# Patient Record
Sex: Female | Born: 1960
Health system: Southern US, Community
[De-identification: ages and names within clinical notes are randomized; demographics above are authoritative.]

## PROBLEM LIST (undated history)

## (undated) DIAGNOSIS — I10 Essential (primary) hypertension: Secondary | ICD-10-CM

## (undated) DIAGNOSIS — M1990 Unspecified osteoarthritis, unspecified site: Secondary | ICD-10-CM

## (undated) DIAGNOSIS — R609 Edema, unspecified: Secondary | ICD-10-CM

## (undated) DIAGNOSIS — Z923 Personal history of irradiation: Secondary | ICD-10-CM

## (undated) DIAGNOSIS — T7840XA Allergy, unspecified, initial encounter: Secondary | ICD-10-CM

## (undated) DIAGNOSIS — R011 Cardiac murmur, unspecified: Secondary | ICD-10-CM

## (undated) DIAGNOSIS — Z87442 Personal history of urinary calculi: Secondary | ICD-10-CM

## (undated) DIAGNOSIS — C801 Malignant (primary) neoplasm, unspecified: Secondary | ICD-10-CM

## (undated) DIAGNOSIS — F329 Major depressive disorder, single episode, unspecified: Secondary | ICD-10-CM

## (undated) DIAGNOSIS — Z8489 Family history of other specified conditions: Secondary | ICD-10-CM

## (undated) DIAGNOSIS — L982 Febrile neutrophilic dermatosis [Sweet]: Secondary | ICD-10-CM

## (undated) DIAGNOSIS — F32A Depression, unspecified: Secondary | ICD-10-CM

## (undated) HISTORY — DX: Essential (primary) hypertension: I10

## (undated) HISTORY — DX: Edema, unspecified: R60.9

## (undated) HISTORY — DX: Allergy, unspecified, initial encounter: T78.40XA

## (undated) HISTORY — DX: Depression, unspecified: F32.A

## (undated) HISTORY — DX: Unspecified osteoarthritis, unspecified site: M19.90

## (undated) HISTORY — PX: ABDOMINAL HYSTERECTOMY: SHX81

## (undated) HISTORY — DX: Malignant (primary) neoplasm, unspecified: C80.1

## (undated) HISTORY — PX: BREAST SURGERY: SHX581

## (undated) HISTORY — PX: APPENDECTOMY: SHX54

## (undated) HISTORY — DX: Major depressive disorder, single episode, unspecified: F32.9

---

## 2000-04-01 ENCOUNTER — Other Ambulatory Visit: Admission: RE | Admit: 2000-04-01 | Discharge: 2000-04-01 | Payer: Self-pay | Admitting: Obstetrics and Gynecology

## 2000-06-01 ENCOUNTER — Encounter: Payer: Self-pay | Admitting: Obstetrics and Gynecology

## 2000-06-07 ENCOUNTER — Inpatient Hospital Stay (HOSPITAL_COMMUNITY): Admission: RE | Admit: 2000-06-07 | Discharge: 2000-06-08 | Payer: Self-pay | Admitting: Obstetrics and Gynecology

## 2000-06-07 ENCOUNTER — Encounter (INDEPENDENT_AMBULATORY_CARE_PROVIDER_SITE_OTHER): Payer: Self-pay

## 2001-06-22 DIAGNOSIS — Z923 Personal history of irradiation: Secondary | ICD-10-CM

## 2001-06-22 HISTORY — PX: BREAST LUMPECTOMY: SHX2

## 2001-06-22 HISTORY — DX: Personal history of irradiation: Z92.3

## 2001-08-10 ENCOUNTER — Encounter: Admission: RE | Admit: 2001-08-10 | Discharge: 2001-08-10 | Payer: Self-pay

## 2001-08-12 ENCOUNTER — Encounter (INDEPENDENT_AMBULATORY_CARE_PROVIDER_SITE_OTHER): Payer: Self-pay | Admitting: *Deleted

## 2001-08-12 ENCOUNTER — Encounter: Admission: RE | Admit: 2001-08-12 | Discharge: 2001-08-12 | Payer: Self-pay

## 2001-08-12 ENCOUNTER — Ambulatory Visit (HOSPITAL_BASED_OUTPATIENT_CLINIC_OR_DEPARTMENT_OTHER): Admission: RE | Admit: 2001-08-12 | Discharge: 2001-08-12 | Payer: Self-pay

## 2001-08-22 ENCOUNTER — Ambulatory Visit: Admission: RE | Admit: 2001-08-22 | Discharge: 2001-11-20 | Payer: Self-pay | Admitting: *Deleted

## 2001-08-26 ENCOUNTER — Encounter (INDEPENDENT_AMBULATORY_CARE_PROVIDER_SITE_OTHER): Payer: Self-pay | Admitting: *Deleted

## 2001-08-26 ENCOUNTER — Other Ambulatory Visit: Admission: RE | Admit: 2001-08-26 | Discharge: 2001-08-26 | Payer: Self-pay | Admitting: *Deleted

## 2002-08-10 ENCOUNTER — Encounter: Payer: Self-pay | Admitting: Family Medicine

## 2002-08-10 ENCOUNTER — Encounter: Admission: RE | Admit: 2002-08-10 | Discharge: 2002-08-10 | Payer: Self-pay | Admitting: Family Medicine

## 2002-12-08 ENCOUNTER — Ambulatory Visit (HOSPITAL_BASED_OUTPATIENT_CLINIC_OR_DEPARTMENT_OTHER): Admission: RE | Admit: 2002-12-08 | Discharge: 2002-12-08 | Payer: Self-pay

## 2002-12-08 ENCOUNTER — Encounter (INDEPENDENT_AMBULATORY_CARE_PROVIDER_SITE_OTHER): Payer: Self-pay | Admitting: *Deleted

## 2003-08-31 ENCOUNTER — Other Ambulatory Visit: Admission: RE | Admit: 2003-08-31 | Discharge: 2003-08-31 | Payer: Self-pay | Admitting: Internal Medicine

## 2003-09-06 ENCOUNTER — Encounter: Admission: RE | Admit: 2003-09-06 | Discharge: 2003-09-06 | Payer: Self-pay | Admitting: Internal Medicine

## 2003-09-12 ENCOUNTER — Encounter: Admission: RE | Admit: 2003-09-12 | Discharge: 2003-09-12 | Payer: Self-pay | Admitting: Internal Medicine

## 2003-10-04 ENCOUNTER — Encounter: Admission: RE | Admit: 2003-10-04 | Discharge: 2003-10-04 | Payer: Self-pay | Admitting: Internal Medicine

## 2004-09-02 ENCOUNTER — Other Ambulatory Visit: Admission: RE | Admit: 2004-09-02 | Discharge: 2004-09-02 | Payer: Self-pay | Admitting: Internal Medicine

## 2004-10-13 ENCOUNTER — Encounter: Admission: RE | Admit: 2004-10-13 | Discharge: 2004-10-13 | Payer: Self-pay | Admitting: Internal Medicine

## 2004-10-27 ENCOUNTER — Encounter: Admission: RE | Admit: 2004-10-27 | Discharge: 2004-10-27 | Payer: Self-pay | Admitting: Internal Medicine

## 2005-10-01 ENCOUNTER — Other Ambulatory Visit: Admission: RE | Admit: 2005-10-01 | Discharge: 2005-10-01 | Payer: Self-pay | Admitting: Internal Medicine

## 2005-10-21 ENCOUNTER — Encounter: Admission: RE | Admit: 2005-10-21 | Discharge: 2005-10-21 | Payer: Self-pay | Admitting: Internal Medicine

## 2006-08-06 ENCOUNTER — Encounter: Admission: RE | Admit: 2006-08-06 | Discharge: 2006-08-06 | Payer: Self-pay | Admitting: Internal Medicine

## 2007-01-20 ENCOUNTER — Other Ambulatory Visit: Admission: RE | Admit: 2007-01-20 | Discharge: 2007-01-20 | Payer: Self-pay | Admitting: Internal Medicine

## 2007-03-15 ENCOUNTER — Encounter: Admission: RE | Admit: 2007-03-15 | Discharge: 2007-03-15 | Payer: Self-pay | Admitting: Internal Medicine

## 2007-10-25 ENCOUNTER — Encounter: Admission: RE | Admit: 2007-10-25 | Discharge: 2007-10-25 | Payer: Self-pay | Admitting: Internal Medicine

## 2008-03-15 ENCOUNTER — Encounter: Admission: RE | Admit: 2008-03-15 | Discharge: 2008-03-15 | Payer: Self-pay | Admitting: Internal Medicine

## 2008-03-19 ENCOUNTER — Other Ambulatory Visit: Admission: RE | Admit: 2008-03-19 | Discharge: 2008-03-19 | Payer: Self-pay | Admitting: Internal Medicine

## 2008-09-05 ENCOUNTER — Ambulatory Visit: Payer: Self-pay | Admitting: Internal Medicine

## 2008-10-02 ENCOUNTER — Ambulatory Visit: Payer: Self-pay | Admitting: Internal Medicine

## 2008-12-10 ENCOUNTER — Ambulatory Visit: Payer: Self-pay | Admitting: Internal Medicine

## 2009-03-18 ENCOUNTER — Encounter: Admission: RE | Admit: 2009-03-18 | Discharge: 2009-03-18 | Payer: Self-pay | Admitting: Internal Medicine

## 2009-04-11 ENCOUNTER — Ambulatory Visit: Payer: Self-pay | Admitting: Internal Medicine

## 2009-09-16 ENCOUNTER — Encounter: Admission: RE | Admit: 2009-09-16 | Discharge: 2009-09-16 | Payer: Self-pay | Admitting: Internal Medicine

## 2009-09-16 ENCOUNTER — Ambulatory Visit: Payer: Self-pay | Admitting: Internal Medicine

## 2009-09-17 ENCOUNTER — Encounter: Admission: RE | Admit: 2009-09-17 | Discharge: 2009-09-17 | Payer: Self-pay | Admitting: Internal Medicine

## 2010-01-21 ENCOUNTER — Ambulatory Visit: Payer: Self-pay | Admitting: Internal Medicine

## 2010-01-21 ENCOUNTER — Encounter: Payer: Self-pay | Admitting: Internal Medicine

## 2010-01-21 ENCOUNTER — Ambulatory Visit: Admission: RE | Admit: 2010-01-21 | Discharge: 2010-01-21 | Payer: Self-pay | Admitting: Internal Medicine

## 2010-01-21 ENCOUNTER — Ambulatory Visit: Payer: Self-pay | Admitting: Vascular Surgery

## 2010-02-20 HISTORY — PX: KNEE ARTHROSCOPY: SUR90

## 2010-06-02 ENCOUNTER — Ambulatory Visit: Payer: Self-pay | Admitting: Internal Medicine

## 2010-10-21 HISTORY — PX: LAPAROSCOPIC APPENDECTOMY: SUR753

## 2010-11-07 NOTE — Op Note (Signed)
[facility]  Patient:    Krystal Kane, Krystal Kane [facility]                        MRN: [id number] Proc. Date: 06/07/00 Adm. Date:  [id number] Attending:  Malon Kindle                           Operative Report  PREOPERATIVE DIAGNOSES:  Menorrhagia, dyspareunia, presumed adenomyosis.  POSTOPERATIVE DIAGNOSES:  Menorrhagia, dyspareunia, presumed adenomyosis.  OPERATION PERFORMED:  Vaginal hysterectomy.  SURGEON:  Dr. Ambrose Mantle.  ASSISTANT:  Dr. Jackelyn Knife.  ANESTHESIA:  General.  DESCRIPTION OF PROCEDURE:  The patient was brought to the operating room and placed under satisfactory general anesthesia and placed in lithotomy position. Exam revealed the uterus to be posterior normal size. The adnexa were free of masses. The uterus may have been upper limit of normal size. The lower abdomen, vulva, vagina, and urethra were prepped with Betadine solution. A Foley catheter was inserted to straight drain and then the vulva was draped as a sterile field. The cervix was drawn into the operative field. A dilute solution of Neo-Synephrine was injected all around the cervix at the cervicovaginal junction. An incision was made at this area. The bladder was pushed ahead and then the posterior cul-de-sac was identified and entered by sharp dissection. Both uterosacral ligaments were clamped, cut and suture ligated as were the cardinal ligaments. I advanced the bladder more, clamped, cut, and suture ligated the uterine vessels, entered the anterior peritoneal cavity, continued the bites along the broad ligaments bilaterally and then inverted the uterus through the incision in the cul-de-sac, clamped, the upper pedicles and removed the uterus. The upper pedicles were doubly suture ligated. A diligent search for hemostasis was made. Part of the bleeding was coming from the posterior cuff which I then sutured with three figure-of-eight sutures of #0 Vicryl.  There was only a small  amount of bleeding otherwise. I placed a pursestring suture through the anterior peritoneum, the upper pedicles, the uterosacral ligaments and the posterior peritoneum. Before I tied it down, I searched again for hemostasis. Both ovaries and tubes were normal. There were small areas of bleeding that I bovied I did require a couple of extra sutures along the broad ligaments. At this point, hemostasis was complete. I tied the pursestring suture down obliterating the peritoneal cavity. I then sutured the uterosacral ligaments together in the midline extraperitoneally, closed the vaginal cuff with multiple interrupted figure-of-eight sutures of #0 Vicryl in a vertical fashion. Blood loss was thought to be about 200 cc. Before the closure of the peritoneal cavity, I did inflate the bladder with 200 cc of methylene blue stone fluid, there was no leakage. The patient seemed to tolerate the procedure well and was returned to recovery in satisfactory condition. DD:  06/07/00 TD:  06/08/00 Job: [id number]

## 2010-11-07 NOTE — H&P (Signed)
[facility]  Patient:    Krystal Kane, Krystal Kane [facility]                        MRN: [id number] Adm. Date:  06/07/00 Attending:  Malachi Pro. Ambrose Mantle, M.D.                         History and Physical  HISTORY OF PRESENT ILLNESS:  This is a 50 year old white female, para 3-0-1-3, who is admitted to the hospital for hysterectomy because of menorrhagia, dyspareunia, and probable adenomyosis.  Last menstrual period was June 01, 2000.  Patients periods occur at 3-4 week intervals.  The last 3-5 days she claims they are heavier than in the past.  Changes pad, a super tampon every two hours the first day.  She states that her bleeding has been becoming heavier and she reports dyspareunia has increased over the last 6-7 months. On examination her uterus is tender to motion and somewhat enlarged.  The patient has tried birth control pills in the past, felt like they were related to her migraine headaches and yeast infections.  At the present time I have informed her that the goals of surgery are to eliminate the menorrhagia and hopefully to improve the dyspareunia.  I have spoken with the physician peer to peer for med cost and she has approved the surgery.  PAST MEDICAL HISTORY:  Reveals no known drug allergies.  OPERATIONS:  None.  ILLNESSES:  She has had a kidney stone.  She has been treated for high blood pressure.  She does not drink, does not smoke.  REVIEW OF SYSTEMS:  Noncontributory.  MEDICATIONS:  Hydrochlorothiazide.  FAMILY HISTORY:  Mother about 56 with breast cancer.  Father died at 39 from complications of a vasectomy.  Two sisters and two brothers are living and well.  PHYSICAL EXAMINATION:  GENERAL:  Reveals a well-developed slightly obese white female no distress, weight 153-1/2 pounds.  VITAL PRESSURE:  Blood pressure 120/80 and pulse 70.  HEENT:  Reveal no cranial abnormalities.  Extraocular movements are intact. Nose and pharynx clear.  NECK:   Neck supple without thyromegaly.  HEART:  Normal size and sounds, no murmurs.  LUNGS:  Clear to P&A.  BREASTS:  Soft without masses lying down or sitting up.  ABDOMEN:  Soft, nontender.  No masses are palpable.  The liver, spleen, and kidney are not felt.  PELVIC:  Reveals that on April 02, 2000, the patient had a normal Pap smear. The vulva and vagina are clean.  BUS negative, cervix clean, uterus third degree retroverted, upper limit of normal size, tender to motion.  Adnexae are clear.  ADMITTING IMPRESSION:  Menorrhagia, dyspareunia, probable adenomyosis.  The patient is admitted for hysterectomy.  We will try vaginally and if unsuccessful, go abdominally.  The patient has been informed of the risks for surgery, including, but not limited to, heart attacks, strokes, pulmonary embolus, wound disruption, hemorrhage with need for reoperation, and/or transfusion, fistula formation, nerve injury, and intestinal obstruction.  She understands and agrees to proceed. DD:  06/06/00 TD:  06/06/00 Job: [id number]

## 2010-11-07 NOTE — Op Note (Signed)
   NAME:  KEVIN, MARIO NO.:  [id number]   MEDICAL RECORD NO.:  [id number]                   PATIENT TYPE:  AMB   LOCATION:  [facility]SC                                  FACILITY:  [facility]   PHYSICIAN:  Lorre Munroe., M.D.            DATE OF BIRTH:  05-Mar-1961   DATE OF PROCEDURE:  12/08/2002  DATE OF DISCHARGE:  12/08/2002                                 OPERATIVE REPORT   PREOPERATIVE DIAGNOSIS:  Right breast mass.   POSTOPERATIVE DIAGNOSIS:  Right breast mass.   OPERATION:  Right breast biopsy.   SURGEON:  Lebron Conners, M.D.   ANESTHESIA:  Local with sedation.   DESCRIPTION OF PROCEDURE:  After the patient was    He says to discard this dictation because he looked in his record and saw  that he had dictation number [id number] and that needs to be found and placed on  the record and to let him know if it can not be located.                                               Lorre Munroe., M.D.    WB/MEDQ  D:  01/03/2003  T:  01/03/2003  Job:  [id number]

## 2010-11-07 NOTE — Discharge Summary (Signed)
[facility]  Patient:    Krystal Kane, Krystal Kane [facility]                        MRN: [id number] Adm. Date:  [id number] Disc. Date: [id number] Attending:  Malon Kindle                           Discharge Summary  REASON FOR ADMISSION:  A 50 year old white female with menorrhagia, dyspareunia, and presumed adenomyosis admitted for vaginal hysterectomy.  HOSPITAL COURSE:  The patient underwent the procedure by Dr. Ambrose Mantle with Dr. Jackelyn Knife assisting under general anesthesia on June 07, 2000.  Blood loss was about 200 cc.  One vaginal pack was left.  It was removed on the first postop day.  The patient did quite well.  She had no significant pain.  She was ambulating well without difficulty.  Her abdomen was soft and nontender, and on the first postop day she was discharged.  She did have a fever to 100.5 at 4 p.m. on the first day postop, but basically had no symptoms, and she is going to be monitored as an outpatient.  LABORATORY AND X-RAY DATA:  On admission her white count was 5500, hemoglobin 13.1, hematocrit 37.2, platelet count 274,000, 59 segs, 31, lymphs, 5 monos, 1 eosinophil.  Comprehensive metabolic profile was normal.  Urinalysis was negative.  Chest x-ray showed no active disease.  EKG was normal sinus rhythm, nonspecific ST and T wave abnormality.  Path report is not available.  FINAL DIAGNOSES: 1. Menorrhagia. 2. Dyspareunia. 3. Presumed adenomyosis.  MEDICATIONS AT DISCHARGE: 1. Tylenol No. 3 one q.3-4h. p.r.n. pain. 2. Allegra 60 mg one q.a.m.  Resume her other medications: 1. Hydrochlorothiazide 25 mg one p.o. q.a.m.  SPECIAL DISCHARGE INSTRUCTIONS:  She is to take her temperature at 8 p.m. tonight, at bedtime, and in the morning.  It should be noted that her temperature on her preop exam was 99.5 degrees.  She is encouraged to cough and deep breathe, to avoid vaginal entrance, and to call me on June 09, 2000, with a summary of  her temperatures.  No antibiotics are given at discharge. DD:  06/08/00 TD:  06/09/00 Job: [id number]

## 2010-11-07 NOTE — Op Note (Signed)
[facility]  Patient:    Krystal Kane, Krystal Kane Visit Number: [id number] MRN: [id number]          Service Type: DSU Location: [facility] Attending Physician:  Meredith Leeds Dictated by:   Zigmund Daniel, M.D. Proc. Date: 08/12/01 Admit Date:  08/12/2001                             Operative Report  PREOPERATIVE DIAGNOSIS:  Carcinoma of the right breast.  POSTOPERATIVE DIAGNOSIS:  Carcinoma of the right breast.  PROCEDURE:  Right partial mastectomy and right axillary sentinel lymph node biopsy.  SURGEON:  Zigmund Daniel, M.D.  ANESTHESIA:  General.  DESCRIPTION OF PROCEDURE:  After the patient was monitored and anesthetized with laryngeal mask airway and had routine preparation and draping of the right breast and axilla, I checked for radioactivity in the axilla.  I had injected 3 cc of blue dye beneath the nipple and areola prior to prep.  That had been massaged for five minutes.  I found a quite radioactive area in the axilla and made an incision about 2.5 cm long over it and dissected toward it and found three lymph nodes which were highly radioactive and stained blue. The remainder of the axilla then had background level of radiation.  I sent the lymph nodes for touch prep analysis and proceeded with the partial mastectomy during that period of time.  Subsequently I received the report that all of the touch preps were negative.  There were no palpably enlarged lymph nodes in the axilla.  I closed the skin with intracuticular 4-0 Vicryl and Steri-Strips.  I made a curvilinear incision at the site of wire entry and then more medial to it, since the wire was put in from medial to lateral, and dissected skin flaps over the area where I felt the tumor would be.  I then deepened the tissue all around, estimating that I would get at least a centimeter or two of normal breast margin all the way around.  I deepened the incision down to the  pectoral fascia and then began to excise the specimen. As I came across the specimen from inferior to superior, I found that the localizing wire went into the pectoral muscle.  I dissected off a little bit of the pectoral fascia and muscle with it and then removed it on out and completed the excision of the specimen.  I felt I had dissected definitely medial to the end of the localizing wire.  I then inspected the remaining cavity after getting good hemostasis and found one other somewhat suspicious area of tissue, and I excised that with a good margin of normal tissue.  I then again got hemostasis, irrigated the wound, and removed the irrigant.  I did not receive a report from the radiologist, as the machines that were faxing the radiograph to her were not working properly.  However, I inspected the radiograph myself and felt that the abnormality was excised.  The wound was closed with running intracuticular 4-0 Vicryl and Steri-Strips after getting good hemostasis.  Sponge, needle, and instrument counts were correct. Dictated by:   Zigmund Daniel, M.D. Attending Physician:  Meredith Leeds DD:  08/12/01 TD:  08/13/01 Job: [id number]

## 2010-11-07 NOTE — Op Note (Signed)
   NAME:  Krystal Kane, Krystal Kane NO.:  [id number]   MEDICAL RECORD NO.:  [id number]                   PATIENT TYPE:  AMB   LOCATION:  [facility]                                  FACILITY:  [facility]   PHYSICIAN:  Lorre Munroe., M.D.            DATE OF BIRTH:  1961-03-29   DATE OF PROCEDURE:  12/08/2002  DATE OF DISCHARGE:                                 OPERATIVE REPORT   PREOPERATIVE DIAGNOSIS:  Right breast mass and scar.   POSTOPERATIVE DIAGNOSIS:  Right breast mass and scar.   OPERATION PERFORMED:  Excision of mass and revision of scar.   SURGEON:  Lebron Conners, M.D.   ANESTHESIA:  Local with sedation.   DESCRIPTION OF PROCEDURE:  After the patient was monitored and sedated by  the anesthesia service and had routine preparation and draping of the right  breast I marked the scar and location of the mass effect deep in the scar  tissue.  I then made an elliptical excision of scar at maximum width about  0.5 cm.  I then mobilized the skin and subcutaneous tissues toward the  areola and toward the axilla going through a fair amount of scar tissue and  giving good mobility of the skin.  I then excised the deeper scar tissue  which had a mass-like effect particularly as it was present just superficial  to the pectoral muscle.  I actually took a little bit of the pectoral fascia  off along with the scar tissue.  I did not cut through or encounter any  obvious cancer.  After excising all of the mass, I mobilized the deeper  tissues slightly and then got good hemostasis.  I used one 4-0 Vicryl suture  to approximate the deeper tissues and eliminated a good deal of the dimple  type effect that had been present previous.  After assuring good hemostasis  and infiltrating further local anesthetic for good postoperative pain  control, I closed the skin with running intracuticular 4-0 Vicryl and Steri-  Strips.  The cosmetic effect was good.  After application of  bandage, the  patient went to the PACU in good condition.                                                Lorre Munroe., M.D.    WB/MEDQ  D:  12/08/2002  T:  12/11/2002  Job:  [id number]

## 2010-12-02 ENCOUNTER — Ambulatory Visit: Payer: Self-pay | Admitting: Internal Medicine

## 2010-12-11 ENCOUNTER — Other Ambulatory Visit: Payer: BC Managed Care – PPO | Admitting: Internal Medicine

## 2010-12-11 DIAGNOSIS — I10 Essential (primary) hypertension: Secondary | ICD-10-CM

## 2010-12-12 ENCOUNTER — Other Ambulatory Visit: Payer: BC Managed Care – PPO | Admitting: Internal Medicine

## 2010-12-12 LAB — BASIC METABOLIC PANEL WITH GFR
BUN: 15 mg/dL (ref 6–23)
CO2: 26 meq/L (ref 19–32)
Calcium: 9.2 mg/dL (ref 8.4–10.5)
Chloride: 105 meq/L (ref 96–112)
Creat: 0.76 mg/dL (ref 0.50–1.10)
Glucose, Bld: 92 mg/dL (ref 70–99)
Potassium: 4.2 meq/L (ref 3.5–5.3)
Sodium: 143 meq/L (ref 135–145)

## 2010-12-15 ENCOUNTER — Ambulatory Visit (INDEPENDENT_AMBULATORY_CARE_PROVIDER_SITE_OTHER): Payer: BC Managed Care – PPO | Admitting: Internal Medicine

## 2010-12-15 ENCOUNTER — Encounter: Payer: Self-pay | Admitting: Internal Medicine

## 2010-12-15 VITALS — BP 130/82 | HR 72 | Temp 97.7°F | Ht 63.25 in | Wt 205.0 lb

## 2010-12-15 DIAGNOSIS — F32A Depression, unspecified: Secondary | ICD-10-CM | POA: Insufficient documentation

## 2010-12-15 DIAGNOSIS — M778 Other enthesopathies, not elsewhere classified: Secondary | ICD-10-CM

## 2010-12-15 DIAGNOSIS — Z853 Personal history of malignant neoplasm of breast: Secondary | ICD-10-CM | POA: Insufficient documentation

## 2010-12-15 DIAGNOSIS — F3289 Other specified depressive episodes: Secondary | ICD-10-CM

## 2010-12-15 DIAGNOSIS — E669 Obesity, unspecified: Secondary | ICD-10-CM

## 2010-12-15 DIAGNOSIS — I10 Essential (primary) hypertension: Secondary | ICD-10-CM

## 2010-12-15 DIAGNOSIS — F329 Major depressive disorder, single episode, unspecified: Secondary | ICD-10-CM

## 2010-12-15 DIAGNOSIS — M65839 Other synovitis and tenosynovitis, unspecified forearm: Secondary | ICD-10-CM

## 2010-12-15 DIAGNOSIS — R635 Abnormal weight gain: Secondary | ICD-10-CM

## 2010-12-15 DIAGNOSIS — M25569 Pain in unspecified knee: Secondary | ICD-10-CM

## 2010-12-15 DIAGNOSIS — M25561 Pain in right knee: Secondary | ICD-10-CM

## 2010-12-15 NOTE — Patient Instructions (Signed)
Place ice on reassessed for 20 minutes twice daily. Wear wrist splint during the day. Please get mammogram. Continue same blood pressure medications. Return to [employer]. Try to lose 20 pounds in the next 6 months. May need to see orthopedist regarding reassessed. Recommend seeing orthopedist regarding persistent right knee pain.

## 2010-12-15 NOTE — Progress Notes (Signed)
  Subjective:    Patient ID: Krystal Kane, female    DOB: 1961/01/30, 50 y.o.   MRN: [id number]  HPI   In today for six-month recheck. She has gained 21 pounds since March 2011. Says that she had knee surgery for a right knee torn meniscus by Dr. Magnus Ivan in September 2011. Knee still hurts. Also complaining of right wrist pain after curing some heavy items to her mother's home a couple of months ago. She did not fall on her wrist just carried a lot of heavy items. Pain in medial aspect right wrist. Hurts to move thumb. Also, says blood pressure has been elevated. She had an appendectomy at [facility] location this past May. Her daughter also got married in early June. She's been under some stress with both things going on. Blood pressure today is acceptable on current regimen. The basic metabolic panel reviewed and is within normal limits. Some ENT nest syndrome going on with her. Tearful when discussing this. Middle daughter has graduated from high school and will be attending nursing school at  [employer]. Has one daughter age 52 left at home. Has not had recent mammogram as recommended. Order given today for mammogram. Advised patient to return to orthopedist regarding her knee and possibly her wrist.    Review of Systems     Objective:   Physical Exam neck supple without thyromegaly or carotid bruits; chest clear to auscultation; cardiac exam regular rate and rhythm; extremities without edema        Assessment & Plan:  Hypertension continue with Lasix and ramipril recheck in 6 months  Depression continue with Wellbutrin  Right wrist tendinitis-recommend ice wrist 20 minutes twice daily and wear right wrist splint during the day. Right knee pain status post surgery for torn meniscus  History of breast cancer-needs mammogram  Plan BMET reviewed and is within normal limits. Schedule physical examination January 2013. Patient agrees she should go back to [employer]. Agrees she should go back orthopedist regarding persistent right knee pain. May need to see orthopedist regarding right wrist if it does not respond to ice& wrist splint.

## 2010-12-19 ENCOUNTER — Other Ambulatory Visit: Payer: Self-pay | Admitting: Internal Medicine

## 2011-01-19 ENCOUNTER — Other Ambulatory Visit: Payer: Self-pay | Admitting: Internal Medicine

## 2011-01-19 DIAGNOSIS — Z1231 Encounter for screening mammogram for malignant neoplasm of breast: Secondary | ICD-10-CM

## 2011-01-28 ENCOUNTER — Ambulatory Visit: Payer: BC Managed Care – PPO

## 2011-02-04 ENCOUNTER — Ambulatory Visit
Admission: RE | Admit: 2011-02-04 | Discharge: 2011-02-04 | Disposition: A | Discharge status: Home / Self Care | Payer: BC Managed Care – PPO | Source: Ambulatory Visit | Attending: Internal Medicine | Admitting: Internal Medicine

## 2011-02-04 DIAGNOSIS — Z1231 Encounter for screening mammogram for malignant neoplasm of breast: Secondary | ICD-10-CM

## 2011-06-19 ENCOUNTER — Other Ambulatory Visit: Payer: Self-pay | Admitting: Internal Medicine

## 2011-07-06 ENCOUNTER — Other Ambulatory Visit: Payer: BC Managed Care – PPO | Admitting: Internal Medicine

## 2011-07-06 DIAGNOSIS — Z0000 Encounter for general adult medical examination without abnormal findings: Secondary | ICD-10-CM

## 2011-07-06 DIAGNOSIS — I10 Essential (primary) hypertension: Secondary | ICD-10-CM

## 2011-07-06 LAB — CBC WITH DIFFERENTIAL/PLATELET
Basophils Absolute: 0 10*3/uL (ref 0.0–0.1)
Basophils Relative: 0 % (ref 0–1)
Eosinophils Absolute: 0.1 10*3/uL (ref 0.0–0.7)
Eosinophils Relative: 1 % (ref 0–5)
HCT: 40.3 % (ref 36.0–46.0)
MCH: 31.4 pg (ref 26.0–34.0)
MCHC: 32.8 g/dL (ref 30.0–36.0)
MCV: 96 fL (ref 78.0–100.0)
Monocytes Absolute: 0.7 10*3/uL (ref 0.1–1.0)
Monocytes Relative: 7 % (ref 3–12)
Neutro Abs: 7.4 10*3/uL (ref 1.7–7.7)
RDW: 13.2 % (ref 11.5–15.5)

## 2011-07-06 LAB — LIPID PANEL
Cholesterol: 175 mg/dL (ref 0–200)
Total CHOL/HDL Ratio: 4.1 Ratio
Triglycerides: 149 mg/dL (ref <150)
VLDL: 30 mg/dL (ref 0–40)

## 2011-07-06 LAB — COMPREHENSIVE METABOLIC PANEL
AST: 20 U/L (ref 0–37)
Alkaline Phosphatase: 78 U/L (ref 39–117)
BUN: 10 mg/dL (ref 6–23)
Creat: 0.56 mg/dL (ref 0.50–1.10)
Glucose, Bld: 98 mg/dL (ref 70–99)

## 2011-07-07 ENCOUNTER — Encounter: Payer: Self-pay | Admitting: Internal Medicine

## 2011-07-07 ENCOUNTER — Ambulatory Visit (INDEPENDENT_AMBULATORY_CARE_PROVIDER_SITE_OTHER): Payer: BC Managed Care – PPO | Admitting: Internal Medicine

## 2011-07-07 ENCOUNTER — Other Ambulatory Visit (HOSPITAL_COMMUNITY)
Admission: RE | Admit: 2011-07-07 | Discharge: 2011-07-07 | Disposition: A | Discharge status: Home / Self Care | Payer: BC Managed Care – PPO | Source: Ambulatory Visit | Attending: Internal Medicine | Admitting: Internal Medicine

## 2011-07-07 VITALS — BP 146/94 | HR 80 | Temp 98.9°F | Ht 63.5 in | Wt 205.0 lb

## 2011-07-07 DIAGNOSIS — Z23 Encounter for immunization: Secondary | ICD-10-CM

## 2011-07-07 DIAGNOSIS — I10 Essential (primary) hypertension: Secondary | ICD-10-CM

## 2011-07-07 DIAGNOSIS — M1710 Unilateral primary osteoarthritis, unspecified knee: Secondary | ICD-10-CM

## 2011-07-07 DIAGNOSIS — F411 Generalized anxiety disorder: Secondary | ICD-10-CM

## 2011-07-07 DIAGNOSIS — Z01419 Encounter for gynecological examination (general) (routine) without abnormal findings: Secondary | ICD-10-CM | POA: Insufficient documentation

## 2011-07-07 DIAGNOSIS — Z0000 Encounter for general adult medical examination without abnormal findings: Secondary | ICD-10-CM

## 2011-07-07 DIAGNOSIS — F419 Anxiety disorder, unspecified: Secondary | ICD-10-CM

## 2011-07-07 DIAGNOSIS — M170 Bilateral primary osteoarthritis of knee: Secondary | ICD-10-CM

## 2011-07-07 DIAGNOSIS — Z124 Encounter for screening for malignant neoplasm of cervix: Secondary | ICD-10-CM

## 2011-07-07 DIAGNOSIS — Z87442 Personal history of urinary calculi: Secondary | ICD-10-CM

## 2011-07-07 DIAGNOSIS — Z853 Personal history of malignant neoplasm of breast: Secondary | ICD-10-CM

## 2011-07-07 DIAGNOSIS — Z8659 Personal history of other mental and behavioral disorders: Secondary | ICD-10-CM

## 2011-07-07 DIAGNOSIS — IMO0002 Reserved for concepts with insufficient information to code with codable children: Secondary | ICD-10-CM

## 2011-07-07 LAB — POCT URINALYSIS DIPSTICK
Glucose, UA: NEGATIVE
Leukocytes, UA: NEGATIVE
Protein, UA: NEGATIVE
Spec Grav, UA: <=1.005
Urobilinogen, UA: NEGATIVE

## 2011-07-07 LAB — VITAMIN D 25 HYDROXY (VIT D DEFICIENCY, FRACTURES): Vit D, 25-Hydroxy: 39 ng/mL (ref 30–89)

## 2011-07-07 MED ORDER — TETANUS-DIPHTH-ACELL PERTUSSIS 5-2.5-18.5 LF-MCG/0.5 IM SUSP: 0.5000 mL | Freq: Once | INTRAMUSCULAR | Status: DC

## 2011-07-14 ENCOUNTER — Ambulatory Visit (INDEPENDENT_AMBULATORY_CARE_PROVIDER_SITE_OTHER): Payer: BC Managed Care – PPO | Admitting: Internal Medicine

## 2011-07-14 ENCOUNTER — Encounter: Payer: Self-pay | Admitting: Internal Medicine

## 2011-07-14 VITALS — BP 144/92 | HR 84 | Temp 98.3°F | Wt 201.5 lb

## 2011-07-14 DIAGNOSIS — I10 Essential (primary) hypertension: Secondary | ICD-10-CM

## 2011-07-14 DIAGNOSIS — Z853 Personal history of malignant neoplasm of breast: Secondary | ICD-10-CM

## 2011-07-14 DIAGNOSIS — F329 Major depressive disorder, single episode, unspecified: Secondary | ICD-10-CM

## 2011-07-14 DIAGNOSIS — F419 Anxiety disorder, unspecified: Secondary | ICD-10-CM

## 2011-07-14 DIAGNOSIS — F411 Generalized anxiety disorder: Secondary | ICD-10-CM

## 2011-07-14 DIAGNOSIS — F32A Depression, unspecified: Secondary | ICD-10-CM

## 2011-07-21 ENCOUNTER — Encounter: Payer: Self-pay | Admitting: Internal Medicine

## 2011-07-21 NOTE — Patient Instructions (Signed)
Continue Ramapril 10 mg daily. Add Norvasc 5 mg daily. Return in 4 weeks.

## 2011-07-21 NOTE — Progress Notes (Signed)
  Subjective:    Patient ID: Krystal Kane, female    DOB: December 30, 1960, 51 y.o.   MRN: [id number]  HPI patient was in recently for health maintenance exam. Lately, says blood pressures been significantly elevated despite being on [place] real 10 mg daily. His been taking blood pressure readings at home. Is concerned about this. Denies excessive stress or anxiety. History of anxiety depression treated with Wellbutrin. No chest pain. History of breast cancer several years ago. Has done well since that time.    Review of Systems     Objective:   Physical Exam neck supple no thyromegaly; no JVD or bruits. Chest clear to auscultation; cardiac exam regular rate and rhythm normal S1 and S2 without murmurs. Extremities without edema        Assessment & Plan:  Hypertension-elevated blood pressure despite being on Ramapril 10 mg daily  Anxiety  History depression  History of breast cancer  Plan: Add Norvasc 5 mg daily and return in 4 weeks. Continue diet and exercise. Watch sodium.

## 2011-08-06 ENCOUNTER — Ambulatory Visit (INDEPENDENT_AMBULATORY_CARE_PROVIDER_SITE_OTHER): Payer: BC Managed Care – PPO | Admitting: Internal Medicine

## 2011-08-06 ENCOUNTER — Encounter: Payer: Self-pay | Admitting: Internal Medicine

## 2011-08-06 VITALS — BP 130/94 | HR 92 | Temp 99.4°F | Wt 201.0 lb

## 2011-08-06 DIAGNOSIS — J069 Acute upper respiratory infection, unspecified: Secondary | ICD-10-CM

## 2011-08-06 DIAGNOSIS — J329 Chronic sinusitis, unspecified: Secondary | ICD-10-CM

## 2011-08-06 DIAGNOSIS — I10 Essential (primary) hypertension: Secondary | ICD-10-CM

## 2011-09-03 ENCOUNTER — Encounter: Payer: Self-pay | Admitting: Internal Medicine

## 2011-09-03 ENCOUNTER — Ambulatory Visit (INDEPENDENT_AMBULATORY_CARE_PROVIDER_SITE_OTHER): Payer: BC Managed Care – PPO | Admitting: Internal Medicine

## 2011-09-03 VITALS — BP 120/80 | HR 80 | Temp 98.3°F | Wt 206.0 lb

## 2011-09-03 DIAGNOSIS — F341 Dysthymic disorder: Secondary | ICD-10-CM

## 2011-09-03 DIAGNOSIS — F329 Major depressive disorder, single episode, unspecified: Secondary | ICD-10-CM

## 2011-09-03 DIAGNOSIS — F32A Depression, unspecified: Secondary | ICD-10-CM

## 2011-09-03 DIAGNOSIS — I10 Essential (primary) hypertension: Secondary | ICD-10-CM

## 2011-09-03 NOTE — Patient Instructions (Addendum)
Continue same medications as prescribed including amlodipine daily. Please be compliant with medications. Return summer 2013 for followup.

## 2011-09-03 NOTE — Progress Notes (Signed)
  Subjective:    Patient ID: Krystal Kane, female    DOB: 01/27/1961, 51 y.o.   MRN: [id number]  HPI on February 15, added amlodipine to blood pressure regimen. Patient is doing much better with that. However forgot to take her medication yesterday. Feels well with amlodipine and has no side effects. Has an appointment to be seen this coming summer for followup.    Review of Systems     Objective:   Physical Exam chest clear; cardiac exam: regular rate and rhythm; extremities without edema:        Assessment & Plan:  Hypertension  History of breast cancer  Anxiety depression  Plan: Keep appointment for this summer. Return as needed

## 2011-09-19 ENCOUNTER — Encounter: Payer: Self-pay | Admitting: Internal Medicine

## 2011-09-19 DIAGNOSIS — M1711 Unilateral primary osteoarthritis, right knee: Secondary | ICD-10-CM | POA: Insufficient documentation

## 2011-09-19 DIAGNOSIS — Z87442 Personal history of urinary calculi: Secondary | ICD-10-CM | POA: Insufficient documentation

## 2011-09-19 DIAGNOSIS — F419 Anxiety disorder, unspecified: Secondary | ICD-10-CM | POA: Insufficient documentation

## 2011-09-19 NOTE — Patient Instructions (Signed)
Add Norvasc to Altace and Lasix. Return in 3-4 weeks.

## 2011-09-19 NOTE — Progress Notes (Signed)
  Subjective:    Patient ID: Krystal Kane, female    DOB: 13-Feb-1961, 51 y.o.   MRN: [id number]  HPI patient recently started on Norvasc in addition Altace and Lasix for hypertension. Says blood pressure has markedly improved although it's elevated today. The reason it seems to be elevated today as she has been taking some over-the-counter decongestants for a bout of sinusitis. She has a lot of maxillary sinus tenderness and congestion. No fever or shaking chills.    Review of Systems     Objective:   Physical Exam HEENT exam: TMs are slightly full bilaterally; pharynx clear; neck supple without significant adenopathy. Chest clear. Cardiac exam regular rate and rhythm. Extremities without edema.        Assessment & Plan:  Sinusitis  Hypertension-improved with adding Norvasc to previous regimen of Altace and Lasix. Blood pressure rechecked 120/80 in office. She is to discontinue over-the-counter decongestants. These are contraindicated with hypertension. Zithromax Z-PAK take as directed.

## 2011-09-19 NOTE — Progress Notes (Signed)
Subjective:    Patient ID: Krystal Kane, female    DOB: Apr 21, 1961, 51 y.o.   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 Tumor was a poorly differentiated tumor with an associated area of microinvasion. PR/ER negative. HER-2/neu negative. Family history of breast cancer in mother at age 60. Maternal aunt died at age 81 of metastatic breast cancer. Chemotherapy was not recommended. She had radiation treatment and has done well. However all of this is caused considerable anxiety. She's been on antihypertensive medication since 2009. History of kidney stones. Had a bout of depression in 2007. History of pneumonia 2008. No known drug allergies.  Additional history: Has had a hysterectomy. Had appendectomy in May 2012 at [facility] in [place]. Right knee surgery for torn meniscus September 2011.  She has 3 daughters 2 of whom still live at home. She is married. 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 She is a graduate [employer]. Does not smoke or consume alcohol.  Family history father died at age 50 as a result of anesthesia when patient was 39 years old. Mother living in good health. 2 brothers in good health. One sister in good health and a half sibling which is a sister in good health.    Review of Systems  Constitutional: Positive for fatigue.  HENT: Negative.   Eyes: Negative.   Respiratory: Negative.   Cardiovascular:       Blood pressure is not been under good control lately  Gastrointestinal: Negative.   Genitourinary: Negative.   Musculoskeletal: Negative.   Neurological: Negative.   Hematological: Negative.   Psychiatric/Behavioral: Negative.        Objective:   Physical Exam  Vitals reviewed. Constitutional: She is oriented to  person, place, and time. She appears well-developed and well-nourished.  HENT:  Head: Normocephalic and atraumatic.  Right Ear: External ear normal.  Left Ear: External ear normal.  Nose: Nose normal.  Mouth/Throat: Oropharynx is clear and moist. No oropharyngeal exudate.  Eyes: Conjunctivae and EOM are normal. Pupils are equal, round, and reactive to light. Right eye exhibits no discharge. Left eye exhibits no discharge. No scleral icterus.  Neck: Neck supple. No JVD present. No thyromegaly present.  Abdominal: Soft. Bowel sounds are normal. She exhibits no distension and no mass. There is no rebound and no guarding.  Genitourinary:       Had Pap 2009. This was repeated today. History of hysterectomy.  Musculoskeletal: She exhibits no edema.  Lymphadenopathy:    She has no cervical adenopathy.  Neurological: She is alert and oriented to person, place, and time. She has normal reflexes. No cranial nerve deficit. Coordination normal.  Skin: Skin is warm and dry. No rash noted. She is not diaphoretic.  Psychiatric: She has a normal mood and affect. Her behavior is normal. Judgment and thought content normal.          Assessment & Plan:  Hypertension not as well-controlled as it was previously  Hypokalemia secondary to diuretic therapy  History of right ductal carcinoma in situ 2003 status post radiation treatment without further recurrence  History of anxiety and depression  Strong family history of breast cancer in mother and maternal aunt  Osteoarthritis of the knees  History of kidney stones  Plan: We'll add Norvasc 2 current regimen of Lasix and Altace  and have patient return 3-4 weeks.

## 2011-09-19 NOTE — Patient Instructions (Signed)
Continue Norvasc, Altace, and Lasix. Continue to watch blood pressure at home. Call if blood pressure elevates on a sustained basis. Take antibiotics as directed. Return in 6 months.

## 2011-11-09 ENCOUNTER — Other Ambulatory Visit: Payer: Self-pay

## 2011-11-09 MED ORDER — FUROSEMIDE 20 MG PO TABS: 20.0000 mg | ORAL_TABLET | Freq: Every day | ORAL | Status: DC

## 2011-11-09 MED ORDER — RAMIPRIL 10 MG PO CAPS: 10.0000 mg | ORAL_CAPSULE | Freq: Every day | ORAL | Status: DC

## 2011-12-17 ENCOUNTER — Other Ambulatory Visit: Payer: Self-pay | Admitting: Internal Medicine

## 2012-01-08 ENCOUNTER — Ambulatory Visit (INDEPENDENT_AMBULATORY_CARE_PROVIDER_SITE_OTHER): Payer: BC Managed Care – PPO | Admitting: Internal Medicine

## 2012-01-08 ENCOUNTER — Encounter: Payer: Self-pay | Admitting: Internal Medicine

## 2012-01-08 VITALS — BP 116/82 | HR 84 | Temp 97.7°F | Ht 63.0 in | Wt 209.0 lb

## 2012-01-08 DIAGNOSIS — I889 Nonspecific lymphadenitis, unspecified: Secondary | ICD-10-CM

## 2012-01-08 DIAGNOSIS — R5381 Other malaise: Secondary | ICD-10-CM

## 2012-01-08 DIAGNOSIS — H6593 Unspecified nonsuppurative otitis media, bilateral: Secondary | ICD-10-CM

## 2012-01-08 DIAGNOSIS — H6590 Unspecified nonsuppurative otitis media, unspecified ear: Secondary | ICD-10-CM

## 2012-01-08 DIAGNOSIS — R5383 Other fatigue: Secondary | ICD-10-CM

## 2012-01-08 DIAGNOSIS — I10 Essential (primary) hypertension: Secondary | ICD-10-CM

## 2012-01-08 DIAGNOSIS — Z853 Personal history of malignant neoplasm of breast: Secondary | ICD-10-CM

## 2012-01-08 LAB — CBC WITH DIFFERENTIAL/PLATELET
Basophils Absolute: 0 10*3/uL (ref 0.0–0.1)
Lymphocytes Relative: 26 % (ref 12–46)
Lymphs Abs: 2.2 10*3/uL (ref 0.7–4.0)
Neutro Abs: 5.5 10*3/uL (ref 1.7–7.7)
Neutrophils Relative %: 66 % (ref 43–77)
Platelets: 375 10*3/uL (ref 150–400)
RBC: 4.29 MIL/uL (ref 3.87–5.11)
RDW: 14 % (ref 11.5–15.5)
WBC: 8.3 10*3/uL (ref 4.0–10.5)

## 2012-01-08 LAB — BASIC METABOLIC PANEL
Chloride: 105 mEq/L (ref 96–112)
Creat: 0.65 mg/dL (ref 0.50–1.10)
Potassium: 3.7 mEq/L (ref 3.5–5.3)
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
## 2012-01-08 NOTE — Addendum Note (Signed)
Addended by: Judy Pimple on: 01/08/2012 10:29 AM   Modules accepted: Orders

## 2012-01-11 LAB — EPSTEIN-BARR VIRUS VCA ANTIBODY PANEL
EBV EA IgG: 6.2 U/mL (ref <9.0)
EBV VCA IgG: 471 U/mL — ABNORMAL HIGH (ref <18.0)
EBV VCA IgM: <10 U/mL (ref <36.0)

## 2012-04-20 ENCOUNTER — Other Ambulatory Visit: Payer: Self-pay | Admitting: Internal Medicine

## 2012-04-20 DIAGNOSIS — Z1231 Encounter for screening mammogram for malignant neoplasm of breast: Secondary | ICD-10-CM

## 2012-05-26 ENCOUNTER — Ambulatory Visit
Admission: RE | Admit: 2012-05-26 | Discharge: 2012-05-26 | Disposition: A | Discharge status: Home / Self Care | Payer: BC Managed Care – PPO | Source: Ambulatory Visit | Attending: Internal Medicine | Admitting: Internal Medicine

## 2012-05-26 DIAGNOSIS — Z1231 Encounter for screening mammogram for malignant neoplasm of breast: Secondary | ICD-10-CM

## 2012-07-12 ENCOUNTER — Other Ambulatory Visit: Payer: BC Managed Care – PPO | Admitting: Internal Medicine

## 2012-07-14 ENCOUNTER — Encounter: Payer: BC Managed Care – PPO | Admitting: Internal Medicine

## 2012-08-09 ENCOUNTER — Other Ambulatory Visit: Payer: BC Managed Care – PPO | Admitting: Internal Medicine

## 2012-08-09 DIAGNOSIS — Z0000 Encounter for general adult medical examination without abnormal findings: Secondary | ICD-10-CM

## 2012-08-09 LAB — TSH: TSH: 1.367 u[IU]/mL (ref 0.350–4.500)

## 2012-08-09 LAB — COMPREHENSIVE METABOLIC PANEL
ALT: 13 U/L (ref 0–35)
AST: 21 U/L (ref 0–37)
Alkaline Phosphatase: 95 U/L (ref 39–117)
BUN: 16 mg/dL (ref 6–23)
Creat: 0.69 mg/dL (ref 0.50–1.10)
Total Bilirubin: 0.3 mg/dL (ref 0.3–1.2)

## 2012-08-09 LAB — CBC WITH DIFFERENTIAL/PLATELET
Basophils Absolute: 0 10*3/uL (ref 0.0–0.1)
Basophils Relative: 0 % (ref 0–1)
Eosinophils Absolute: 0.1 10*3/uL (ref 0.0–0.7)
Eosinophils Relative: 2 % (ref 0–5)
Lymphocytes Relative: 33 % (ref 12–46)
MCH: 31.7 pg (ref 26.0–34.0)
MCHC: 34 g/dL (ref 30.0–36.0)
MCV: 93.2 fL (ref 78.0–100.0)
Platelets: 343 10*3/uL (ref 150–400)
RDW: 13.7 % (ref 11.5–15.5)
WBC: 6.4 10*3/uL (ref 4.0–10.5)

## 2012-08-09 LAB — LIPID PANEL
HDL: 53 mg/dL (ref >39)
Total CHOL/HDL Ratio: 4.4 Ratio
VLDL: 32 mg/dL (ref 0–40)

## 2012-08-10 LAB — VITAMIN D 25 HYDROXY (VIT D DEFICIENCY, FRACTURES): Vit D, 25-Hydroxy: 37 ng/mL (ref 30–89)

## 2012-08-11 ENCOUNTER — Encounter: Payer: Self-pay | Admitting: Internal Medicine

## 2012-08-11 ENCOUNTER — Ambulatory Visit (INDEPENDENT_AMBULATORY_CARE_PROVIDER_SITE_OTHER): Payer: BC Managed Care – PPO | Admitting: Internal Medicine

## 2012-08-11 VITALS — BP 140/96 | HR 80 | Temp 98.9°F | Ht 63.75 in | Wt 206.0 lb

## 2012-08-11 DIAGNOSIS — IMO0002 Reserved for concepts with insufficient information to code with codable children: Secondary | ICD-10-CM

## 2012-08-11 DIAGNOSIS — E785 Hyperlipidemia, unspecified: Secondary | ICD-10-CM

## 2012-08-11 DIAGNOSIS — Z87442 Personal history of urinary calculi: Secondary | ICD-10-CM

## 2012-08-11 DIAGNOSIS — I10 Essential (primary) hypertension: Secondary | ICD-10-CM

## 2012-08-11 DIAGNOSIS — M170 Bilateral primary osteoarthritis of knee: Secondary | ICD-10-CM

## 2012-08-11 DIAGNOSIS — E876 Hypokalemia: Secondary | ICD-10-CM

## 2012-08-11 DIAGNOSIS — M1710 Unilateral primary osteoarthritis, unspecified knee: Secondary | ICD-10-CM

## 2012-08-11 DIAGNOSIS — F411 Generalized anxiety disorder: Secondary | ICD-10-CM

## 2012-08-11 DIAGNOSIS — Z853 Personal history of malignant neoplasm of breast: Secondary | ICD-10-CM

## 2012-08-11 LAB — POCT URINALYSIS DIPSTICK
Bilirubin, UA: NEGATIVE
Blood, UA: NEGATIVE
Glucose, UA: NEGATIVE
Ketones, UA: NEGATIVE
Leukocytes, UA: NEGATIVE
Nitrite, UA: NEGATIVE
Protein, UA: NEGATIVE
Spec Grav, UA: 1.01
Urobilinogen, UA: NEGATIVE
pH, UA: 7.5

## 2012-09-12 ENCOUNTER — Other Ambulatory Visit: Payer: BC Managed Care – PPO | Admitting: Internal Medicine

## 2012-09-13 ENCOUNTER — Ambulatory Visit (INDEPENDENT_AMBULATORY_CARE_PROVIDER_SITE_OTHER): Payer: BC Managed Care – PPO | Admitting: Internal Medicine

## 2012-09-13 ENCOUNTER — Encounter: Payer: Self-pay | Admitting: Internal Medicine

## 2012-09-13 VITALS — BP 128/84 | HR 80 | Temp 98.6°F | Wt 211.0 lb

## 2012-09-13 DIAGNOSIS — E876 Hypokalemia: Secondary | ICD-10-CM

## 2012-09-13 NOTE — Patient Instructions (Addendum)
Continue Lasix in addition to other antihypertensive medication. Return in 6 months. Serum potassium checked today.

## 2012-09-13 NOTE — Progress Notes (Signed)
  Subjective:    Patient ID: Krystal Kane, female    DOB: [birth year], 52 y.o.   MRN: [id number]  HPI At last visit added Lasix 20 mg daily for additional blood pressure control. Blood pressure very acceptable today. Serum potassium checked. No complaints or problems. She's pleased with response with her blood pressure. Tolerating Lasix without issue.   Review of Systems     Objective:   Physical Exam Chest clear to auscultation. Cardiac exam regular rate and rhythm normal S1 and S2. Extremities without edema       Assessment & Plan:  Hypertension on 3 drug regimen  Return for six-month recheck October 2014. Will need b- met at that time.

## 2012-09-15 ENCOUNTER — Telehealth: Payer: Self-pay | Admitting: Internal Medicine

## 2012-09-15 NOTE — Telephone Encounter (Signed)
Noted  

## 2012-09-19 NOTE — Progress Notes (Signed)
Patient informed. Is already taking Potassium 20 meq daily. Recheck a bmet 10/18/2012.

## 2012-09-19 NOTE — Progress Notes (Signed)
Left message for patient to call.

## 2012-10-18 ENCOUNTER — Other Ambulatory Visit: Payer: BC Managed Care – PPO | Admitting: Internal Medicine

## 2012-10-18 NOTE — Progress Notes (Signed)
Patient came in for labwork today but admits she has not been compliant with her Potasssium supplements. Mom is terminal with breast cancer. Blood not drawn today. Advised to try to take medication every day and call us back in 2 weeks to schedule labwork then.

## 2012-10-27 ENCOUNTER — Other Ambulatory Visit: Payer: Self-pay | Admitting: Internal Medicine

## 2012-12-06 ENCOUNTER — Telehealth: Payer: Self-pay | Admitting: Internal Medicine

## 2012-12-06 NOTE — Telephone Encounter (Signed)
Pt is on Lasix 20 mg daily and does not need HCTZ. Does she not remember this from last visit?

## 2012-12-06 NOTE — Telephone Encounter (Signed)
Spoke with patient, who is somewhat confused about her medications. Will call her pharmacy to see when Amlodipine was last filled, as she is not presently taking it.

## 2013-01-01 NOTE — Patient Instructions (Addendum)
Continue same medications and return in 6 months 

## 2013-01-01 NOTE — Progress Notes (Signed)
Subjective:    Patient ID: Krystal Kane, female    DOB: Nov 02, 1960, 52 y.o.   MRN: [id number]  HPI 52 year old white female with history of hypertension, anxiety depression, history of breast cancer, history of kidney stones in today for health maintenance and evaluation of medical problems.  Has been on antihypertensive medication since 2009. History of pneumonia 2008. History of hysterectomy. Had appendectomy may 2012 at the [facility] in [place]. Surgery for right knee torn meniscus September 2011.  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 Tumor was a poorly differentiated tumor with an associated area of microinvasion. Tumor was PR/ER negative, HER-2/neu negative. Chemotherapy was not recommended. She had radiation treatment and has done well. However having breast cancer has caused considerable anxiety.  Social history: She has 3 daughters. She is married. 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 She is a graduate [employer]. Does not smoke or consume alcohol.  Family history: Father died at age 80 as a result of anesthesia when patient was 54 years old. Mother living with history of breast cancer at age 59. Maternal aunt died at age 76 of metastatic breast cancer. 2 brothers in good health. One sister in good health. Half sister in good health.    Review of Systems  Constitutional: Positive for fatigue.  HENT: Negative.   Eyes: Negative.   Respiratory: Negative.   Cardiovascular: Negative.   Gastrointestinal: Negative.   Endocrine: Negative.   Genitourinary:       History of kidney stone and  Musculoskeletal:       Osteoarthritis of the  Allergic/Immunologic: Negative.   Neurological: Negative.   Hematological: Negative.   Psychiatric/Behavioral:       History of anxiety       Objective:   Physical Exam  Vitals reviewed. Constitutional: She is oriented to  person, place, and time. She appears well-developed and well-nourished. No distress.  HENT:  Head: Normocephalic and atraumatic.  Left Ear: External ear normal.  Mouth/Throat: Oropharynx is clear and moist. No oropharyngeal exudate.  Eyes: Conjunctivae and EOM are normal. Pupils are equal, round, and reactive to light. Right eye exhibits no discharge. Left eye exhibits no discharge. No scleral icterus.  Neck: Neck supple. No JVD present. No thyromegaly present.  Cardiovascular: Normal rate, regular rhythm, normal heart sounds and intact distal pulses.   No murmur heard. Pulmonary/Chest: Effort normal and breath sounds normal. No respiratory distress. She has no wheezes. She has no rales. She exhibits no tenderness.  Breast without masses  Abdominal: Soft. Bowel sounds are normal. She exhibits no distension and no mass. There is no tenderness. There is no rebound and no guarding.  Genitourinary:  Pap done 2013. Bimanual normal. History of hysterectomy  Musculoskeletal: She exhibits no edema.  No effusion of the knees  Lymphadenopathy:    She has no cervical adenopathy.  Neurological: She is alert and oriented to person, place, and time. No cranial nerve deficit. Coordination normal.  Skin: Skin is warm and dry. No rash noted. She is not diaphoretic.  Psychiatric: She has a normal mood and affect. Her behavior is normal. Judgment and thought content normal.          Assessment & Plan:  Hypertension-stable  History of hypokalemia secondary to diuretic therapy  History of osteoarthritis of knees  History of kidney stones  History of breast cancer  History of anxiety  Plan: Return in 6  months. No change in medications. Needs screening colonoscopy.

## 2013-01-22 ENCOUNTER — Other Ambulatory Visit: Payer: Self-pay | Admitting: Internal Medicine

## 2013-02-05 ENCOUNTER — Other Ambulatory Visit: Payer: Self-pay | Admitting: Internal Medicine

## 2013-03-14 ENCOUNTER — Ambulatory Visit
Admission: RE | Admit: 2013-03-14 | Discharge: 2013-03-14 | Disposition: A | Discharge status: Home / Self Care | Payer: BC Managed Care – PPO | Source: Ambulatory Visit | Attending: Internal Medicine | Admitting: Internal Medicine

## 2013-03-14 ENCOUNTER — Other Ambulatory Visit (INDEPENDENT_AMBULATORY_CARE_PROVIDER_SITE_OTHER): Payer: BC Managed Care – PPO | Admitting: Internal Medicine

## 2013-03-14 DIAGNOSIS — R21 Rash and other nonspecific skin eruption: Secondary | ICD-10-CM

## 2013-03-14 LAB — CBC WITH DIFFERENTIAL/PLATELET
Basophils Absolute: 0 10*3/uL (ref 0.0–0.1)
Eosinophils Relative: 1 % (ref 0–5)
HCT: 39.3 % (ref 36.0–46.0)
Lymphocytes Relative: 25 % (ref 12–46)
Lymphs Abs: 2 10*3/uL (ref 0.7–4.0)
MCV: 90.3 fL (ref 78.0–100.0)
Neutro Abs: 5.4 10*3/uL (ref 1.7–7.7)
Platelets: 340 10*3/uL (ref 150–400)
RBC: 4.35 MIL/uL (ref 3.87–5.11)
RDW: 13.6 % (ref 11.5–15.5)
WBC: 8.2 10*3/uL (ref 4.0–10.5)

## 2013-03-14 LAB — COMPREHENSIVE METABOLIC PANEL
ALT: 12 U/L (ref 0–35)
CO2: 30 mEq/L (ref 19–32)
Calcium: 9.5 mg/dL (ref 8.4–10.5)
Chloride: 104 mEq/L (ref 96–112)
Creat: 0.71 mg/dL (ref 0.50–1.10)
Glucose, Bld: 99 mg/dL (ref 70–99)
Total Bilirubin: 0.5 mg/dL (ref 0.3–1.2)
Total Protein: 6.7 g/dL (ref 6.0–8.3)

## 2013-03-14 LAB — HEMOGLOBIN A1C: Hgb A1c MFr Bld: 5.7 % — ABNORMAL HIGH (ref <5.7)

## 2013-03-14 LAB — TSH: TSH: 2.166 u[IU]/mL (ref 0.350–4.500)

## 2013-03-14 NOTE — Patient Instructions (Addendum)
Lab work has been ordered. Please don't have chest x-ray today.

## 2013-03-14 NOTE — Progress Notes (Signed)
  Subjective:    Patient ID: Krystal Kane, female    DOB: January 16, 1961, 52 y.o.   MRN: [id number]  HPI Patient has large erythematous circular papules with red raised borders she says started after a number of mosquito bites. She's been going to dermatologist in [place] is been diagnosed with granuloma annulare. A biopsy was taken recently and results are pending. She's been placed on steroid creams and doxycycline. Dermatologist wanted her to have some additional lab work including ANA and comprehensive metabolic panel. She is due to come in early October to see me anyway. She wanted me to look at these lesions today and they do resemble granuloma annulare. In the have her obtain a chest x-ray. She has a history of breast cancer. I've also ordered a hemoglobin A1c and TSH.    Review of Systems     Objective:   Physical Exam multiple large lesions consistent with granuloma annulare on arms. Bruising noted left lower extremity.        Assessment & Plan:  Probable granuloma annulare  Plan: Await result of skin biopsy. See her  for regular appointment in early October. Chest x-ray ordered today and proved to be negative. Check for diabetes and lupus as well as hypothyroidism.

## 2013-03-14 NOTE — Progress Notes (Signed)
Patient advised.

## 2013-03-15 LAB — HIV ANTIBODY (ROUTINE TESTING W REFLEX): HIV: NONREACTIVE

## 2013-03-15 LAB — VITAMIN D 25 HYDROXY (VIT D DEFICIENCY, FRACTURES): Vit D, 25-Hydroxy: 51 ng/mL (ref 30–89)

## 2013-03-15 LAB — HIV 1/2 CONFIRMATION

## 2013-03-16 NOTE — Progress Notes (Signed)
Patient informed. 

## 2013-03-20 ENCOUNTER — Ambulatory Visit: Payer: BC Managed Care – PPO | Admitting: Internal Medicine

## 2013-03-23 ENCOUNTER — Ambulatory Visit: Payer: BC Managed Care – PPO | Admitting: Internal Medicine

## 2013-04-21 ENCOUNTER — Ambulatory Visit (INDEPENDENT_AMBULATORY_CARE_PROVIDER_SITE_OTHER): Payer: BC Managed Care – PPO | Admitting: Internal Medicine

## 2013-04-21 ENCOUNTER — Encounter: Payer: Self-pay | Admitting: Internal Medicine

## 2013-04-21 VITALS — BP 152/94 | HR 80 | Temp 97.0°F | Ht 62.0 in | Wt 193.0 lb

## 2013-04-21 DIAGNOSIS — E876 Hypokalemia: Secondary | ICD-10-CM

## 2013-04-21 DIAGNOSIS — F32A Depression, unspecified: Secondary | ICD-10-CM

## 2013-04-21 DIAGNOSIS — Z853 Personal history of malignant neoplasm of breast: Secondary | ICD-10-CM

## 2013-04-21 DIAGNOSIS — I10 Essential (primary) hypertension: Secondary | ICD-10-CM

## 2013-04-21 DIAGNOSIS — F329 Major depressive disorder, single episode, unspecified: Secondary | ICD-10-CM

## 2013-04-21 DIAGNOSIS — F341 Dysthymic disorder: Secondary | ICD-10-CM

## 2013-04-21 NOTE — Patient Instructions (Signed)
Please take 2 potassium supplements daily. Your lab results have been sent once again to [facility]. Return in 6 months for physical exam. Please take antihypertensive medication before coming to office at next visit.

## 2013-04-21 NOTE — Progress Notes (Signed)
  Subjective:    Patient ID: Krystal Kane, female    DOB: 1960-08-17, 52 y.o.   MRN: [id number]  HPI Patient in today for followup on hypertension. She has not had antihypertensive medication this morning. Recheck potassium in late September when she presented with a severe rash on her left arm. Potassium was low at 3.4. She was supposed to be taking 2 potassium tablets daily and is only taking 1 potassium tablet. A rash on her left arm has improved. Lab work requested by dermatologist was sent to [facility]. Patient has been under a lot of stress. Thinks her daughter is abusing prescription drugs. Thyroid checked recently and was normal.    Review of Systems     Objective:   Physical Exam spent 20 minutes speaking with patient about her family issues. Reminded her she needs to take antihypertensive medication before coming to the office. Blood pressure is elevated at 152/94. She is refusing to take flu vaccine.        Assessment & Plan:  Hypertension  Anxiety  History of depression  History of breast cancer  Hypokalemia secondary to diuretic therapy-noncompliant with potassium supplement  Plan: Patient is to return in 6 months. Was advised to take 2 potassium tablets daily and to take antihypertensive medication before coming to the office in 6 months. She is to keep an eye on her blood pressure at home.

## 2013-09-07 ENCOUNTER — Ambulatory Visit (INDEPENDENT_AMBULATORY_CARE_PROVIDER_SITE_OTHER): Payer: BC Managed Care – PPO | Admitting: Internal Medicine

## 2013-09-07 ENCOUNTER — Encounter: Payer: Self-pay | Admitting: Internal Medicine

## 2013-09-07 VITALS — BP 148/108 | HR 80 | Temp 99.1°F | Wt 188.0 lb

## 2013-09-07 DIAGNOSIS — R300 Dysuria: Secondary | ICD-10-CM

## 2013-09-07 DIAGNOSIS — J209 Acute bronchitis, unspecified: Secondary | ICD-10-CM

## 2013-09-07 DIAGNOSIS — N390 Urinary tract infection, site not specified: Secondary | ICD-10-CM

## 2013-09-07 DIAGNOSIS — I10 Essential (primary) hypertension: Secondary | ICD-10-CM

## 2013-09-07 LAB — POCT URINALYSIS DIPSTICK
Bilirubin, UA: NEGATIVE
Glucose, UA: NEGATIVE
Ketones, UA: NEGATIVE
Nitrite, UA: NEGATIVE
Spec Grav, UA: 1.01
Urobilinogen, UA: NEGATIVE
pH, UA: 7.5

## 2013-09-07 LAB — CBC WITH DIFFERENTIAL/PLATELET
Basophils Absolute: 0 10*3/uL (ref 0.0–0.1)
Basophils Relative: 0 % (ref 0–1)
Eosinophils Absolute: 0 10*3/uL (ref 0.0–0.7)
Eosinophils Relative: 0 % (ref 0–5)
HCT: 40.8 % (ref 36.0–46.0)
Hemoglobin: 13.8 g/dL (ref 12.0–15.0)
LYMPHS ABS: 2.7 10*3/uL (ref 0.7–4.0)
LYMPHS PCT: 24 % (ref 12–46)
MCH: 31.9 pg (ref 26.0–34.0)
MCHC: 33.8 g/dL (ref 30.0–36.0)
MCV: 94.2 fL (ref 78.0–100.0)
Monocytes Absolute: 1.2 10*3/uL — ABNORMAL HIGH (ref 0.1–1.0)
Monocytes Relative: 11 % (ref 3–12)
NEUTROS PCT: 65 % (ref 43–77)
Neutro Abs: 7.2 10*3/uL (ref 1.7–7.7)
Platelets: 265 10*3/uL (ref 150–400)
RBC: 4.33 MIL/uL (ref 3.87–5.11)
RDW: 13.3 % (ref 11.5–15.5)
WBC: 11.1 10*3/uL — AB (ref 4.0–10.5)

## 2013-09-07 MED ORDER — FLUTICASONE-SALMETEROL 250-50 MCG/DOSE IN AEPB: 1.0000 | INHALATION_SPRAY | Freq: Two times a day (BID) | RESPIRATORY_TRACT | Status: DC

## 2013-09-07 MED ORDER — ALBUTEROL SULFATE HFA 108 (90 BASE) MCG/ACT IN AERS: 2.0000 | INHALATION_SPRAY | Freq: Four times a day (QID) | RESPIRATORY_TRACT | Status: DC | PRN

## 2013-09-07 MED ORDER — LEVOFLOXACIN 500 MG PO TABS: 500.0000 mg | ORAL_TABLET | Freq: Every day | ORAL | Status: DC

## 2013-09-07 MED ORDER — CEFTRIAXONE SODIUM 1 G IJ SOLR
1.0000 g | Freq: Once | INTRAMUSCULAR | Status: AC
  Administered 2013-09-07: 1 g via INTRAMUSCULAR

## 2013-09-07 NOTE — Patient Instructions (Addendum)
Use inhalers as prescribed. Rocephin one gram IM given. Take Levaquin daily for 10 days. Stop sudafed

## 2013-09-07 NOTE — Progress Notes (Signed)
   Subjective:    Patient ID: Krystal Kane, female    DOB: 30-Aug-1960, 53 y.o.   MRN: [id number]  HPI  Here today with 2 issues. Has had UTI symptoms nearly one week. No fever or shaking chills. Mainly has back pain and dysuria. Back pain is on the left. Also has developed acute respiratory infection as of March 16. Has had some shortness of breath and wheezing. Pulse oximetry is 97% on room air. Has cough and congestion. Sounds hoarse when she speaks. Complains of wheezing. Blood pressure elevated today but she's been taking Sudafed for congestion.    Review of Systems     Objective:   Physical Exam Urinalysis today is abnormal. Culture taken. Left CVA tenderness. Pharynx slightly injected. TMs are clear. Neck is supple. Chest without rales. Scattered wheezing on chest exam.       Assessment & Plan:  Bronchitis  Reactive airways  Urinary tract infection-culture pending. CBC with differential drawn  Hypertension-elevated today. Stop Sudafed .  Plan: Albuterol inhaler 2 sprays by mouth 4 times a day for 10 days. Advair 250/50 inhaler one spray by mouth every 12 hours for 10 days. Rocephin 1 g IM given today. Levaquin 500 milligrams daily for 10 days. Repeat urinalysis in 2 weeks. Call if symptoms worsen.

## 2013-09-09 LAB — URINE CULTURE

## 2013-09-21 ENCOUNTER — Ambulatory Visit: Payer: BC Managed Care – PPO | Admitting: Internal Medicine

## 2013-10-24 ENCOUNTER — Other Ambulatory Visit: Payer: BC Managed Care – PPO | Admitting: Internal Medicine

## 2013-10-24 DIAGNOSIS — Z13220 Encounter for screening for lipoid disorders: Secondary | ICD-10-CM

## 2013-10-24 DIAGNOSIS — I10 Essential (primary) hypertension: Secondary | ICD-10-CM

## 2013-10-24 DIAGNOSIS — Z130 Encounter for screening for diseases of the blood and blood-forming organs and certain disorders involving the immune mechanism: Secondary | ICD-10-CM

## 2013-10-24 DIAGNOSIS — Z13228 Encounter for screening for other metabolic disorders: Secondary | ICD-10-CM

## 2013-10-24 DIAGNOSIS — Z1329 Encounter for screening for other suspected endocrine disorder: Secondary | ICD-10-CM

## 2013-10-24 LAB — CBC WITH DIFFERENTIAL/PLATELET
Basophils Absolute: 0 10*3/uL (ref 0.0–0.1)
Basophils Relative: 0 % (ref 0–1)
Eosinophils Absolute: 0.1 10*3/uL (ref 0.0–0.7)
Eosinophils Relative: 1 % (ref 0–5)
HEMATOCRIT: 39 % (ref 36.0–46.0)
HEMOGLOBIN: 13.2 g/dL (ref 12.0–15.0)
LYMPHS PCT: 23 % (ref 12–46)
Lymphs Abs: 1.9 10*3/uL (ref 0.7–4.0)
MCH: 31.9 pg (ref 26.0–34.0)
MCHC: 33.8 g/dL (ref 30.0–36.0)
MCV: 94.2 fL (ref 78.0–100.0)
MONOS PCT: 6 % (ref 3–12)
Monocytes Absolute: 0.5 10*3/uL (ref 0.1–1.0)
NEUTROS ABS: 5.7 10*3/uL (ref 1.7–7.7)
Neutrophils Relative %: 70 % (ref 43–77)
Platelets: 292 10*3/uL (ref 150–400)
RBC: 4.14 MIL/uL (ref 3.87–5.11)
RDW: 13.3 % (ref 11.5–15.5)
WBC: 8.1 10*3/uL (ref 4.0–10.5)

## 2013-10-24 LAB — COMPREHENSIVE METABOLIC PANEL
ALT: 9 U/L (ref 0–35)
AST: 18 U/L (ref 0–37)
Albumin: 4 g/dL (ref 3.5–5.2)
Alkaline Phosphatase: 76 U/L (ref 39–117)
BUN: 13 mg/dL (ref 6–23)
CALCIUM: 9 mg/dL (ref 8.4–10.5)
CHLORIDE: 105 meq/L (ref 96–112)
CO2: 30 mEq/L (ref 19–32)
Creat: 0.64 mg/dL (ref 0.50–1.10)
GLUCOSE: 89 mg/dL (ref 70–99)
POTASSIUM: 3.6 meq/L (ref 3.5–5.3)
Sodium: 141 mEq/L (ref 135–145)
Total Bilirubin: 0.4 mg/dL (ref 0.2–1.2)
Total Protein: 6.1 g/dL (ref 6.0–8.3)

## 2013-10-24 LAB — TSH: TSH: 1.503 u[IU]/mL (ref 0.350–4.500)

## 2013-10-24 LAB — LIPID PANEL
Cholesterol: 209 mg/dL — ABNORMAL HIGH (ref 0–200)
HDL: 58 mg/dL (ref >39)
LDL Cholesterol: 136 mg/dL — ABNORMAL HIGH (ref 0–99)
Total CHOL/HDL Ratio: 3.6 Ratio
Triglycerides: 75 mg/dL (ref <150)
VLDL: 15 mg/dL (ref 0–40)

## 2013-10-25 LAB — VITAMIN D 25 HYDROXY (VIT D DEFICIENCY, FRACTURES): Vit D, 25-Hydroxy: 47 ng/mL (ref 30–89)

## 2013-10-26 ENCOUNTER — Ambulatory Visit (INDEPENDENT_AMBULATORY_CARE_PROVIDER_SITE_OTHER): Payer: BC Managed Care – PPO | Admitting: Internal Medicine

## 2013-10-26 ENCOUNTER — Encounter: Payer: Self-pay | Admitting: Internal Medicine

## 2013-10-26 VITALS — BP 136/88 | HR 88 | Ht 63.0 in | Wt 189.0 lb

## 2013-10-26 DIAGNOSIS — Z853 Personal history of malignant neoplasm of breast: Secondary | ICD-10-CM

## 2013-10-26 DIAGNOSIS — Z0000 Encounter for general adult medical examination without abnormal findings: Secondary | ICD-10-CM

## 2013-10-26 DIAGNOSIS — Z87442 Personal history of urinary calculi: Secondary | ICD-10-CM

## 2013-10-26 DIAGNOSIS — F411 Generalized anxiety disorder: Secondary | ICD-10-CM

## 2013-10-26 DIAGNOSIS — I10 Essential (primary) hypertension: Secondary | ICD-10-CM

## 2013-10-26 DIAGNOSIS — E876 Hypokalemia: Secondary | ICD-10-CM

## 2013-10-26 DIAGNOSIS — E785 Hyperlipidemia, unspecified: Secondary | ICD-10-CM

## 2013-10-26 LAB — POCT URINALYSIS DIPSTICK
BILIRUBIN UA: NEGATIVE
Blood, UA: NEGATIVE
GLUCOSE UA: NEGATIVE
Ketones, UA: NEGATIVE
Leukocytes, UA: NEGATIVE
NITRITE UA: NEGATIVE
Protein, UA: NEGATIVE
Urobilinogen, UA: NEGATIVE
pH, UA: 7.5

## 2013-10-26 NOTE — Progress Notes (Signed)
Subjective:    Patient ID: Krystal Kane, female    DOB: 12-06-1960, 53 y.o.   MRN: [id number]  HPI 53 year old white female in today for health maintenance and evaluation of medical issues. History of hypertension, obesity, anxiety, history of breast cancer. Has been on antihypertensive medication since 2009. History of kidney stones. Hypokalemia on diuretic therapy.  Past medical history: Pneumonia 2008. History of hysterectomy without oophorectomy. Had appendectomy in May 2012 at [facility]. Surgery for right knee torn meniscus 03/26/2010.  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 Tumor was a poorly differentiated tumor with associated area of microinvasion. Tumor was PR/ER negative. HER-2/neu negative. Chemotherapy was not recommended. She had radiation and has done well. However having breast cancer has caused considerable anxiety.  Social history: She has 3 daughters. She is married. 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 Does not smoke or consume alcohol.   Family history: Mother died in 03-26-2013 with history of breast cancer. Father died at age 5 as a result of anesthesia when patient was 53 years old. Maternal aunt died at age 64 of metastatic breast cancer. One sister in good health. Half sister in good health.     Review of Systems  Constitutional: Positive for fatigue.  Eyes: Negative.   Respiratory: Negative.   Cardiovascular: Negative.   Gastrointestinal: Negative.   Genitourinary: Negative.   Skin:       Has a recurrence of a unusual rash left arm. Dermatologist is treating  Psychiatric/Behavioral:       History of anxiety       Objective:   Physical Exam  Constitutional: She is oriented to person, place, and time. She appears well-nourished. No distress.  HENT:  Head: Normocephalic and atraumatic.    Right Ear: External ear normal.  Left Ear: External ear normal.  Mouth/Throat: No oropharyngeal exudate.  Eyes: Conjunctivae and EOM are normal. Right eye exhibits no discharge. Left eye exhibits no discharge. No scleral icterus.  Neck: Neck supple. No JVD present. No thyromegaly present.  Cardiovascular: Normal rate, regular rhythm and normal heart sounds.   No murmur heard. Pulmonary/Chest: Effort normal and breath sounds normal. No respiratory distress. She has no wheezes. She has no rales. She exhibits no tenderness.  Breasts normal female without masses  Abdominal: Soft. Bowel sounds are normal. She exhibits no distension and no mass. There is no tenderness. There is no rebound.  Genitourinary:  Pap deferred last done 2013. Bimanual normal  Musculoskeletal: Normal range of motion. She exhibits no edema.  Lymphadenopathy:    She has no cervical adenopathy.  Neurological: She is alert and oriented to person, place, and time. She has normal reflexes. She displays normal reflexes. No cranial nerve deficit. Coordination normal.  Skin: Skin is warm and dry.  Erythematous macular papular rash left upper arm  Psychiatric: She has a normal mood and affect. Her behavior is normal. Judgment and thought content normal.          Assessment & Plan:  Hypertension-stable  Hyperlipidemia-last year total cholesterol was 235 and is now 209. LDL cholesterol was 150 and is now 136. Triglycerides were 162 and are now 75. Recommend diet exercise and recheck in 6 months. TSH is normal. Remainder of lab work is normal.  History of breast cancer-patient has not had recent mammogram. Order placed for mammogram at the breast  Center.  Hypokalemia on diuretic  History of anxiety  Health maintenance-due for colonoscopy. She will consider it  Plan: Return in 6 months for office visit basic metabolic panel, fasting lipid panel and blood pressure check. Consider colonoscopy. Have mammogram.

## 2013-10-28 ENCOUNTER — Encounter: Payer: Self-pay | Admitting: Internal Medicine

## 2013-10-28 NOTE — Patient Instructions (Signed)
Watch diet and exercise. Recheck lipid panel in 6 months along with blood pressure and basic metabolic panel. Have mammogram and consider colonoscopy.

## 2013-11-01 ENCOUNTER — Other Ambulatory Visit: Payer: Self-pay | Admitting: Internal Medicine

## 2013-11-01 DIAGNOSIS — Z1231 Encounter for screening mammogram for malignant neoplasm of breast: Secondary | ICD-10-CM

## 2013-11-06 ENCOUNTER — Other Ambulatory Visit: Payer: Self-pay

## 2013-11-06 ENCOUNTER — Telehealth: Payer: Self-pay | Admitting: Internal Medicine

## 2013-11-06 MED ORDER — AMLODIPINE BESYLATE 5 MG PO TABS: 5.0000 mg | ORAL_TABLET | Freq: Every day | ORAL | Status: DC

## 2013-11-06 NOTE — Telephone Encounter (Signed)
In reviewing patient medications, she states she is not taking Amlodipine, only Ramipril 10mg  and Lasix 20mg . Advised she needs to restart this. Sent to [facility] in [place]. Will check BP at home and call with some readings.

## 2013-11-06 NOTE — Telephone Encounter (Signed)
Nothing different in meds. Stress level same. Says her BP cuff gets good readings on other people.

## 2013-11-06 NOTE — Telephone Encounter (Signed)
BP on May 7 th was 136/88. Please inquire what is different now? Stress? Has she stopped some of her meds?

## 2013-11-09 ENCOUNTER — Other Ambulatory Visit: Payer: Self-pay

## 2013-11-09 MED ORDER — POTASSIUM CHLORIDE CRYS ER 20 MEQ PO TBCR: 20.0000 meq | EXTENDED_RELEASE_TABLET | Freq: Every day | ORAL | Status: DC

## 2013-11-21 ENCOUNTER — Encounter (INDEPENDENT_AMBULATORY_CARE_PROVIDER_SITE_OTHER): Payer: Self-pay

## 2013-11-21 ENCOUNTER — Ambulatory Visit
Admission: RE | Admit: 2013-11-21 | Discharge: 2013-11-21 | Disposition: A | Discharge status: Home / Self Care | Payer: BC Managed Care – PPO | Source: Ambulatory Visit | Attending: Internal Medicine | Admitting: Internal Medicine

## 2013-11-21 DIAGNOSIS — Z1231 Encounter for screening mammogram for malignant neoplasm of breast: Secondary | ICD-10-CM

## 2014-01-25 ENCOUNTER — Other Ambulatory Visit: Payer: Self-pay | Admitting: Internal Medicine

## 2014-02-02 ENCOUNTER — Other Ambulatory Visit: Payer: Self-pay

## 2014-02-02 ENCOUNTER — Other Ambulatory Visit: Payer: Self-pay | Admitting: Internal Medicine

## 2014-02-02 MED ORDER — AMLODIPINE BESYLATE 5 MG PO TABS: 5.0000 mg | ORAL_TABLET | Freq: Every day | ORAL | Status: DC

## 2014-04-10 ENCOUNTER — Other Ambulatory Visit: Payer: Self-pay | Admitting: Internal Medicine

## 2014-04-23 ENCOUNTER — Other Ambulatory Visit (INDEPENDENT_AMBULATORY_CARE_PROVIDER_SITE_OTHER): Payer: BC Managed Care – PPO | Admitting: Internal Medicine

## 2014-04-23 ENCOUNTER — Encounter: Payer: Self-pay | Admitting: Internal Medicine

## 2014-04-23 ENCOUNTER — Ambulatory Visit (INDEPENDENT_AMBULATORY_CARE_PROVIDER_SITE_OTHER): Payer: BC Managed Care – PPO | Admitting: Internal Medicine

## 2014-04-23 VITALS — BP 140/88 | HR 92 | Temp 98.5°F | Ht 63.0 in | Wt 189.0 lb

## 2014-04-23 DIAGNOSIS — I10 Essential (primary) hypertension: Secondary | ICD-10-CM

## 2014-04-23 DIAGNOSIS — R319 Hematuria, unspecified: Secondary | ICD-10-CM

## 2014-04-23 DIAGNOSIS — N390 Urinary tract infection, site not specified: Secondary | ICD-10-CM

## 2014-04-23 LAB — BASIC METABOLIC PANEL
BUN: 7 mg/dL (ref 6–23)
CHLORIDE: 107 meq/L (ref 96–112)
CO2: 27 mEq/L (ref 19–32)
Calcium: 8.9 mg/dL (ref 8.4–10.5)
Creat: 0.64 mg/dL (ref 0.50–1.10)
GLUCOSE: 91 mg/dL (ref 70–99)
POTASSIUM: 3.8 meq/L (ref 3.5–5.3)
Sodium: 140 mEq/L (ref 135–145)

## 2014-04-23 LAB — POCT URINALYSIS DIPSTICK
Bilirubin, UA: NEGATIVE
Glucose, UA: NEGATIVE
KETONES UA: NEGATIVE
Nitrite, UA: NEGATIVE
PH UA: 8
Protein, UA: POSITIVE
SPEC GRAV UA: 1.01
Urobilinogen, UA: NEGATIVE

## 2014-04-23 LAB — LIPID PANEL
CHOLESTEROL: 208 mg/dL — AB (ref 0–200)
HDL: 46 mg/dL (ref >39)
LDL Cholesterol: 133 mg/dL — ABNORMAL HIGH (ref 0–99)
Total CHOL/HDL Ratio: 4.5 Ratio
Triglycerides: 143 mg/dL (ref <150)
VLDL: 29 mg/dL (ref 0–40)

## 2014-04-23 MED ORDER — CIPROFLOXACIN HCL 500 MG PO TABS: 500.0000 mg | ORAL_TABLET | Freq: Two times a day (BID) | ORAL | Status: DC

## 2014-04-23 NOTE — Progress Notes (Signed)
   Subjective:    Patient ID: Krystal Kane, female    DOB: 13-Apr-1961, 53 y.o.   MRN: [id number]  HPI  In today for acute visit. Has not BP meds this am. Has UTI symptoms  Started last week. Took Azo-Standard but stopped it yesterday. Urinalysis is abnormal. Culture taken. Has had back pain and some slight chills but no documented fever. Feels all full. Is tearful in the office today. Blood pressure is elevated because she's not had her blood pressure medication. Fasting lipid panel drawn for six-month follow-up with history of hyperlipidemia.    Review of Systems     Objective:   Physical Exam  No CVA tenderness. Chest clear. Cardiac exam 2/6 systolic ejection murmur. Skin is warm and dry. Extremity is without edema      Assessment & Plan:  Acute UTI-abnormal urine dipstick. Culture pending. Follow-up urinalysis in 2 weeks.  Hypertension-elevated today because she has not had blood pressure medication. She is to check her blood pressure at home and call me with results  Hyperlipidemia-elevated LDL cholesterol. Recommend diet and exercise  Anxiety  History of breast cancer  Plan: Cipro 500 mg twice daily for 10 days. Recheck urine in 2 weeks. Physical exam due in 6 months. Continue to monitor blood pressure at home when she's had her medications and call if elevated.  25 minutes spent with patient

## 2014-04-24 ENCOUNTER — Other Ambulatory Visit: Payer: BC Managed Care – PPO | Admitting: Internal Medicine

## 2014-04-24 LAB — URINALYSIS, MICROSCOPIC ONLY
CASTS: NONE SEEN
Crystals: NONE SEEN
RBC / HPF: >50 RBC/hpf — AB (ref <3)
SQUAMOUS EPITHELIAL / LPF: NONE SEEN

## 2014-04-24 LAB — URINE CULTURE

## 2014-04-26 ENCOUNTER — Ambulatory Visit: Payer: BC Managed Care – PPO | Admitting: Internal Medicine

## 2014-05-10 ENCOUNTER — Other Ambulatory Visit: Payer: BC Managed Care – PPO | Admitting: Internal Medicine

## 2014-05-11 ENCOUNTER — Other Ambulatory Visit (INDEPENDENT_AMBULATORY_CARE_PROVIDER_SITE_OTHER): Payer: BC Managed Care – PPO | Admitting: Internal Medicine

## 2014-05-11 DIAGNOSIS — Z1212 Encounter for screening for malignant neoplasm of rectum: Secondary | ICD-10-CM

## 2014-05-11 DIAGNOSIS — R319 Hematuria, unspecified: Secondary | ICD-10-CM

## 2014-05-11 DIAGNOSIS — N390 Urinary tract infection, site not specified: Secondary | ICD-10-CM

## 2014-05-11 LAB — POCT URINALYSIS DIPSTICK
BILIRUBIN UA: NEGATIVE
Blood, UA: NEGATIVE
Glucose, UA: NEGATIVE
KETONES UA: NEGATIVE
Leukocytes, UA: NEGATIVE
Nitrite, UA: NEGATIVE
PROTEIN UA: NEGATIVE
Urobilinogen, UA: NEGATIVE
pH, UA: 7

## 2014-05-15 ENCOUNTER — Encounter: Payer: Self-pay | Admitting: Gastroenterology

## 2014-06-11 ENCOUNTER — Ambulatory Visit (INDEPENDENT_AMBULATORY_CARE_PROVIDER_SITE_OTHER): Payer: BC Managed Care – PPO | Admitting: Internal Medicine

## 2014-06-11 ENCOUNTER — Encounter: Payer: Self-pay | Admitting: Internal Medicine

## 2014-06-11 VITALS — BP 134/86 | HR 90 | Temp 98.2°F | Wt 191.0 lb

## 2014-06-11 DIAGNOSIS — M545 Low back pain: Secondary | ICD-10-CM

## 2014-06-11 DIAGNOSIS — I10 Essential (primary) hypertension: Secondary | ICD-10-CM

## 2014-06-11 DIAGNOSIS — L982 Febrile neutrophilic dermatosis [Sweet]: Secondary | ICD-10-CM | POA: Diagnosis not present

## 2014-06-11 MED ORDER — HYDROCODONE-ACETAMINOPHEN 5-325 MG PO TABS: 1.0000 | ORAL_TABLET | Freq: Four times a day (QID) | ORAL | Status: DC | PRN

## 2014-06-11 NOTE — Patient Instructions (Signed)
Take Norco 5/325 as needed for back pain. Have colonoscopy in February.

## 2014-07-28 NOTE — Patient Instructions (Signed)
Take Cipro as directed. Return in 2 weeks for follow-up on abnormal urine dipstick. Take blood pressure at home and call me if consistently elevated. Watch diet and exercise. Physical exam due in 6 months.

## 2014-07-30 ENCOUNTER — Ambulatory Visit (AMBULATORY_SURGERY_CENTER): Payer: Self-pay | Admitting: *Deleted

## 2014-07-30 VITALS — Ht 62.5 in | Wt 192.8 lb

## 2014-07-30 DIAGNOSIS — Z1211 Encounter for screening for malignant neoplasm of colon: Secondary | ICD-10-CM

## 2014-07-30 MED ORDER — MOVIPREP 100 G PO SOLR: 1.0000 | Freq: Once | ORAL | Status: DC

## 2014-07-30 NOTE — Progress Notes (Signed)
No egg or soy allergy No diet pills No home 02 use No issues with past sedation Pt declined emmi video

## 2014-08-13 ENCOUNTER — Encounter: Payer: BC Managed Care – PPO | Admitting: Gastroenterology

## 2014-08-13 ENCOUNTER — Encounter: Payer: Self-pay | Admitting: Gastroenterology

## 2014-08-24 ENCOUNTER — Ambulatory Visit (AMBULATORY_SURGERY_CENTER): Payer: BLUE CROSS/BLUE SHIELD | Admitting: Gastroenterology

## 2014-08-24 ENCOUNTER — Encounter: Payer: Self-pay | Admitting: Gastroenterology

## 2014-08-24 VITALS — BP 153/87 | HR 62 | Temp 97.4°F | Resp 27 | Ht 62.5 in | Wt 192.0 lb

## 2014-08-24 DIAGNOSIS — Z1211 Encounter for screening for malignant neoplasm of colon: Secondary | ICD-10-CM

## 2014-08-24 MED ORDER — SODIUM CHLORIDE 0.9 % IV SOLN: 500.0000 mL | INTRAVENOUS | Status: DC

## 2014-08-24 NOTE — Op Note (Signed)
[facility]  [address] [place], [id number]   COLONOSCOPY PROCEDURE REPORT  PATIENT: Krystal Kane, Krystal Kane  MR#: [id number] BIRTHDATE: 07/18/1960 , 53  yrs. old GENDER: female ENDOSCOPIST: Milus Banister, MD REFERRED EN:MM[facility] Parke Simmers, M.D. PROCEDURE DATE:  08/24/2014 PROCEDURE:   Colonoscopy, diagnostic First Screening Colonoscopy - Avg.  risk and is 50 yrs.  old or older Yes.  Prior Negative Screening - Now for repeat screening. N/A  History of Adenoma - Now for follow-up colonoscopy & has been > or = to 3 yrs.  N/A  Polyps Removed Today? No.  Recommend repeat exam, <10 yrs? No. ASA CLASS:   Class II INDICATIONS:average risk patient for colon cancer. MEDICATIONS: Monitored anesthesia care, Propofol 250 mg IV, and lidocaine 40mg  IV  DESCRIPTION OF PROCEDURE:   After the risks benefits and alternatives of the procedure were thoroughly explained, informed consent was obtained.  The digital rectal exam revealed no abnormalities of the rectum.   The LB [id number]  endoscope was introduced through the anus and advanced to the cecum, which was identified by both the appendix and ileocecal valve. No adverse events experienced.   The quality of the prep was excellent.  The instrument was then slowly withdrawn as the colon was fully examined.   COLON FINDINGS: A normal appearing cecum, ileocecal valve, and appendiceal orifice were identified.  The ascending, transverse, descending, sigmoid colon, and rectum appeared unremarkable. Retroflexed views revealed no abnormalities. The time to cecum=3 minutes 05 seconds.  Withdrawal time=6 minutes 54 seconds.  The scope was withdrawn and the procedure completed. COMPLICATIONS: There were no immediate complications.  ENDOSCOPIC IMPRESSION: Normal colonoscopy No polyps or cancers  RECOMMENDATIONS: You should continue to follow colorectal cancer screening guidelines for "routine risk" patients with a repeat colonoscopy  in 10 years.  eSigned:  Milus Banister, MD 08/24/2014 11:11 AM

## 2014-08-24 NOTE — Patient Instructions (Signed)
YOU HAD AN ENDOSCOPIC PROCEDURE TODAY AT THE [facility]:   Refer to the procedure report that was given to you for any specific questions about what was found during the examination.  If the procedure report does not answer your questions, please call your gastroenterologist to clarify.  If you requested that your care partner not be given the details of your procedure findings, then the procedure report has been included in a sealed envelope for you to review at your convenience later.  YOU SHOULD EXPECT: Some feelings of bloating in the abdomen. Passage of more gas than usual.  Walking can help get rid of the air that was put into your GI tract during the procedure and reduce the bloating. If you had a lower endoscopy (such as a colonoscopy or flexible sigmoidoscopy) you may notice spotting of blood in your stool or on the toilet paper. If you underwent a bowel prep for your procedure, you may not have a normal bowel movement for a few days.  Please Note:  You might notice some irritation and congestion in your nose or some drainage.  This is from the oxygen used during your procedure.  There is no need for concern and it should clear up in a day or so.  SYMPTOMS TO REPORT IMMEDIATELY:   Following lower endoscopy (colonoscopy or flexible sigmoidoscopy):  Excessive amounts of blood in the stool  Significant tenderness or worsening of abdominal pains  Swelling of the abdomen that is new, acute  Fever of 100F or higher   For urgent or emergent issues, a gastroenterologist can be reached at any hour by calling [phone].   DIET: Your first meal following the procedure should be a small meal and then it is ok to progress to your normal diet. Heavy or fried foods are harder to digest and may make you feel nauseous or bloated.  Likewise, meals heavy in dairy and vegetables can increase bloating.  Drink plenty of fluids but you should avoid alcoholic beverages for 24  hours.  ACTIVITY:  You should plan to take it easy for the rest of today and you should NOT DRIVE or use heavy machinery until tomorrow (because of the sedation medicines used during the test).    FOLLOW UP: Our staff will call the number listed on your records the next business day following your procedure to check on you and address any questions or concerns that you may have regarding the information given to you following your procedure. If we do not reach you, we will leave a message.  However, if you are feeling well and you are not experiencing any problems, there is no need to return our call.  We will assume that you have returned to your regular daily activities without incident.  If any biopsies were taken you will be contacted by phone or by letter within the next 1-3 weeks.  Please call us at [phone] if you have not heard about the biopsies in 3 weeks.    SIGNATURES/CONFIDENTIALITY: You and/or your care partner have signed paperwork which will be entered into your electronic medical record.  These signatures attest to the fact that that the information above on your After Visit Summary has been reviewed and is understood.  Full responsibility of the confidentiality of this discharge information lies with you and/or your care-partner. 

## 2014-08-24 NOTE — Progress Notes (Signed)
Stable to RR 

## 2014-08-27 ENCOUNTER — Telehealth: Payer: Self-pay | Admitting: *Deleted

## 2014-08-27 NOTE — Telephone Encounter (Signed)
  Follow up Call-  Call back number 08/24/2014  Post procedure Call Back phone  # [phone]  Permission to leave phone message Yes     Patient questions:  Message left to call us if necessary.

## 2014-09-15 ENCOUNTER — Encounter: Payer: Self-pay | Admitting: Internal Medicine

## 2014-09-15 DIAGNOSIS — L982 Febrile neutrophilic dermatosis [Sweet]: Secondary | ICD-10-CM | POA: Insufficient documentation

## 2014-09-15 NOTE — Progress Notes (Signed)
   Subjective:    Patient ID: Krystal Kane, female    DOB: 11/08/1960, 54 y.o.   MRN: [id number]  HPI  Patient is here today with back pain. She is also here to discuss recent diagnosis of Sweet syndrome. She had a number of lab studies done recently by Dr. Sharol Roussel. Serum protein electrophoresis showed no M spike. DNase-B antibody was normal. G6PD level within normal limits. Right arm skin biopsy showed papillary dermal edema overlying a dermal perivascular and interstitial inflammatory infiltrate including lymphocytes, histiocytes and numerous neutrophils with nuclear dust. 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 Had been troubled with significant dermatitis for some time.  She had a serious UTI in November. History of essential hypertension, anxiety, breast cancer.    Review of Systems     Objective:   Physical Exam  Straight leg raising is negative at 90 bilaterally. Deep tendon reflexes 2+ and symmetrical. Muscle strength is normal in the lower extremities.      Assessment & Plan:  Low back pain  Essential hypertension  Sweet's syndrome   History of breast cancer  Anxiety  Plan: Take Norco 5/325 every 8 hours when necessary back pain. Physical exam due May 2016.

## 2014-11-02 ENCOUNTER — Other Ambulatory Visit: Payer: BLUE CROSS/BLUE SHIELD | Admitting: Internal Medicine

## 2014-11-02 ENCOUNTER — Other Ambulatory Visit: Payer: Self-pay | Admitting: Internal Medicine

## 2014-11-02 DIAGNOSIS — Z1321 Encounter for screening for nutritional disorder: Secondary | ICD-10-CM

## 2014-11-02 DIAGNOSIS — Z0000 Encounter for general adult medical examination without abnormal findings: Secondary | ICD-10-CM

## 2014-11-02 DIAGNOSIS — Z1329 Encounter for screening for other suspected endocrine disorder: Secondary | ICD-10-CM

## 2014-11-02 DIAGNOSIS — Z13220 Encounter for screening for lipoid disorders: Secondary | ICD-10-CM

## 2014-11-02 DIAGNOSIS — Z130 Encounter for screening for diseases of the blood and blood-forming organs and certain disorders involving the immune mechanism: Secondary | ICD-10-CM

## 2014-11-02 LAB — COMPLETE METABOLIC PANEL WITH GFR
ALBUMIN: 4.2 g/dL (ref 3.5–5.2)
ALK PHOS: 96 U/L (ref 39–117)
ALT: 11 U/L (ref 0–35)
AST: 17 U/L (ref 0–37)
BUN: 10 mg/dL (ref 6–23)
CO2: 28 meq/L (ref 19–32)
CREATININE: 0.6 mg/dL (ref 0.50–1.10)
Calcium: 9.1 mg/dL (ref 8.4–10.5)
Chloride: 105 mEq/L (ref 96–112)
GFR, Est African American: >89 mL/min
GFR, Est Non African American: >89 mL/min
GLUCOSE: 94 mg/dL (ref 70–99)
POTASSIUM: 3.8 meq/L (ref 3.5–5.3)
Sodium: 143 mEq/L (ref 135–145)
Total Bilirubin: 0.5 mg/dL (ref 0.2–1.2)
Total Protein: 6.3 g/dL (ref 6.0–8.3)

## 2014-11-02 LAB — CBC WITH DIFFERENTIAL/PLATELET
BASOS ABS: 0 10*3/uL (ref 0.0–0.1)
Basophils Relative: 0 % (ref 0–1)
EOS ABS: 0.1 10*3/uL (ref 0.0–0.7)
Eosinophils Relative: 1 % (ref 0–5)
HCT: 34.9 % — ABNORMAL LOW (ref 36.0–46.0)
Hemoglobin: 11.6 g/dL — ABNORMAL LOW (ref 12.0–15.0)
LYMPHS ABS: 1.9 10*3/uL (ref 0.7–4.0)
LYMPHS PCT: 25 % (ref 12–46)
MCH: 32.5 pg (ref 26.0–34.0)
MCHC: 33.2 g/dL (ref 30.0–36.0)
MCV: 97.8 fL (ref 78.0–100.0)
MPV: 9.6 fL (ref 8.6–12.4)
Monocytes Absolute: 0.5 10*3/uL (ref 0.1–1.0)
Monocytes Relative: 7 % (ref 3–12)
Neutro Abs: 5 10*3/uL (ref 1.7–7.7)
Neutrophils Relative %: 67 % (ref 43–77)
Platelets: 307 10*3/uL (ref 150–400)
RBC: 3.57 MIL/uL — ABNORMAL LOW (ref 3.87–5.11)
RDW: 13.5 % (ref 11.5–15.5)
WBC: 7.5 10*3/uL (ref 4.0–10.5)

## 2014-11-02 LAB — LIPID PANEL
Cholesterol: 209 mg/dL — ABNORMAL HIGH (ref 0–200)
HDL: 49 mg/dL (ref >=46)
LDL Cholesterol: 128 mg/dL — ABNORMAL HIGH (ref 0–99)
Total CHOL/HDL Ratio: 4.3 Ratio
Triglycerides: 161 mg/dL — ABNORMAL HIGH (ref <150)
VLDL: 32 mg/dL (ref 0–40)

## 2014-11-02 LAB — TSH: TSH: 2.027 u[IU]/mL (ref 0.350–4.500)

## 2014-11-03 LAB — VITAMIN D 25 HYDROXY (VIT D DEFICIENCY, FRACTURES): VIT D 25 HYDROXY: 28 ng/mL — AB (ref 30–100)

## 2014-11-05 ENCOUNTER — Encounter: Payer: Self-pay | Admitting: Internal Medicine

## 2014-11-05 ENCOUNTER — Ambulatory Visit (INDEPENDENT_AMBULATORY_CARE_PROVIDER_SITE_OTHER): Payer: BLUE CROSS/BLUE SHIELD | Admitting: Internal Medicine

## 2014-11-05 VITALS — BP 126/84 | HR 96 | Temp 97.8°F | Ht 63.0 in | Wt 194.0 lb

## 2014-11-05 DIAGNOSIS — E7800 Pure hypercholesterolemia, unspecified: Secondary | ICD-10-CM

## 2014-11-05 DIAGNOSIS — L982 Febrile neutrophilic dermatosis [Sweet]: Secondary | ICD-10-CM | POA: Diagnosis not present

## 2014-11-05 DIAGNOSIS — Z0000 Encounter for general adult medical examination without abnormal findings: Secondary | ICD-10-CM | POA: Diagnosis not present

## 2014-11-05 DIAGNOSIS — Z853 Personal history of malignant neoplasm of breast: Secondary | ICD-10-CM | POA: Diagnosis not present

## 2014-11-05 DIAGNOSIS — E559 Vitamin D deficiency, unspecified: Secondary | ICD-10-CM

## 2014-11-05 DIAGNOSIS — I10 Essential (primary) hypertension: Secondary | ICD-10-CM

## 2014-11-05 DIAGNOSIS — F411 Generalized anxiety disorder: Secondary | ICD-10-CM | POA: Diagnosis not present

## 2014-11-05 DIAGNOSIS — Z87442 Personal history of urinary calculi: Secondary | ICD-10-CM | POA: Diagnosis not present

## 2014-11-05 DIAGNOSIS — E876 Hypokalemia: Secondary | ICD-10-CM | POA: Diagnosis not present

## 2014-11-05 LAB — POCT URINALYSIS DIPSTICK
BILIRUBIN UA: NEGATIVE
GLUCOSE UA: NEGATIVE
Ketones, UA: NEGATIVE
Leukocytes, UA: NEGATIVE
Nitrite, UA: NEGATIVE
PH UA: 7.5
Protein, UA: NEGATIVE
RBC UA: NEGATIVE
SPEC GRAV UA: 1.01
Urobilinogen, UA: NEGATIVE

## 2014-11-05 LAB — HEMOGLOBIN A1C
Hgb A1c MFr Bld: 4.7 % (ref <5.7)
Mean Plasma Glucose: 88 mg/dL (ref <117)

## 2014-11-05 NOTE — Progress Notes (Signed)
   Subjective:    Patient ID: Krystal Kane, female    DOB: 05-12-1961, 54 y.o.   MRN: [id number]  HPI  54 year old  White Female for health maintenance exam and evaluation of medical issues. History of Sweet syndrome treated with dapsone. Hgb slight low at 11.6 grams. Maybe med related. Check for iron deficiency.    history of hypokalemia related to diuretic therapy. History of essential hypertension. History of kidney stones and anxiety. Has been on antihypertensives medication since 2009.  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 Tumor was a poorly differentiated tumor with associated area of microinvasion. Tumor was PR/ER negative. Her 2/NEU negative. Chemotherapy was not recommended. She had radiation and has done well. However having breast cancer has caused considerable anxiety.  Had pneumonia 2008. History of hysterectomy without oophorectomy. Appendectomy May 2012 at [facility]. Surgery for right knee torn meniscus April 12, 2010.   SHx: married with 3 daughters one grandchild and another one on the way. 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 Husband owns Architect business  And she helps with that as well. She is a Writer of [employer]. Does not smoke or consume alcohol.   Family history: Mother died in 2013/04/12 with history of breast cancer. Father died at 38 is result of anesthesia when patient was 30 years old. Maternal aunt died at age 27 of metastatic breast cancer. One sister in good health. Half sister in good health.    Review of Systems  Rash consistent with Sweet syndrome improved. Some fatigue.     Objective:   Physical Exam  Constitutional: She is oriented to person, place, and time. She appears well-developed and well-nourished. No distress.  HENT:  Head: Normocephalic and atraumatic.  Right Ear: External ear normal.  Left Ear: External ear normal.  Mouth/Throat: Oropharynx is clear and moist. No  oropharyngeal exudate.  Eyes: Conjunctivae and EOM are normal. Right eye exhibits no discharge. Left eye exhibits no discharge. No scleral icterus.  Neck: Neck supple. No JVD present. No thyromegaly present.  Cardiovascular: Normal rate, regular rhythm, normal heart sounds and intact distal pulses.   No murmur heard. Pulmonary/Chest: Effort normal and breath sounds normal. No respiratory distress. She has no wheezes. She has no rales. She exhibits no tenderness.  Breast without masses  Abdominal: Bowel sounds are normal. She exhibits no distension. There is no tenderness. There is no rebound and no guarding.  Genitourinary:  Bimanual normal  Musculoskeletal: Normal range of motion. She exhibits no edema.  Neurological: She is alert and oriented to person, place, and time. She has normal reflexes. No cranial nerve deficit. Coordination normal.  Skin: Skin is warm and dry. She is not diaphoretic.  Psychiatric: She has a normal mood and affect. Her behavior is normal. Judgment and thought content normal.  Vitals reviewed.         Assessment & Plan:   Essential HTN  Hypokalemia secondary to diuretic therapy currently on potassium supplement  History of breast cancer  Anxiety  Sweet syndrome  Vitamin D deficiency  Elevated LDL  History of kidney stones  Plan: Continue same medications and return in 6 months. She has  hemoglobin 11.6 g but anemia workup showed no B-12 folate or iron deficiency

## 2014-11-06 ENCOUNTER — Telehealth: Payer: Self-pay | Admitting: *Deleted

## 2014-11-06 LAB — IRON AND TIBC
%SAT: 31 % (ref 20–55)
IRON: 98 ug/dL (ref 42–145)
TIBC: 313 ug/dL (ref 250–470)
UIBC: 215 ug/dL (ref 125–400)

## 2014-11-06 LAB — VITAMIN B12: Vitamin B-12: 463 pg/mL (ref 211–911)

## 2014-11-06 LAB — FOLATE

## 2014-11-06 NOTE — Telephone Encounter (Signed)
Reviewed lab results and instructions  with patient

## 2015-01-01 ENCOUNTER — Other Ambulatory Visit: Payer: Self-pay | Admitting: Internal Medicine

## 2015-01-18 ENCOUNTER — Other Ambulatory Visit: Payer: Self-pay | Admitting: Internal Medicine

## 2015-02-20 NOTE — Patient Instructions (Signed)
Continue same medications and return in 6 months. Have annual mammogram. It was pleasure to see you today.

## 2015-02-21 ENCOUNTER — Other Ambulatory Visit: Payer: Self-pay | Admitting: Internal Medicine

## 2015-03-29 ENCOUNTER — Other Ambulatory Visit: Payer: Self-pay

## 2015-03-29 DIAGNOSIS — Z1231 Encounter for screening mammogram for malignant neoplasm of breast: Secondary | ICD-10-CM

## 2015-04-25 ENCOUNTER — Other Ambulatory Visit: Payer: Self-pay | Admitting: Internal Medicine

## 2015-05-03 ENCOUNTER — Other Ambulatory Visit: Payer: BLUE CROSS/BLUE SHIELD | Admitting: Internal Medicine

## 2015-05-06 ENCOUNTER — Ambulatory Visit: Payer: BLUE CROSS/BLUE SHIELD | Admitting: Internal Medicine

## 2015-05-06 ENCOUNTER — Ambulatory Visit: Payer: BLUE CROSS/BLUE SHIELD

## 2015-05-14 ENCOUNTER — Other Ambulatory Visit: Payer: Self-pay | Admitting: Internal Medicine

## 2015-05-24 ENCOUNTER — Ambulatory Visit
Admission: RE | Admit: 2015-05-24 | Discharge: 2015-05-24 | Disposition: A | Discharge status: Home / Self Care | Payer: BLUE CROSS/BLUE SHIELD | Source: Ambulatory Visit

## 2015-05-24 ENCOUNTER — Other Ambulatory Visit: Payer: Self-pay | Admitting: Internal Medicine

## 2015-05-24 DIAGNOSIS — Z1231 Encounter for screening mammogram for malignant neoplasm of breast: Secondary | ICD-10-CM

## 2015-07-09 ENCOUNTER — Other Ambulatory Visit: Payer: Self-pay | Admitting: Internal Medicine

## 2015-08-14 ENCOUNTER — Other Ambulatory Visit: Payer: Self-pay | Admitting: Internal Medicine

## 2015-09-05 ENCOUNTER — Encounter: Payer: Self-pay | Admitting: Internal Medicine

## 2015-09-05 ENCOUNTER — Ambulatory Visit (INDEPENDENT_AMBULATORY_CARE_PROVIDER_SITE_OTHER): Payer: BLUE CROSS/BLUE SHIELD | Admitting: Internal Medicine

## 2015-09-05 VITALS — BP 154/98 | HR 101 | Temp 97.8°F | Resp 20 | Ht 63.0 in | Wt 196.0 lb

## 2015-09-05 DIAGNOSIS — I10 Essential (primary) hypertension: Secondary | ICD-10-CM | POA: Diagnosis not present

## 2015-09-05 DIAGNOSIS — R030 Elevated blood-pressure reading, without diagnosis of hypertension: Secondary | ICD-10-CM | POA: Diagnosis not present

## 2015-09-05 DIAGNOSIS — M549 Dorsalgia, unspecified: Secondary | ICD-10-CM

## 2015-09-05 DIAGNOSIS — IMO0001 Reserved for inherently not codable concepts without codable children: Secondary | ICD-10-CM

## 2015-09-05 LAB — POCT URINALYSIS DIPSTICK
BILIRUBIN UA: NEGATIVE
Glucose, UA: NEGATIVE
KETONES UA: NEGATIVE
LEUKOCYTES UA: NEGATIVE
Nitrite, UA: NEGATIVE
PH UA: 7
PROTEIN UA: NEGATIVE
RBC UA: NEGATIVE
Spec Grav, UA: 1.015
Urobilinogen, UA: 0.2

## 2015-09-05 NOTE — Progress Notes (Signed)
   Subjective:    Patient ID: Krystal Kane, female    DOB: 09/25/1960, 55 y.o.   MRN: [id number]  HPI  History of kidney stone when pregnant about 20 years ago. Had left sided pain and passed stone about 6 weeks later. Now for about 2 months, has had left sided back pain intermittent And sometimes awaken her form sleep. She has a prior history of breast cancer and is concerned about this pain.    Review of Systems as above     Objective:   Physical Exam She has no CVA tenderness today. Urinalysis by dipstick is normal without occult blood being present       Assessment & Plan:  Musculoskeletal back pain  Plan: Offered patient pain medications but she declined. She will continue to take nonsteroidal anti-inflammatory medication if needed. If symptoms persist, she will contact me and we will pursue further evaluation.

## 2015-09-13 ENCOUNTER — Other Ambulatory Visit: Payer: Self-pay | Admitting: Internal Medicine

## 2015-09-19 ENCOUNTER — Encounter: Payer: Self-pay | Admitting: Internal Medicine

## 2015-09-19 NOTE — Patient Instructions (Signed)
Please watch blood pressure at home. If symptoms persist, please contact me. May take anti-inflammatory medication when necessary pain.

## 2015-11-23 ENCOUNTER — Other Ambulatory Visit: Payer: Self-pay | Admitting: Internal Medicine

## 2015-12-09 DIAGNOSIS — M25561 Pain in right knee: Secondary | ICD-10-CM | POA: Diagnosis not present

## 2015-12-09 DIAGNOSIS — M1711 Unilateral primary osteoarthritis, right knee: Secondary | ICD-10-CM | POA: Diagnosis not present

## 2016-01-10 DIAGNOSIS — Z79899 Other long term (current) drug therapy: Secondary | ICD-10-CM | POA: Diagnosis not present

## 2016-01-10 DIAGNOSIS — R0602 Shortness of breath: Secondary | ICD-10-CM | POA: Diagnosis not present

## 2016-01-10 DIAGNOSIS — R079 Chest pain, unspecified: Secondary | ICD-10-CM | POA: Diagnosis not present

## 2016-01-10 DIAGNOSIS — Z853 Personal history of malignant neoplasm of breast: Secondary | ICD-10-CM | POA: Diagnosis not present

## 2016-01-10 DIAGNOSIS — R072 Precordial pain: Secondary | ICD-10-CM | POA: Diagnosis not present

## 2016-01-10 DIAGNOSIS — M1990 Unspecified osteoarthritis, unspecified site: Secondary | ICD-10-CM | POA: Diagnosis not present

## 2016-03-02 DIAGNOSIS — R350 Frequency of micturition: Secondary | ICD-10-CM | POA: Diagnosis not present

## 2016-03-02 DIAGNOSIS — N3001 Acute cystitis with hematuria: Secondary | ICD-10-CM | POA: Diagnosis not present

## 2016-03-17 ENCOUNTER — Other Ambulatory Visit: Payer: Self-pay | Admitting: Internal Medicine

## 2016-03-17 ENCOUNTER — Encounter: Payer: Self-pay | Admitting: Internal Medicine

## 2016-04-06 ENCOUNTER — Other Ambulatory Visit: Payer: BLUE CROSS/BLUE SHIELD | Admitting: Internal Medicine

## 2016-04-09 ENCOUNTER — Other Ambulatory Visit: Payer: BLUE CROSS/BLUE SHIELD | Admitting: Internal Medicine

## 2016-04-09 DIAGNOSIS — E669 Obesity, unspecified: Secondary | ICD-10-CM | POA: Diagnosis not present

## 2016-04-09 DIAGNOSIS — I10 Essential (primary) hypertension: Secondary | ICD-10-CM | POA: Diagnosis not present

## 2016-04-09 LAB — COMPREHENSIVE METABOLIC PANEL
ALK PHOS: 85 U/L (ref 33–130)
ALT: 13 U/L (ref 6–29)
AST: 20 U/L (ref 10–35)
Albumin: 4.4 g/dL (ref 3.6–5.1)
BUN: 10 mg/dL (ref 7–25)
CALCIUM: 9.4 mg/dL (ref 8.6–10.4)
CO2: 27 mmol/L (ref 20–31)
Chloride: 102 mmol/L (ref 98–110)
Creat: 0.64 mg/dL (ref 0.50–1.05)
Glucose, Bld: 92 mg/dL (ref 65–99)
POTASSIUM: 3.4 mmol/L — AB (ref 3.5–5.3)
Sodium: 142 mmol/L (ref 135–146)
TOTAL PROTEIN: 6.5 g/dL (ref 6.1–8.1)
Total Bilirubin: 0.5 mg/dL (ref 0.2–1.2)

## 2016-04-09 LAB — LIPID PANEL
CHOL/HDL RATIO: 4.8 ratio (ref <=5.0)
CHOLESTEROL: 244 mg/dL — AB (ref 125–200)
HDL: 51 mg/dL (ref >=46)
LDL Cholesterol: 149 mg/dL — ABNORMAL HIGH (ref <130)
Triglycerides: 218 mg/dL — ABNORMAL HIGH (ref <150)
VLDL: 44 mg/dL — ABNORMAL HIGH (ref <30)

## 2016-04-09 LAB — CBC WITH DIFFERENTIAL/PLATELET
BASOS ABS: 0 {cells}/uL (ref 0–200)
BASOS PCT: 0 %
EOS ABS: 98 {cells}/uL (ref 15–500)
Eosinophils Relative: 1 %
HEMATOCRIT: 40.7 % (ref 35.0–45.0)
Hemoglobin: 13.1 g/dL (ref 11.7–15.5)
LYMPHS PCT: 30 %
Lymphs Abs: 2940 cells/uL (ref 850–3900)
MCH: 32.3 pg (ref 27.0–33.0)
MCHC: 32.2 g/dL (ref 32.0–36.0)
MCV: 100.2 fL — AB (ref 80.0–100.0)
MONO ABS: 686 {cells}/uL (ref 200–950)
MPV: 9.1 fL (ref 7.5–12.5)
Monocytes Relative: 7 %
Neutro Abs: 6076 cells/uL (ref 1500–7800)
Neutrophils Relative %: 62 %
Platelets: 328 10*3/uL (ref 140–400)
RBC: 4.06 MIL/uL (ref 3.80–5.10)
RDW: 13 % (ref 11.0–15.0)
WBC: 9.8 10*3/uL (ref 3.8–10.8)

## 2016-04-09 LAB — TSH: TSH: 2.76 mIU/L

## 2016-04-10 ENCOUNTER — Ambulatory Visit (INDEPENDENT_AMBULATORY_CARE_PROVIDER_SITE_OTHER): Payer: BLUE CROSS/BLUE SHIELD | Admitting: Internal Medicine

## 2016-04-10 ENCOUNTER — Encounter: Payer: Self-pay | Admitting: Internal Medicine

## 2016-04-10 VITALS — BP 120/92 | HR 101 | Ht 63.0 in | Wt 199.0 lb

## 2016-04-10 DIAGNOSIS — Z0000 Encounter for general adult medical examination without abnormal findings: Secondary | ICD-10-CM | POA: Diagnosis not present

## 2016-04-10 DIAGNOSIS — M1120 Other chondrocalcinosis, unspecified site: Secondary | ICD-10-CM | POA: Diagnosis not present

## 2016-04-10 DIAGNOSIS — I10 Essential (primary) hypertension: Secondary | ICD-10-CM

## 2016-04-10 DIAGNOSIS — F411 Generalized anxiety disorder: Secondary | ICD-10-CM

## 2016-04-10 DIAGNOSIS — Z853 Personal history of malignant neoplasm of breast: Secondary | ICD-10-CM | POA: Diagnosis not present

## 2016-04-10 DIAGNOSIS — L982 Febrile neutrophilic dermatosis [Sweet]: Secondary | ICD-10-CM | POA: Diagnosis not present

## 2016-04-10 DIAGNOSIS — R8290 Unspecified abnormal findings in urine: Secondary | ICD-10-CM | POA: Diagnosis not present

## 2016-04-10 DIAGNOSIS — Z87442 Personal history of urinary calculi: Secondary | ICD-10-CM | POA: Diagnosis not present

## 2016-04-10 DIAGNOSIS — E782 Mixed hyperlipidemia: Secondary | ICD-10-CM | POA: Diagnosis not present

## 2016-04-10 DIAGNOSIS — E876 Hypokalemia: Secondary | ICD-10-CM | POA: Diagnosis not present

## 2016-04-10 LAB — VITAMIN D 25 HYDROXY (VIT D DEFICIENCY, FRACTURES): Vit D, 25-Hydroxy: 29 ng/mL — ABNORMAL LOW (ref 30–100)

## 2016-04-10 LAB — POC URINALSYSI DIPSTICK (AUTOMATED)
BILIRUBIN UA: NEGATIVE
Glucose, UA: NEGATIVE
KETONES UA: NEGATIVE
LEUKOCYTES UA: NEGATIVE
Nitrite, UA: NEGATIVE
PROTEIN UA: NEGATIVE
Urobilinogen, UA: NEGATIVE
pH, UA: 7

## 2016-04-10 MED ORDER — POTASSIUM CHLORIDE CRYS ER 20 MEQ PO TBCR: EXTENDED_RELEASE_TABLET | ORAL | 5 refills | Status: DC

## 2016-04-10 NOTE — Progress Notes (Signed)
Subjective:    Patient ID: Krystal Kane, female    DOB: 28-Sep-1960, 55 y.o.   MRN: [id number]  HPI  55 year old Female for health maintenance exam and evaluation of medical issues.  Social history: One daughter got married, living across road from pt and working at [employer]. One daughter not married with baby.3 daughters total. Husband owns his own Architect business. She helps out with that as well. 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 Does not smoke or consume alcohol.   Step father died recently of prostate cancer which was stressful settling estate and cleaning out house  Family history: Mother died 2012-09-08 from breast cancer had recurrence after 20 plus years. Her father died at age 76 as a result of anesthesia when patient was 67 years old. Maternal aunt died at age 61 of metastatic breast cancer. One sister in good health. Half sister in good health.  History of hypokalemia related to diuretic therapy. History of essential hypertension. History of kidney stones and anxiety. Has been on antihypertensive medications since Sep 09, 2007.  History of right ductal carcinoma in situ status post lumpectomy 09/08/01. Tumor was a poorly differentiated tumor with associated area of microinvasion. Tumor was PR/ER negative. Her2/NEU negative. Chemotherapy was not recommended. She had radiation and has done well however having breast cancer has caused considerable anxiety.  I    Review of Systems   Had episode of hematuria  In September with Positive LE and blood on dipstick with negative culture . Hx kidney stones.   Pseudogout dx recently by orthopedist at [facility] orthopedics treated with naprosyn. Patient had right knee surgery some 5 or 6 years ago for torn meniscus.  History of Sweet syndrome treated with dapsone    Objective:   Physical Exam  Constitutional: She is oriented to person, place, and time. She appears well-developed and  well-nourished. No distress.  HENT:  Head: Normocephalic and atraumatic.  Right Ear: External ear normal.  Left Ear: External ear normal.  Mouth/Throat: Oropharynx is clear and moist.  Eyes: Conjunctivae and EOM are normal. Pupils are equal, round, and reactive to light. Right eye exhibits no discharge. Left eye exhibits no discharge.  Neck: Neck supple. No JVD present. No thyromegaly present.  Cardiovascular: Normal rate, regular rhythm, normal heart sounds and intact distal pulses.   No murmur heard. Breasts normal female  Pulmonary/Chest: No respiratory distress. She has no wheezes. She has no rales.  Abdominal: Soft. Bowel sounds are normal. She exhibits no distension and no mass. There is no tenderness. There is no rebound and no guarding.  Genitourinary:  Genitourinary Comments: S/p hysterectomy. No Pap done. Bimanual normal  Musculoskeletal: She exhibits no edema.  Lymphadenopathy:    She has no cervical adenopathy.  Neurological: She is alert and oriented to person, place, and time. She has normal reflexes. No cranial nerve deficit.  Skin: Skin is warm and dry. No rash noted. She is not diaphoretic.  Psychiatric: She has a normal mood and affect. Her behavior is normal. Judgment and thought content normal.  Vitals reviewed.         Assessment & Plan:  Essential hypertension-blood pressure elevated today. Return in 4 weeks for follow-up. Follow-up with hypokalemia at return appointment in 4 weeks  Hypokalemia secondary to diuretic therapy current on potassium supplement. Potassium is 3.4.  History of breast cancer  Anxiety  History of Sweet syndrome  Recent episode of hematuria-she has  moderate blood on dipstick UA. Culture taken but revealed no growth  History of vitamin D deficiency  History of elevated LDL-  Total cholesterol is now 244 and previously was 2091 year ago. Triglycerides have increased from 161-218. LDL cholesterol increased from 128 to 149. Needs to  work on diet exercise and weight loss. Have her knee is been bothering her preventing exercise  History of kidney stones  Pseudogout-currently being treated within some aid  Plan: Return in 4 weeks for further evaluation and follow-up particular of elevated blood pressure and hypokalemia. Needs some follow-up with hematuria as well.

## 2016-04-12 LAB — URINE CULTURE: ORGANISM ID, BACTERIA: NO GROWTH

## 2016-04-16 ENCOUNTER — Ambulatory Visit (INDEPENDENT_AMBULATORY_CARE_PROVIDER_SITE_OTHER): Payer: BLUE CROSS/BLUE SHIELD | Admitting: Sports Medicine

## 2016-04-16 ENCOUNTER — Encounter (INDEPENDENT_AMBULATORY_CARE_PROVIDER_SITE_OTHER): Payer: Self-pay | Admitting: Sports Medicine

## 2016-04-16 VITALS — BP 157/102 | HR 91 | Ht 63.0 in | Wt 200.0 lb

## 2016-04-16 DIAGNOSIS — M170 Bilateral primary osteoarthritis of knee: Secondary | ICD-10-CM | POA: Diagnosis not present

## 2016-04-16 DIAGNOSIS — Z720 Tobacco use: Secondary | ICD-10-CM | POA: Diagnosis not present

## 2016-04-16 DIAGNOSIS — M25561 Pain in right knee: Secondary | ICD-10-CM | POA: Diagnosis not present

## 2016-04-16 MED ORDER — TRAMADOL HCL 50 MG PO TABS: 50.0000 mg | ORAL_TABLET | Freq: Four times a day (QID) | ORAL | 0 refills | Status: DC | PRN

## 2016-04-16 NOTE — Progress Notes (Signed)
Krystal Kane - 55 y.o. female MRN [id number]  Date of birth: 11/19/1960  Office Visit Note: Visit Date: 04/16/2016 PCP: Elby Showers, MD Referred by: Elby Showers, MD  Assessment & Plan: Visit Diagnoses:  1. Primary osteoarthritis of both knees   2. Acute pain of right knee   3. Tobacco use     Plan:   long discussion today regarding options for persistent ongoing symptoms. Ultimately following her prior knee arthroscopy she had persistent ongoing pain that has been bothering her every day for the past several years to the point that she has to modify her activities & avoid getting down on the floor to play with her grandchildren. The past 6 weeks she has had significant worsening or she is unable to participate many activities of daily living. She had only minimal response to prior injection & is likely a candidate for total knee arthroplasty. Will plan to have her follow up to discuss scheduling total knee arthroplasty with Dr. Ninfa Linden & I emphasized importance of smoking cessation prior to & immediately following joint replacement surgery. Additionally we did discuss injection therapy today but given having to defer surgery until 6 weeks after this on the patient believes she is interested in pursuing surgery prior to this date.     There are no Patient Instructions on file for this visit.  Meds & Orders:  Meds ordered this encounter  Medications  . traMADol (ULTRAM) 50 MG tablet    Sig: Take 1 tablet (50 mg total) by mouth every 6 (six) hours as needed for moderate pain.    Dispense:  30 tablet    Refill:  0   No orders of the defined types were placed in this encounter.   Follow-up: Return for ASAP with Dr. Ninfa Linden.   Procedures: No procedures performed  No notes on file   Clinical History: Findings:  Status post right medial meniscectomy with fairly significant degenerative changes & worsening collapse on plain film x-ray as of 2017.  Bilateral knee OA with R > L  symptoms.   She reports that she has been smoking Cigarettes.  She has been smoking about 0.50 packs per day. She has never used smokeless tobacco. No results for input(s): HGBA1C, LABURIC in the last 8760 hours.  Subjective: Chief Complaint  Patient presents with  . Right Knee - Pain  . Follow-up   HPI: Patient states right knee has been flared up for about 6 weeks.  Swelling in right knee.  Hurts to bend and walk. Currently taking Naproxen.    ROS Otherwise per HPI.  Objective:  VS:  HT:5\' 3"  (160 cm)   WT:200 lb (90.7 kg)  BMI:35.5    BP:(!) 157/102  HR:91bpm  TEMP: ( )  RESP:  Physical Exam  Constitutional: No distress.  HENT:  Head: Normocephalic and atraumatic.  Eyes: Right eye exhibits no discharge. Left eye exhibits no discharge. No scleral icterus.  Pulmonary/Chest: Effort normal. No respiratory distress.  Neurological: She is alert.  Appropriately interactive.  Skin: Skin is warm and dry. No rash noted. She is not diaphoretic. No erythema. No pallor.  Psychiatric: Judgment normal.    Right Knee Exam   Comments:  Moderate tenderness to palpation along medial & lateral joint lines of right knee. Moderate effusion today. 3-4 mm of opening with varus & valgus testing. Anterior & posterior drawer stable. Pain with McMurray's.    Imaging: No results found.  Past Medical/Family/Surgical/Social History: Patient Active Problem List  Diagnosis Date Noted  . 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 Anxiety 09/19/2011  . Osteoarthritis of both knees 09/19/2011  . History of kidney stones 09/19/2011  . Hypertension 12/15/2010  . Obesity 12/15/2010  . Depression 12/15/2010  . History of breast cancer 12/15/2010   Past Medical History:  Diagnosis Date  . Allergy    seasonal  . Arthritis    osteoarthritis  . Asthma   . Cancer ([place])    ductal ca in situ rt breast  . Depression   . Fluid retention   . Hypertension    Family History  Problem Relation Age of Onset    . Cancer Mother   . Hypertension Mother   . Breast cancer Mother   . Hypertension Brother   . Colon cancer Neg Hx    Past Surgical History:  Procedure Laterality Date  . ABDOMINAL HYSTERECTOMY    . APPENDECTOMY    . BREAST SURGERY     rt lumpectomy  . KNEE ARTHROSCOPY  02/2010   right  . LAPAROSCOPIC APPENDECTOMY  10/2010   Social History   Occupational History  . Not on file.   Social History Main Topics  . Smoking status: Current Some Day Smoker    Packs/day: 0.50    Types: Cigarettes  . Smokeless tobacco: Never Used  . Alcohol use 0.0 oz/week  . Drug use: No  . Sexual activity: Not on file

## 2016-04-20 ENCOUNTER — Ambulatory Visit (INDEPENDENT_AMBULATORY_CARE_PROVIDER_SITE_OTHER): Payer: BLUE CROSS/BLUE SHIELD | Admitting: Orthopaedic Surgery

## 2016-04-20 ENCOUNTER — Encounter (INDEPENDENT_AMBULATORY_CARE_PROVIDER_SITE_OTHER): Payer: Self-pay | Admitting: Orthopaedic Surgery

## 2016-04-20 VITALS — Ht 62.0 in | Wt 190.0 lb

## 2016-04-20 DIAGNOSIS — G8929 Other chronic pain: Secondary | ICD-10-CM

## 2016-04-20 DIAGNOSIS — M25561 Pain in right knee: Secondary | ICD-10-CM | POA: Diagnosis not present

## 2016-04-20 DIAGNOSIS — M1711 Unilateral primary osteoarthritis, right knee: Secondary | ICD-10-CM | POA: Diagnosis not present

## 2016-04-20 NOTE — Progress Notes (Signed)
Office Visit Note   Patient: Krystal Kane           Date of Birth: 05/28/1961           MRN: [id number] Visit Date: 04/20/2016              Requested by: Elby Showers, MD [address] [place], [place] [id number] PCP: Elby Showers, MD   Assessment & Plan: Visit Diagnoses:  1. Chronic pain of right knee   2. Unilateral primary osteoarthritis, right knee     Plan: I did show her a knee model and we went over her x-rays. At this point given her tricompartmental arthritis, her daily pain, her decreased mobility and her decreased quality of life, we are recommending a right total knee arthroplasty.  A full discussion of risk and benefits was had. She understands we will set her up for this surgery and then see her back in 2 weeks postoperative for no x-rays will be needed. She would like to have this surgery done before the end of the year. She has tried and failed all forms of conservative treatment.  Follow-Up Instructions: Return for 2 weeks post-op.   Orders:  No orders of the defined types were placed in this encounter.  No orders of the defined types were placed in this encounter.     Procedures: No procedures performed   Clinical Data: No additional findings.   Subjective: Chief Complaint  Patient presents with  . Right Knee - Pain    Right knee pain, swelling, locking, buckling. Saw Dr. Paulla Fore last week. In June, Dr. Paulla Fore did injection which helped to get her through the wedding, but it has been downhill from there.  Had arthroscopy in 2013.Here to discuss total knee.  At this point her pain is 10 out of 10. It is detrimental effect of her quality of life and activities daily living as well as her mobility. Occasionally she has to use an assistive device. She has had an arthroscopic intervention as well as injections and these things at this point have helped. She wishes to discuss knee replacement surgery.  HPI  Review of Systems  Constitutional: Negative.     HENT: Negative.   Respiratory: Negative.   Cardiovascular: Negative.   Gastrointestinal: Negative.   Musculoskeletal: Positive for joint swelling.     Objective: Vital Signs: Ht 5\' 2"  (1.575 m)   Wt 190 lb (86.2 kg)   BMI 34.75 kg/m   Physical Exam  Constitutional: She is oriented to person, place, and time. She appears well-developed and well-nourished.  HENT:  Head: Normocephalic and atraumatic.  Eyes: EOM are normal. Pupils are equal, round, and reactive to light.  Neck: Normal range of motion.  Cardiovascular: Normal rate and regular rhythm.   Pulmonary/Chest: Effort normal and breath sounds normal.  Abdominal: Soft. Bowel sounds are normal.  Neurological: She is alert and oriented to person, place, and time.   She is alert and oriented 3.   Ortho Exam Her right knee has a painful range of motion. There is both medial and lateral joint line tenderness. She has patellofemoral crepitation as well. Her knee is ligamentously stable. There is a mild effusion. Specialty Comments:  No specialty comments available.  Imaging: No results found. X-rays of her right knee that have been done in the past show tricompartmental arthritic changes with bone-on-bone wear in the medial compartment, lateral compartment osteophytes, and significant patellofemoral arthritic changes.  PMFS History: Patient Active Problem List  Diagnosis Date Noted  . 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 Anxiety 09/19/2011  . Osteoarthritis of both knees 09/19/2011  . History of kidney stones 09/19/2011  . Hypertension 12/15/2010  . Obesity 12/15/2010  . Depression 12/15/2010  . History of breast cancer 12/15/2010   Past Medical History:  Diagnosis Date  . Allergy    seasonal  . Arthritis    osteoarthritis  . Asthma   . Cancer ([place])    ductal ca in situ rt breast  . Depression   . Fluid retention   . Hypertension     Family History  Problem Relation Age of Onset  . Cancer Mother   .  Hypertension Mother   . Breast cancer Mother   . Hypertension Brother   . Colon cancer Neg Hx     Past Surgical History:  Procedure Laterality Date  . ABDOMINAL HYSTERECTOMY    . APPENDECTOMY    . BREAST SURGERY     rt lumpectomy  . KNEE ARTHROSCOPY  02/2010   right  . LAPAROSCOPIC APPENDECTOMY  10/2010   Social History   Occupational History  . Not on file.   Social History Main Topics  . Smoking status: Current Some Day Smoker    Packs/day: 0.10    Types: Cigarettes  . Smokeless tobacco: Never Used     Comment: pt is attempting to quit. Smokes 2 a day.   . Alcohol use 0.0 oz/week  . Drug use: No  . Sexual activity: Not on file

## 2016-04-21 NOTE — Patient Instructions (Signed)
Return in 4 weeks for follow-up and further evaluation particular he of elevated blood pressure and hypokalemia. Needs follow-up of hematuria. History of kidney stones in the remote past and recent urine culture was negative.

## 2016-04-27 ENCOUNTER — Other Ambulatory Visit (INDEPENDENT_AMBULATORY_CARE_PROVIDER_SITE_OTHER): Payer: Self-pay | Admitting: Physician Assistant

## 2016-04-29 ENCOUNTER — Ambulatory Visit (INDEPENDENT_AMBULATORY_CARE_PROVIDER_SITE_OTHER): Payer: BLUE CROSS/BLUE SHIELD | Admitting: Orthopaedic Surgery

## 2016-05-01 ENCOUNTER — Encounter (HOSPITAL_COMMUNITY)
Admission: RE | Admit: 2016-05-01 | Discharge: 2016-05-01 | Disposition: A | Discharge status: Home / Self Care | Payer: BLUE CROSS/BLUE SHIELD | Source: Ambulatory Visit | Attending: Orthopaedic Surgery | Admitting: Orthopaedic Surgery

## 2016-05-01 ENCOUNTER — Encounter (HOSPITAL_COMMUNITY): Payer: Self-pay

## 2016-05-01 DIAGNOSIS — Z01812 Encounter for preprocedural laboratory examination: Secondary | ICD-10-CM | POA: Insufficient documentation

## 2016-05-01 HISTORY — DX: Personal history of urinary calculi: Z87.442

## 2016-05-01 HISTORY — DX: Family history of other specified conditions: Z84.89

## 2016-05-01 LAB — CBC
HEMATOCRIT: 42.3 % (ref 36.0–46.0)
HEMOGLOBIN: 14.2 g/dL (ref 12.0–15.0)
MCH: 32.3 pg (ref 26.0–34.0)
MCHC: 33.6 g/dL (ref 30.0–36.0)
MCV: 96.4 fL (ref 78.0–100.0)
Platelets: 297 10*3/uL (ref 150–400)
RBC: 4.39 MIL/uL (ref 3.87–5.11)
RDW: 12.6 % (ref 11.5–15.5)
WBC: 9.1 10*3/uL (ref 4.0–10.5)

## 2016-05-01 LAB — BASIC METABOLIC PANEL
ANION GAP: 9 (ref 5–15)
BUN: 11 mg/dL (ref 6–20)
CALCIUM: 9.2 mg/dL (ref 8.9–10.3)
CO2: 28 mmol/L (ref 22–32)
Chloride: 104 mmol/L (ref 101–111)
Creatinine, Ser: 0.65 mg/dL (ref 0.44–1.00)
GFR calc non Af Amer: >60 mL/min (ref >60)
Glucose, Bld: 105 mg/dL — ABNORMAL HIGH (ref 65–99)
POTASSIUM: 4.2 mmol/L (ref 3.5–5.1)
Sodium: 141 mmol/L (ref 135–145)

## 2016-05-01 LAB — SURGICAL PCR SCREEN
MRSA, PCR: NEGATIVE
STAPHYLOCOCCUS AUREUS: NEGATIVE

## 2016-05-01 NOTE — Patient Instructions (Addendum)
Krystal Kane  05/01/2016   Your procedure is scheduled on: 05/08/16  Report to [facility]  Entrance take [facility]  elevators to 3rd floor to  K[facility] Kane at 9:30 AM.  Call this number if you have problems the Krystal of surgery [phone]   Remember: ONLY 1 PERSON MAY GO WITH YOU TO SHORT STAY TO GET  READY K[facility] OF [facility].  Do not eat food or drink liquids :After Midnight.     Take these medicines the Krystal of surgery with A SIP OF WATER: Amlodipine (Norvasc)                               You may not have any metal on your body including hair pins and              piercings  Do not wear jewelry, make-up, lotions, powders or perfumes, deodorant             Do not wear nail polish.  Do not shave  48 hours prior to surgery.              Men may shave face and neck.   Do not bring valuables to the hospital. [facility].  Contacts, dentures or bridgework may not be worn into surgery.  Leave suitcase in the car. After surgery it may be brought to your room.               Please read over the following fact sheets you were given: _____________________________________________________________________             [facility] - Preparing for Surgery Before surgery, you can play an important role.  Because skin is not sterile, your skin needs to be as free of germs as possible.  You can reduce the number of germs on your skin by washing with CHG (chlorahexidine gluconate) soap before surgery.  CHG is an antiseptic cleaner which kills germs and bonds with the skin to continue killing germs even after washing. Please DO NOT use if you have an allergy to CHG or antibacterial soaps.  If your skin becomes reddened/irritated stop using the CHG and inform your nurse when you arrive at Short Stay. Do not shave (including legs and underarms) for at least 48 hours prior to the first CHG shower.  You may shave your  face/neck. Please follow these instructions carefully:  1.  Shower with CHG Soap the night before surgery and the  K[facility] of Surgery.  2.  If you choose to wash your hair, wash your hair first as usual with your  normal  shampoo.  3.  After you shampoo, rinse your hair and body thoroughly to remove the  shampoo.                           4.  Use CHG as you would any other liquid soap.  You can apply chg directly  to the skin and wash                       Gently with a scrungie or clean washcloth.  5.  Apply the CHG Soap to your body  ONLY FROM THE NECK DOWN.   Do not use on face/ open                           Wound or open sores. Avoid contact with eyes, ears mouth and genitals (private parts).                       Wash face,  Genitals (private parts) with your normal soap.             6.  Wash thoroughly, paying special attention to the area where your surgery  will be performed.  7.  Thoroughly rinse your body with warm water from the neck down.  8.  DO NOT shower/wash with your normal soap after using and rinsing off  the CHG Soap.                9.  Pat yourself dry with a clean towel.            10.  Wear clean pajamas.            11.  Place clean sheets on your bed the night of your first shower and do not  sleep with pets. Day of Surgery : Do not apply any lotions/deodorants the Krystal of surgery.  Please wear clean clothes to the hospital/surgery [facility].  FAILURE TO FOLLOW THESE INSTRUCTIONS MAY RESULT IN THE CANCELLATION OF YOUR SURGERY PATIENT SIGNATURE_________________________________  NURSE SIGNATURE__________________________________  ________________________________________________________________________   Adam Phenix  An incentive spirometer is a tool that can help keep your lungs clear and active. This tool measures how well you are filling your lungs with each breath. Taking long deep breaths may help reverse or decrease the chance of developing breathing  (pulmonary) problems (especially infection) following:  A long period of time when you are unable to move or be active. BEFORE THE PROCEDURE   If the spirometer includes an indicator to show your best effort, your nurse or respiratory therapist will set it to a desired goal.  If possible, sit up straight or lean slightly forward. Try not to slouch.  Hold the incentive spirometer in an upright position. INSTRUCTIONS FOR USE  1. Sit on the edge of your bed if possible, or sit up as far as you can in bed or on a chair. 2. Hold the incentive spirometer in an upright position. 3. Breathe out normally. 4. Place the mouthpiece in your mouth and seal your lips tightly around it. 5. Breathe in slowly and as deeply as possible, raising the piston or the ball toward the top of the column. 6. Hold your breath for 3-5 seconds or for as long as possible. Allow the piston or ball to fall to the bottom of the column. 7. Remove the mouthpiece from your mouth and breathe out normally. 8. Rest for a few seconds and repeat Steps 1 through 7 at least 10 times every 1-2 hours when you are awake. Take your time and take a few normal breaths between deep breaths. 9. The spirometer may include an indicator to show your best effort. Use the indicator as a goal to work toward during each repetition. 10. After each set of 10 deep breaths, practice coughing to be sure your lungs are clear. If you have an incision (the cut made at the time of surgery), support your incision when coughing by placing a pillow or rolled up towels firmly against it. Once  you are able to get out of bed, walk around indoors and cough well. You may stop using the incentive spirometer when instructed by your caregiver.  RISKS AND COMPLICATIONS  Take your time so you do not get dizzy or light-headed.  If you are in pain, you may need to take or ask for pain medication before doing incentive spirometry. It is harder to take a deep breath if you  are having pain. AFTER USE  Rest and breathe slowly and easily.  It can be helpful to keep track of a log of your progress. Your caregiver can provide you with a simple table to help with this. If you are using the spirometer at home, follow these instructions: [facility] IF:   You are having difficultly using the spirometer.  You have trouble using the spirometer as often as instructed.  Your pain medication is not giving enough relief while using the spirometer.  You develop fever of 100.5 F (38.1 C) or higher. SEEK IMMEDIATE MEDICAL CARE IF:   You cough up bloody sputum that had not been present before.  You develop fever of 102 F (38.9 C) or greater.  You develop worsening pain at or near the incision site. MAKE SURE YOU:   Understand these instructions.  Will watch your condition.  Will get help right away if you are not doing well or get worse. Document Released: 10/19/2006 Document Revised: 08/31/2011 Document Reviewed: 12/20/2006 [facility] Patient Information 2014 [place], [place].

## 2016-05-01 NOTE — Pre-Procedure Instructions (Addendum)
[facility] ED Visit, with EKG, CXR 01-10-16, on chart  Pt's BP is 143/107. Pt states she took her Amlodipine this AM.  Recommended pt visits PCP prior to surgery and make sure BP is appropriately managed.  She states she is currently in pain and is very nervous about appointment.

## 2016-05-01 NOTE — Progress Notes (Signed)
   05/01/16 1013  OBSTRUCTIVE SLEEP APNEA  Have you ever been diagnosed with sleep apnea through a sleep study? No  Do you snore loudly (loud enough to be heard through closed doors)?  1  Do you often feel tired, fatigued, or sleepy during the daytime (such as falling asleep during driving or talking to someone)? 0  Has anyone observed you stop breathing during your sleep? 0  Do you have, or are you being treated for high blood pressure? 1  BMI more than 35 kg/m2? 1  Age > 50 (1-yes) 1  Neck circumference greater than:Female 16 inches or larger, Female 17inches or larger? 0  Female Gender (Yes=1) 0  Obstructive Sleep Apnea Score 4  Score 5 or greater  Results sent to PCP

## 2016-05-06 ENCOUNTER — Other Ambulatory Visit: Payer: Self-pay | Admitting: Internal Medicine

## 2016-05-08 ENCOUNTER — Inpatient Hospital Stay (HOSPITAL_COMMUNITY): Payer: BLUE CROSS/BLUE SHIELD | Admitting: Anesthesiology

## 2016-05-08 ENCOUNTER — Encounter (HOSPITAL_COMMUNITY)
Admission: RE | Disposition: A | Discharge status: Home Health | Payer: Self-pay | Source: Ambulatory Visit | Attending: Orthopaedic Surgery

## 2016-05-08 ENCOUNTER — Inpatient Hospital Stay (HOSPITAL_COMMUNITY): Payer: BLUE CROSS/BLUE SHIELD

## 2016-05-08 ENCOUNTER — Encounter (HOSPITAL_COMMUNITY): Payer: Self-pay | Admitting: *Deleted

## 2016-05-08 ENCOUNTER — Inpatient Hospital Stay (HOSPITAL_COMMUNITY)
Admission: RE | Admit: 2016-05-08 | Discharge: 2016-05-11 | DRG: 470 | Disposition: A | Discharge status: Home Health | Payer: BLUE CROSS/BLUE SHIELD | Source: Ambulatory Visit | Attending: Orthopaedic Surgery | Admitting: Orthopaedic Surgery

## 2016-05-08 DIAGNOSIS — Z803 Family history of malignant neoplasm of breast: Secondary | ICD-10-CM | POA: Diagnosis not present

## 2016-05-08 DIAGNOSIS — Z853 Personal history of malignant neoplasm of breast: Secondary | ICD-10-CM | POA: Diagnosis not present

## 2016-05-08 DIAGNOSIS — F17210 Nicotine dependence, cigarettes, uncomplicated: Secondary | ICD-10-CM | POA: Diagnosis present

## 2016-05-08 DIAGNOSIS — Z96651 Presence of right artificial knee joint: Secondary | ICD-10-CM | POA: Diagnosis not present

## 2016-05-08 DIAGNOSIS — I10 Essential (primary) hypertension: Secondary | ICD-10-CM | POA: Diagnosis not present

## 2016-05-08 DIAGNOSIS — Z6838 Body mass index (BMI) 38.0-38.9, adult: Secondary | ICD-10-CM | POA: Diagnosis not present

## 2016-05-08 DIAGNOSIS — Z87442 Personal history of urinary calculi: Secondary | ICD-10-CM

## 2016-05-08 DIAGNOSIS — F418 Other specified anxiety disorders: Secondary | ICD-10-CM | POA: Diagnosis not present

## 2016-05-08 DIAGNOSIS — Z8249 Family history of ischemic heart disease and other diseases of the circulatory system: Secondary | ICD-10-CM

## 2016-05-08 DIAGNOSIS — R269 Unspecified abnormalities of gait and mobility: Secondary | ICD-10-CM | POA: Diagnosis not present

## 2016-05-08 DIAGNOSIS — G8918 Other acute postprocedural pain: Secondary | ICD-10-CM | POA: Diagnosis not present

## 2016-05-08 DIAGNOSIS — Z471 Aftercare following joint replacement surgery: Secondary | ICD-10-CM | POA: Diagnosis not present

## 2016-05-08 DIAGNOSIS — M1711 Unilateral primary osteoarthritis, right knee: Secondary | ICD-10-CM | POA: Diagnosis not present

## 2016-05-08 DIAGNOSIS — E669 Obesity, unspecified: Secondary | ICD-10-CM | POA: Diagnosis present

## 2016-05-08 HISTORY — PX: TOTAL KNEE ARTHROPLASTY: SHX125

## 2016-05-08 SURGERY — ARTHROPLASTY, KNEE, TOTAL
Anesthesia: Regional | Site: Knee | Laterality: Right

## 2016-05-08 MED ORDER — ACETAMINOPHEN 325 MG PO TABS
650.0000 mg | ORAL_TABLET | Freq: Four times a day (QID) | ORAL | Status: DC | PRN
  Administered 2016-05-10 – 2016-05-11 (×4): 650 mg via ORAL
  Filled 2016-05-08 (×4): qty 2

## 2016-05-08 MED ORDER — RAMIPRIL 10 MG PO CAPS
10.0000 mg | ORAL_CAPSULE | Freq: Every day | ORAL | Status: DC
Start: 2016-05-09 — End: 2016-05-11
  Administered 2016-05-09 – 2016-05-11 (×3): 10 mg via ORAL
  Filled 2016-05-08 (×3): qty 1

## 2016-05-08 MED ORDER — OXYCODONE HCL 5 MG PO TABS
5.0000 mg | ORAL_TABLET | ORAL | Status: DC | PRN
  Administered 2016-05-08 (×2): 5 mg via ORAL
  Administered 2016-05-08 – 2016-05-09 (×2): 10 mg via ORAL
  Administered 2016-05-09: 5 mg via ORAL
  Administered 2016-05-09: 10 mg via ORAL
  Administered 2016-05-09 (×2): 5 mg via ORAL
  Administered 2016-05-10 – 2016-05-11 (×10): 10 mg via ORAL
  Filled 2016-05-08 (×5): qty 2
  Filled 2016-05-08: qty 1
  Filled 2016-05-08 (×7): qty 2
  Filled 2016-05-08 (×2): qty 1
  Filled 2016-05-08: qty 2
  Filled 2016-05-08: qty 1
  Filled 2016-05-08: qty 2

## 2016-05-08 MED ORDER — BUPIVACAINE IN DEXTROSE 0.75-8.25 % IT SOLN
INTRATHECAL | Status: DC | PRN
  Administered 2016-05-08: 1.8 mL via INTRATHECAL

## 2016-05-08 MED ORDER — SODIUM CHLORIDE 0.9 % IV SOLN
INTRAVENOUS | Status: DC
  Administered 2016-05-08: 75 mL/h via INTRAVENOUS

## 2016-05-08 MED ORDER — MEPERIDINE HCL 50 MG/ML IJ SOLN: 6.2500 mg | INTRAMUSCULAR | Status: DC | PRN

## 2016-05-08 MED ORDER — PROPOFOL 10 MG/ML IV BOLUS
INTRAVENOUS | Status: AC
  Filled 2016-05-08: qty 20

## 2016-05-08 MED ORDER — MIDAZOLAM HCL 5 MG/5ML IJ SOLN
INTRAMUSCULAR | Status: DC | PRN
  Administered 2016-05-08: 2 mg via INTRAVENOUS

## 2016-05-08 MED ORDER — ALUM & MAG HYDROXIDE-SIMETH 200-200-20 MG/5ML PO SUSP: 30.0000 mL | ORAL | Status: DC | PRN

## 2016-05-08 MED ORDER — METHOCARBAMOL 500 MG PO TABS
500.0000 mg | ORAL_TABLET | Freq: Four times a day (QID) | ORAL | Status: DC | PRN
  Administered 2016-05-08 – 2016-05-10 (×7): 500 mg via ORAL
  Filled 2016-05-08 (×7): qty 1

## 2016-05-08 MED ORDER — PROPOFOL 500 MG/50ML IV EMUL
INTRAVENOUS | Status: DC | PRN
  Administered 2016-05-08: 120 ug/kg/min via INTRAVENOUS

## 2016-05-08 MED ORDER — HYDROMORPHONE HCL 1 MG/ML IJ SOLN
1.0000 mg | INTRAMUSCULAR | Status: DC | PRN
  Administered 2016-05-08 – 2016-05-10 (×4): 1 mg via INTRAVENOUS
  Filled 2016-05-08 (×4): qty 1

## 2016-05-08 MED ORDER — VITAMIN D 1000 UNITS PO TABS
2000.0000 [IU] | ORAL_TABLET | Freq: Every day | ORAL | Status: DC
  Administered 2016-05-09 – 2016-05-11 (×3): 2000 [IU] via ORAL
  Filled 2016-05-08 (×3): qty 2

## 2016-05-08 MED ORDER — POLYETHYLENE GLYCOL 3350 17 G PO PACK: 17.0000 g | PACK | Freq: Every day | ORAL | Status: DC | PRN

## 2016-05-08 MED ORDER — CEFAZOLIN IN D5W 1 GM/50ML IV SOLN
1.0000 g | Freq: Four times a day (QID) | INTRAVENOUS | Status: AC
  Administered 2016-05-08 – 2016-05-09 (×2): 1 g via INTRAVENOUS
  Filled 2016-05-08 (×2): qty 50

## 2016-05-08 MED ORDER — CEFAZOLIN SODIUM-DEXTROSE 2-4 GM/100ML-% IV SOLN
2.0000 g | INTRAVENOUS | Status: AC
  Administered 2016-05-08: 2 g via INTRAVENOUS
  Filled 2016-05-08: qty 100

## 2016-05-08 MED ORDER — METOCLOPRAMIDE HCL 5 MG PO TABS
5.0000 mg | ORAL_TABLET | Freq: Three times a day (TID) | ORAL | Status: DC | PRN
  Administered 2016-05-09: 5 mg via ORAL
  Filled 2016-05-08: qty 1

## 2016-05-08 MED ORDER — RIVAROXABAN 10 MG PO TABS
10.0000 mg | ORAL_TABLET | Freq: Every day | ORAL | Status: DC
  Administered 2016-05-09 – 2016-05-11 (×3): 10 mg via ORAL
  Filled 2016-05-08 (×3): qty 1

## 2016-05-08 MED ORDER — PHENOL 1.4 % MT LIQD: 1.0000 | OROMUCOSAL | Status: DC | PRN

## 2016-05-08 MED ORDER — MENTHOL 3 MG MT LOZG: 1.0000 | LOZENGE | OROMUCOSAL | Status: DC | PRN

## 2016-05-08 MED ORDER — PROPOFOL 10 MG/ML IV BOLUS
INTRAVENOUS | Status: DC | PRN
  Administered 2016-05-08: 20 mg via INTRAVENOUS

## 2016-05-08 MED ORDER — HYDROMORPHONE HCL 1 MG/ML IJ SOLN
INTRAMUSCULAR | Status: AC
  Administered 2016-05-09: 1 mg via INTRAVENOUS
  Filled 2016-05-08: qty 1

## 2016-05-08 MED ORDER — DOCUSATE SODIUM 100 MG PO CAPS
100.0000 mg | ORAL_CAPSULE | Freq: Two times a day (BID) | ORAL | Status: DC
  Administered 2016-05-08 – 2016-05-11 (×4): 100 mg via ORAL
  Filled 2016-05-08 (×4): qty 1

## 2016-05-08 MED ORDER — AMLODIPINE BESYLATE 5 MG PO TABS
5.0000 mg | ORAL_TABLET | Freq: Every day | ORAL | Status: DC
  Administered 2016-05-09 – 2016-05-11 (×3): 5 mg via ORAL
  Filled 2016-05-08 (×3): qty 1

## 2016-05-08 MED ORDER — ACETAMINOPHEN 650 MG RE SUPP: 650.0000 mg | Freq: Four times a day (QID) | RECTAL | Status: DC | PRN

## 2016-05-08 MED ORDER — ONDANSETRON HCL 4 MG/2ML IJ SOLN
4.0000 mg | Freq: Four times a day (QID) | INTRAMUSCULAR | Status: DC | PRN
  Administered 2016-05-09 – 2016-05-10 (×2): 4 mg via INTRAVENOUS
  Filled 2016-05-08 (×2): qty 2

## 2016-05-08 MED ORDER — METHOCARBAMOL 1000 MG/10ML IJ SOLN
500.0000 mg | Freq: Four times a day (QID) | INTRAVENOUS | Status: DC | PRN
  Administered 2016-05-08: 500 mg via INTRAVENOUS
  Filled 2016-05-08: qty 550
  Filled 2016-05-08: qty 5

## 2016-05-08 MED ORDER — DIPHENHYDRAMINE HCL 12.5 MG/5ML PO ELIX
12.5000 mg | ORAL_SOLUTION | ORAL | Status: DC | PRN
  Administered 2016-05-10 (×2): 12.5 mg via ORAL
  Filled 2016-05-08: qty 10

## 2016-05-08 MED ORDER — FENTANYL CITRATE (PF) 100 MCG/2ML IJ SOLN
INTRAMUSCULAR | Status: AC
  Filled 2016-05-08: qty 2

## 2016-05-08 MED ORDER — KETOROLAC TROMETHAMINE 15 MG/ML IJ SOLN
7.5000 mg | Freq: Four times a day (QID) | INTRAMUSCULAR | Status: AC
  Administered 2016-05-08 – 2016-05-09 (×4): 7.5 mg via INTRAVENOUS
  Filled 2016-05-08 (×4): qty 1

## 2016-05-08 MED ORDER — CEFAZOLIN SODIUM-DEXTROSE 2-4 GM/100ML-% IV SOLN
INTRAVENOUS | Status: AC
  Filled 2016-05-08: qty 100

## 2016-05-08 MED ORDER — CHLORHEXIDINE GLUCONATE 4 % EX LIQD: 60.0000 mL | Freq: Once | CUTANEOUS | Status: DC

## 2016-05-08 MED ORDER — PROPOFOL 10 MG/ML IV BOLUS
INTRAVENOUS | Status: AC
  Filled 2016-05-08: qty 40

## 2016-05-08 MED ORDER — FENTANYL CITRATE (PF) 100 MCG/2ML IJ SOLN
INTRAMUSCULAR | Status: DC | PRN
  Administered 2016-05-08 (×2): 50 ug via INTRAVENOUS

## 2016-05-08 MED ORDER — METOCLOPRAMIDE HCL 5 MG/ML IJ SOLN: 5.0000 mg | Freq: Three times a day (TID) | INTRAMUSCULAR | Status: DC | PRN

## 2016-05-08 MED ORDER — ONDANSETRON HCL 4 MG/2ML IJ SOLN
INTRAMUSCULAR | Status: DC | PRN
  Administered 2016-05-08: 4 mg via INTRAVENOUS

## 2016-05-08 MED ORDER — FUROSEMIDE 20 MG PO TABS
20.0000 mg | ORAL_TABLET | Freq: Every day | ORAL | Status: DC
  Administered 2016-05-09 – 2016-05-11 (×3): 20 mg via ORAL
  Filled 2016-05-08 (×3): qty 1

## 2016-05-08 MED ORDER — ONDANSETRON HCL 4 MG/2ML IJ SOLN
INTRAMUSCULAR | Status: AC
  Filled 2016-05-08: qty 2

## 2016-05-08 MED ORDER — ONDANSETRON HCL 4 MG PO TABS: 4.0000 mg | ORAL_TABLET | Freq: Four times a day (QID) | ORAL | Status: DC | PRN

## 2016-05-08 MED ORDER — MIDAZOLAM HCL 2 MG/2ML IJ SOLN
INTRAMUSCULAR | Status: AC
  Filled 2016-05-08: qty 2

## 2016-05-08 MED ORDER — TRANEXAMIC ACID 1000 MG/10ML IV SOLN
1000.0000 mg | INTRAVENOUS | Status: AC
  Administered 2016-05-08: 1000 mg via INTRAVENOUS
  Filled 2016-05-08: qty 1100

## 2016-05-08 MED ORDER — PROMETHAZINE HCL 25 MG/ML IJ SOLN: 6.2500 mg | INTRAMUSCULAR | Status: DC | PRN

## 2016-05-08 MED ORDER — POTASSIUM CHLORIDE CRYS ER 20 MEQ PO TBCR
40.0000 meq | EXTENDED_RELEASE_TABLET | Freq: Every day | ORAL | Status: DC
  Administered 2016-05-09 – 2016-05-11 (×3): 40 meq via ORAL
  Filled 2016-05-08 (×3): qty 2

## 2016-05-08 MED ORDER — BUPIVACAINE HCL (PF) 0.5 % IJ SOLN
INTRAMUSCULAR | Status: AC
  Filled 2016-05-08: qty 30

## 2016-05-08 MED ORDER — HYDROMORPHONE HCL 1 MG/ML IJ SOLN
0.2500 mg | INTRAMUSCULAR | Status: DC | PRN
  Administered 2016-05-08 (×2): 0.5 mg via INTRAVENOUS

## 2016-05-08 MED ORDER — BUPIVACAINE HCL (PF) 0.5 % IJ SOLN
INTRAMUSCULAR | Status: DC | PRN
  Administered 2016-05-08: 30 mL via PERINEURAL

## 2016-05-08 MED ORDER — SODIUM CHLORIDE 0.9 % IR SOLN
Status: DC | PRN
  Administered 2016-05-08: 3000 mL

## 2016-05-08 MED ORDER — DEXAMETHASONE SODIUM PHOSPHATE 10 MG/ML IJ SOLN
INTRAMUSCULAR | Status: DC | PRN
  Administered 2016-05-08: 10 mg via INTRAVENOUS

## 2016-05-08 MED ORDER — ZOLPIDEM TARTRATE 5 MG PO TABS: 5.0000 mg | ORAL_TABLET | Freq: Every evening | ORAL | Status: DC | PRN

## 2016-05-08 MED ORDER — LACTATED RINGERS IV SOLN
INTRAVENOUS | Status: DC
  Administered 2016-05-08 (×2): via INTRAVENOUS

## 2016-05-08 SURGICAL SUPPLY — 54 items
APL SKNCLS STERI-STRIP NONHPOA (GAUZE/BANDAGES/DRESSINGS) ×1
BAG SPEC THK2 15X12 ZIP CLS (MISCELLANEOUS)
BAG ZIPLOCK 12X15 (MISCELLANEOUS) IMPLANT
BANDAGE ACE 6X5 VEL STRL LF (GAUZE/BANDAGES/DRESSINGS) ×2 IMPLANT
BANDAGE ELASTIC 6 VELCRO ST LF (GAUZE/BANDAGES/DRESSINGS) ×2 IMPLANT
BENZOIN TINCTURE PRP APPL 2/3 (GAUZE/BANDAGES/DRESSINGS) ×2 IMPLANT
BLADE SAG 13.0X1.37X90 (BLADE) IMPLANT
BLADE SAG 18X100X1.27 (BLADE) ×2 IMPLANT
BOWL SMART MIX CTS (DISPOSABLE) ×2 IMPLANT
CAPT KNEE TOTAL 3 ×2 IMPLANT
CEMENT BONE SIMPLEX SPEEDSET (Cement) ×4 IMPLANT
CLOTH BEACON ORANGE TIMEOUT ST (SAFETY) ×2 IMPLANT
CUFF TOURN SGL QUICK 34 (TOURNIQUET CUFF) ×2
CUFF TRNQT CYL 34X4X40X1 (TOURNIQUET CUFF) ×1 IMPLANT
DRAPE U-SHAPE 47X51 STRL (DRAPES) ×2 IMPLANT
DRSG AQUACEL AG ADV 3.5X10 (GAUZE/BANDAGES/DRESSINGS) ×2 IMPLANT
DRSG PAD ABDOMINAL 8X10 ST (GAUZE/BANDAGES/DRESSINGS) ×2 IMPLANT
DURAPREP 26ML APPLICATOR (WOUND CARE) ×2 IMPLANT
ELECT REM PT RETURN 9FT ADLT (ELECTROSURGICAL) ×2
ELECTRODE REM PT RTRN 9FT ADLT (ELECTROSURGICAL) ×1 IMPLANT
GAUZE SPONGE 4X4 12PLY STRL (GAUZE/BANDAGES/DRESSINGS) ×2 IMPLANT
GAUZE XEROFORM 1X8 LF (GAUZE/BANDAGES/DRESSINGS) IMPLANT
GLOVE BIO SURGEON STRL SZ7.5 (GLOVE) ×2 IMPLANT
GLOVE BIOGEL PI IND STRL 8 (GLOVE) ×2 IMPLANT
GLOVE BIOGEL PI INDICATOR 8 (GLOVE) ×2
GLOVE ECLIPSE 8.0 STRL XLNG CF (GLOVE) ×2 IMPLANT
GOWN STRL REUS W/TWL XL LVL3 (GOWN DISPOSABLE) ×4 IMPLANT
HANDPIECE INTERPULSE COAX TIP (DISPOSABLE) ×2
IMMOBILIZER KNEE 20 (SOFTGOODS) ×2
IMMOBILIZER KNEE 20 THIGH 36 (SOFTGOODS) ×1 IMPLANT
NS IRRIG 1000ML POUR BTL (IV SOLUTION) ×2 IMPLANT
PACK TOTAL KNEE CUSTOM (KITS) ×2 IMPLANT
PAD ABD 8X10 STRL (GAUZE/BANDAGES/DRESSINGS) ×2 IMPLANT
PADDING CAST COTTON 6X4 STRL (CAST SUPPLIES) ×2 IMPLANT
POSITIONER SURGICAL ARM (MISCELLANEOUS) ×2 IMPLANT
SET HNDPC FAN SPRY TIP SCT (DISPOSABLE) ×1 IMPLANT
SET PAD KNEE POSITIONER (MISCELLANEOUS) ×2 IMPLANT
STAPLER VISISTAT 35W (STAPLE) IMPLANT
STRIP CLOSURE SKIN 1/2X4 (GAUZE/BANDAGES/DRESSINGS) ×2 IMPLANT
SUCTION FRAZIER HANDLE 12FR (TUBING) ×1
SUCTION TUBE FRAZIER 12FR DISP (TUBING) ×1 IMPLANT
SUT MNCRL AB 4-0 PS2 18 (SUTURE) IMPLANT
SUT VIC AB 0 CT1 27 (SUTURE) ×1
SUT VIC AB 0 CT1 27XBRD ANTBC (SUTURE) ×1 IMPLANT
SUT VIC AB 1 CT1 27 (SUTURE) ×4
SUT VIC AB 1 CT1 27XBRD ANTBC (SUTURE) ×2 IMPLANT
SUT VIC AB 1 CT1 36 (SUTURE) ×2 IMPLANT
SUT VIC AB 2-0 CT1 27 (SUTURE) ×4
SUT VIC AB 2-0 CT1 TAPERPNT 27 (SUTURE) ×2 IMPLANT
TRAY FOLEY W/METER SILVER 16FR (SET/KITS/TRAYS/PACK) ×2 IMPLANT
WATER STERILE IRR 1000ML POUR (IV SOLUTION) ×4 IMPLANT
WATER STERILE IRR 1500ML POUR (IV SOLUTION) ×2 IMPLANT
WRAP KNEE MAXI GEL POST OP (GAUZE/BANDAGES/DRESSINGS) ×2 IMPLANT
YANKAUER SUCT BULB TIP 10FT TU (MISCELLANEOUS) ×2 IMPLANT

## 2016-05-08 NOTE — Anesthesia Postprocedure Evaluation (Signed)
Anesthesia Post Note  Patient: Krystal Kane  Procedure(s) Performed: Procedure(s) (LRB): RIGHT TOTAL KNEE ARTHROPLASTY (Right)  Patient location during evaluation: PACU Anesthesia Type: Spinal and Regional Level of consciousness: awake and alert Pain management: pain level controlled Vital Signs Assessment: post-procedure vital signs reviewed and stable Respiratory status: spontaneous breathing and respiratory function stable Cardiovascular status: blood pressure returned to baseline and stable Postop Assessment: spinal receding Anesthetic complications: no    Last Vitals:  Vitals:   05/08/16 1557 05/08/16 1701  BP: (!) 157/91 117/82  Pulse: 69 85  Resp: 14 14  Temp: 36.9 C     Last Pain:  Vitals:   05/08/16 1719  TempSrc:   PainSc: 2                  Nolon Nations

## 2016-05-08 NOTE — Anesthesia Preprocedure Evaluation (Addendum)
Anesthesia Evaluation  Patient identified by MRN, date of birth, ID band Patient awake    Reviewed: Allergy & Precautions, NPO status , Patient's Chart, lab work & pertinent test results  History of Anesthesia Complications (+) Family history of anesthesia reaction  Airway Mallampati: II  TM Distance: >3 FB Neck ROM: Full    Dental no notable dental hx.    Pulmonary neg pulmonary ROS, Current Smoker,    Pulmonary exam normal breath sounds clear to auscultation       Cardiovascular hypertension, Normal cardiovascular exam Rhythm:Regular Rate:Normal     Neuro/Psych PSYCHIATRIC DISORDERS Anxiety Depression negative neurological ROS     GI/Hepatic negative GI ROS, Neg liver ROS,   Endo/Other  negative endocrine ROS  Renal/GU negative Renal ROS     Musculoskeletal negative musculoskeletal ROS (+) Arthritis ,   Abdominal   Peds  Hematology negative hematology ROS (+)   Anesthesia Other Findings   Reproductive/Obstetrics negative OB ROS                             Anesthesia Physical Anesthesia Plan  ASA: II  Anesthesia Plan: Spinal and Regional   Post-op Pain Management:  Regional for Post-op pain   Induction: Intravenous  Airway Management Planned:   Additional Equipment:   Intra-op Plan:   Post-operative Plan:   Informed Consent: I have reviewed the patients History and Physical, chart, labs and discussed the procedure including the risks, benefits and alternatives for the proposed anesthesia with the patient or authorized representative who has indicated his/her understanding and acceptance.   Dental advisory given  Plan Discussed with: CRNA  Anesthesia Plan Comments:        Anesthesia Quick Evaluation

## 2016-05-08 NOTE — Progress Notes (Signed)
Portable AP and Lateral Right Knee X-rays done. 

## 2016-05-08 NOTE — H&P (Signed)
TOTAL KNEE ADMISSION H&P  Patient is being admitted for right total knee arthroplasty.  Subjective:  Chief Complaint:right knee pain.  HPI: Krystal Kane, 55 y.o. female, has a history of pain and functional disability in the right knee due to arthritis and has failed non-surgical conservative treatments for greater than 12 weeks to includeNSAID's and/or analgesics, corticosteriod injections, flexibility and strengthening excercises, use of assistive devices, weight reduction as appropriate and activity modification.  Onset of symptoms was gradual, starting 3 years ago with gradually worsening course since that time. The patient noted prior procedures on the knee to include  arthroscopy on the right knee(s).  Patient currently rates pain in the right knee(s) at 10 out of 10 with activity. Patient has night pain, worsening of pain with activity and weight bearing, pain that interferes with activities of daily living, pain with passive range of motion, crepitus and joint swelling.  Patient has evidence of subchondral sclerosis, periarticular osteophytes and joint space narrowing by imaging studies. There is no active infection.  Patient Active Problem List   Diagnosis Date Noted  . 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 Anxiety 09/19/2011  . Unilateral primary osteoarthritis, right knee 09/19/2011  . History of kidney stones 09/19/2011  . Hypertension 12/15/2010  . Obesity 12/15/2010  . Depression 12/15/2010  . History of breast cancer 12/15/2010   Past Medical History:  Diagnosis Date  . Allergy    seasonal  . Arthritis    osteoarthritis  . Cancer ([place])    ductal ca in situ rt breast  . Depression   . Family history of adverse reaction to anesthesia    Father died from reaction to Anesthesia 1971  . Fluid retention   . History of kidney stones   . Hypertension     Past Surgical History:  Procedure Laterality Date  . ABDOMINAL HYSTERECTOMY    . APPENDECTOMY    . BREAST SURGERY     rt  lumpectomy  . KNEE ARTHROSCOPY  02/2010   right  . LAPAROSCOPIC APPENDECTOMY  10/2010    No prescriptions prior to admission.   No Known Allergies  Social History  Substance Use Topics  . Smoking status: Current Some Day Smoker    Packs/day: 0.10    Types: Cigarettes  . Smokeless tobacco: Never Used     Comment: pt is attempting to quit. Smokes 2 a day.   . Alcohol use 0.0 oz/week    Family History  Problem Relation Age of Onset  . Cancer Mother   . Hypertension Mother   . Breast cancer Mother   . Hypertension Brother   . Colon cancer Neg Hx      Review of Systems  Musculoskeletal: Positive for joint pain.  All other systems reviewed and are negative.   Objective:  Physical Exam  Constitutional: She is oriented to person, place, and time. She appears well-developed and well-nourished.  HENT:  Head: Normocephalic and atraumatic.  Eyes: EOM are normal. Pupils are equal, round, and reactive to light.  Neck: Normal range of motion. Neck supple.  Cardiovascular: Normal rate and regular rhythm.   Respiratory: Effort normal and breath sounds normal.  GI: Soft. Bowel sounds are normal.  Musculoskeletal:       Right knee: She exhibits decreased range of motion, swelling, effusion and abnormal alignment. Tenderness found. Medial joint line and lateral joint line tenderness noted.  Neurological: She is alert and oriented to person, place, and time.  Skin: Skin is warm and dry.  Psychiatric:  She has a normal mood and affect.    Vital signs in last 24 hours:    Labs:   Estimated body mass index is 35.64 kg/m as calculated from the following:   Height as of 05/01/16: 5' 2.5" (1.588 m).   Weight as of 05/01/16: 198 lb (89.8 kg).   Imaging Review Plain radiographs demonstrate severe degenerative joint disease of the right knee(s). The overall alignment ismild varus. The bone quality appears to be excellent for age and reported activity level.  Assessment/Plan:  End  stage arthritis, right knee   The patient history, physical examination, clinical judgment of the provider and imaging studies are consistent with end stage degenerative joint disease of the right knee(s) and total knee arthroplasty is deemed medically necessary. The treatment options including medical management, injection therapy arthroscopy and arthroplasty were discussed at length. The risks and benefits of total knee arthroplasty were presented and reviewed. The risks due to aseptic loosening, infection, stiffness, patella tracking problems, thromboembolic complications and other imponderables were discussed. The patient acknowledged the explanation, agreed to proceed with the plan and consent was signed. Patient is being admitted for inpatient treatment for surgery, pain control, PT, OT, prophylactic antibiotics, VTE prophylaxis, progressive ambulation and ADL's and discharge planning. The patient is planning to be discharged home with home health services

## 2016-05-08 NOTE — Progress Notes (Signed)
X-ray results noted 

## 2016-05-08 NOTE — Transfer of Care (Signed)
Immediate Anesthesia Transfer of Care Note  Patient: Krystal Kane  Procedure(s) Performed: Procedure(s): RIGHT TOTAL KNEE ARTHROPLASTY (Right)  Patient Location: PACU  Anesthesia Type:Spinal  Level of Consciousness:  sedated, patient cooperative and responds to stimulation  Airway & Oxygen Therapy:Patient Spontanous Breathing and Patient connected to face mask oxgen  Post-op Assessment:  Report given to PACU RN and Post -op Vital signs reviewed and stable  Post vital signs:  Reviewed and stable  Last Vitals:  Vitals:   05/08/16 0941 05/08/16 1008  BP: (!) 163/95 (!) 145/91  Pulse: 88   Resp: 16   Temp: 123XX123 C     Complications: No apparent anesthesia complications

## 2016-05-08 NOTE — Brief Op Note (Signed)
05/08/2016  12:54 PM  PATIENT:  Joesphine Bare  55 y.o. female  PRE-OPERATIVE DIAGNOSIS:  osteoarthritis right knee  POST-OPERATIVE DIAGNOSIS:  osteoarthritis right knee  PROCEDURE:  Procedure(s): RIGHT TOTAL KNEE ARTHROPLASTY (Right)  SURGEON:  Surgeon(s) and Role:    * Mcarthur Rossetti, MD - Primary  PHYSICIAN ASSISTANT: Benita Stabile, PA-C  ANESTHESIA:   regional and spinal  EBL:  Total I/O In: 1000 [I.V.:1000] Out: 150 [Urine:50; Blood:100]   COUNTS:  YES  TOURNIQUET:   Total Tourniquet Time Documented: Thigh (Right) - 50 minutes Total: Thigh (Right) - 50 minutes   PLAN OF CARE: Admit to inpatient   PATIENT DISPOSITION:  PACU - hemodynamically stable.   Delay start of Pharmacological VTE agent (>24hrs) due to surgical blood loss or risk of bleeding: no

## 2016-05-08 NOTE — Anesthesia Procedure Notes (Addendum)
Anesthesia Regional Block:  Adductor canal block  Pre-Anesthetic Checklist: ,, timeout performed, Correct Patient, Correct Site, Correct Laterality, Correct Procedure, Correct Position, site marked, Risks and benefits discussed,  Surgical consent,  Pre-op evaluation,  At surgeon's request and post-op pain management  Laterality: Right  Prep: chloraprep       Needles:  Injection technique: Single-shot  Needle Type: Stimiplex     Needle Length: 9cm 9 cm Needle Gauge: 21 and 21 G    Additional Needles:  Procedures: ultrasound guided (picture in chart) Adductor canal block Narrative:  Start time: 05/08/2016 11:00 AM End time: 05/08/2016 11:10 AM Injection made incrementally with aspirations every 5 mL.  Performed by: Personally  Anesthesiologist: Nolon Nations  Additional Notes: BP cuff, EKG monitors applied. Sedation begun. Artery and nerve location verified with U/S and anesthetic injected incrementally, slowly, and after negative aspirations under direct u/s guidance. Good fascial /perineural spread. Tolerated well.

## 2016-05-08 NOTE — Anesthesia Procedure Notes (Signed)
Spinal  Patient location during procedure: OR Start time: 05/08/2016 11:17 AM End time: 05/08/2016 11:26 AM Reason for block: at surgeon's request Staffing Resident/CRNA: Anne Fu Performed: resident/CRNA  Preanesthetic Checklist Completed: patient identified, site marked, surgical consent, pre-op evaluation, timeout performed, IV checked, risks and benefits discussed and monitors and equipment checked Spinal Block Patient position: sitting Prep: Betadine Patient monitoring: heart rate, continuous pulse ox and blood pressure Approach: midline Location: L2-3 Injection technique: single-shot Needle Needle type: Pencan  Needle gauge: 24 G Needle length: 9 cm Assessment Sensory level: T6 Additional Notes Expiration date of kit checked and confirmed. Patient tolerated procedure well, without complications. X 1 attempt with noted clear CSF return. Loss of motor and sensory on exam post injection.

## 2016-05-09 LAB — BASIC METABOLIC PANEL
Anion gap: 7 (ref 5–15)
BUN: 13 mg/dL (ref 6–20)
CHLORIDE: 105 mmol/L (ref 101–111)
CO2: 26 mmol/L (ref 22–32)
Calcium: 8.6 mg/dL — ABNORMAL LOW (ref 8.9–10.3)
Creatinine, Ser: 0.69 mg/dL (ref 0.44–1.00)
GFR calc Af Amer: >60 mL/min (ref >60)
GLUCOSE: 142 mg/dL — AB (ref 65–99)
POTASSIUM: 3.7 mmol/L (ref 3.5–5.1)
Sodium: 138 mmol/L (ref 135–145)

## 2016-05-09 LAB — CBC
HCT: 32.4 % — ABNORMAL LOW (ref 36.0–46.0)
Hemoglobin: 10.8 g/dL — ABNORMAL LOW (ref 12.0–15.0)
MCH: 33.1 pg (ref 26.0–34.0)
MCHC: 33.3 g/dL (ref 30.0–36.0)
MCV: 99.4 fL (ref 78.0–100.0)
PLATELETS: 248 10*3/uL (ref 150–400)
RBC: 3.26 MIL/uL — AB (ref 3.87–5.11)
RDW: 12.8 % (ref 11.5–15.5)
WBC: 22 10*3/uL — ABNORMAL HIGH (ref 4.0–10.5)

## 2016-05-09 MED ORDER — RIVAROXABAN 10 MG PO TABS: 10.0000 mg | ORAL_TABLET | Freq: Every day | ORAL | 0 refills | Status: DC

## 2016-05-09 MED ORDER — METHOCARBAMOL 500 MG PO TABS: 500.0000 mg | ORAL_TABLET | Freq: Four times a day (QID) | ORAL | 0 refills | Status: DC | PRN

## 2016-05-09 MED ORDER — OXYCODONE-ACETAMINOPHEN 5-325 MG PO TABS: 1.0000 | ORAL_TABLET | ORAL | 0 refills | Status: DC | PRN

## 2016-05-09 NOTE — Op Note (Signed)
Krystal Kane, EDLEY NO.:  [id number]  MEDICAL RECORD NO.:  [id number]  LOCATION:  5                         FACILITY:  [facility]  PHYSICIAN:  Lind Guest. Ninfa Linden, M.D.DATE OF BIRTH:  [birth year]  DATE OF PROCEDURE:  05/08/2016 DATE OF DISCHARGE:                              OPERATIVE REPORT   PREOPERATIVE DIAGNOSES:  Primary osteoarthritis and degenerative joint disease, right knee.  POSTOPERATIVE DIAGNOSES:  Primary osteoarthritis and degenerative joint disease, right knee.  PROCEDURE:  Right total knee arthroplasty.  IMPLANTS:  Stryker Triathlon knee with size 3 femur, size 2 tibia, 9-mm fix-bearing polyethylene insert, size 29 patellar button.  SURGEON:  Lind Guest. Ninfa Linden, M.D.  ASSISTANT:  Erskine Emery, PA-C.  ANESTHESIA: 1. Regional right lower extremity block. 2. Spinal.  ANTIBIOTIC:  2 g of IV Ancef.  BLOOD LOSS:  Less than 200 mL.  TOURNIQUET TIME:  Less than 1 hour.  COMPLICATIONS:  None.  INDICATIONS:  Ms. Kleinpeter is a very pleasant 55 year old female, well known to me.  She has known primary osteoarthritis and degenerative joint disease of her right knee.  She has tried and failed all forms of conservative treatment.  Her pain is daily and has detrimentally affected her activities of daily living, her quality of life, and her mobility.  She is at the point where she does wish to proceed with a total knee arthroplasty.  She understands the risks of acute blood loss anemia, nerve and vessel injury, fracture, infection, and DVT.  She understands our goals are to decrease pain, improve mobility, and overall improve quality of life.  PROCEDURE DESCRIPTION:  After informed consent was obtained, appropriate right leg was marked.  Anesthesia obtained a regional anesthesia block with the adductor canal in the holding room.  She was then brought to the operating room and placed supine on the operating table.  She was then sat up and  spinal anesthesia was obtained.  She was then laid in a supine position.  A Foley catheter was placed.  A nonsterile tourniquet was placed around her upper right thigh.  Her right leg was then prepped and draped from the thigh down the ankle with DuraPrep and sterile drapes including sterile stockinette.  Time-out was called and she was identified as correct patient and correct right knee.  We then used Esmarch to wrap out the leg and tourniquet was inflated to 300 mmHg of pressure.  I then made a direct midline incision over the patella and carried this proximally and distally.  I dissected down to the knee joint and carried out a medial parapatellar arthrotomy.  We found a large joint effusion, significant periarticular osteophytes throughout the knee, as well as areas of full-thickness cartilage loss in all 3 compartments.  We then removed remnants of the ACL, PCL, medial and lateral meniscus.  With the knee in a flexed position, set our extramedullary cutting guide, based off the tibial tubercle and taking 9 mm off the high side correcting for varus and valgus in neutral slope. We made this cut without difficulty.  We then used an intramedullary guide with a drill hole through the notch area of the femur and  8 mm distal femoral cut set for right knee at 5 degrees externally rotated. We made this cut without difficulty as well and brought the knee back down to full extension.  We removed the remnant debris from the back of the knee, and then placed a 9-mm extension block, and we achieved full extension.  We went back to the femur and put our femoral sizing guide based off the epicondylar axis and for 3 degrees right and chose a size 3 femur.  We put a 4-in-1 cutting block for a size 3 femur, made our anterior and posterior cuts followed by our chamfer cuts.  We then made our femoral box cut.  We then went back to the tibia and set our tibial rotation off the tibial tubercle and the  femur and chose a size 2 tibial tray.  We made our keel punch for this.  With the size 2 tibial tray and the size 3 femur, we trialed a 9-mm polyethylene insert and we were pleased with our flexion and extension gaps, range of motion, and stability.  We then made our patellar cut and drilled 3 holes for a patellar button, size 29.  We then removed all trial components and irrigated the knee with normal saline solution using pulsatile lavage. We then mixed our cement and cemented the real Stryker Triathlon tibial tray size 2 followed by the real size 3 femur.  We placed our real 9-mm fix-bearing polyethylene insert and cemented our patellar button.  Once the cement had hardened, we removed cement debris from the knee and the tourniquet was let down and hemostasis was obtained with electrocautery. We then irrigated the knee again with normal saline solution and then closed the arthrotomy with interrupted #1 Vicryl suture followed by 0 Vicryl in the deep tissue, 2-0 Vicryl in the subcutaneous tissue, 4-0 Monocryl subcuticular stitch, and Steri-Strips on the skin.  Well-padded sterile dressing was applied.  She was taken off the operating table, taken to the recovery room in stable condition.  All final counts were correct.  There were no complications noted.  Of note, Erskine Emery, PA- C, assisted on the entire case.  Assistance was crucial from the beginning of the case to the end and he assisted throughout the entire case.     Lind Guest. Ninfa Linden, M.D.     CYB/MEDQ  D:  05/08/2016  T:  05/09/2016  Job:  [id number]

## 2016-05-09 NOTE — Evaluation (Signed)
Physical Therapy Evaluation Patient Details Name: Krystal Kane MRN: [id number] DOB: 1960-12-12 Today's Date: 05/09/2016   History of Present Illness  Pt s/p R TKA. PMH includes OA, cancer, depression, HTN.   Clinical Impression  Pt is s/p TKA resulting in the deficits listed below (see PT Problem List).  Pt will benefit from skilled PT to increase their independence and safety with mobility to allow discharge to the venue listed below.      Follow Up Recommendations Home health PT    Equipment Recommendations  Rolling walker with 5" wheels    Recommendations for Other Services       Precautions / Restrictions Precautions Precautions: Fall;Knee Precaution Booklet Issued: No Required Braces or Orthoses: Knee Immobilizer - Right Restrictions Weight Bearing Restrictions: No RLE Weight Bearing: Weight bearing as tolerated Other Position/Activity Restrictions: WBAT      Mobility  Bed Mobility Overal bed mobility: Needs Assistance Bed Mobility: Supine to Sit     Supine to sit: Min guard     General bed mobility comments: for safety, min/guard RLE, guided to seated position  Transfers Overall transfer level: Needs assistance Equipment used: Rolling walker (2 wheeled) Transfers: Sit to/from Stand Sit to Stand: Min guard         General transfer comment: assist to rise and stabilize, cues for hand placement  Ambulation/Gait Ambulation/Gait assistance: Min guard Ambulation Distance (Feet): 120 Feet Assistive device: Rolling walker (2 wheeled) Gait Pattern/deviations: Step-to pattern;Antalgic     General Gait Details: cues for RW position and sequence  Stairs            Wheelchair Mobility    Modified Rankin (Stroke Patients Only)       Balance                                             Pertinent Vitals/Pain Pain Assessment: 0-10 Pain Score: 1  (1 at beginning of session but increased witih movement) Pain Location: RLE Pain  Descriptors / Indicators: Aching;Sore;Other (Comment) (stiffness) Pain Intervention(s): Monitored during session;Ice applied    Home Living Family/patient expects to be discharged to:: Private residence Living Arrangements: Spouse/significant other Available Help at Discharge: Family;Available PRN/intermittently Type of Home: House Home Access: Stairs to enter Entrance Stairs-Rails: Psychiatric nurse of Steps: 5-6 Home Layout: One level Home Equipment: None      Prior Function Level of Independence: Independent               Hand Dominance        Extremity/Trunk Assessment   Upper Extremity Assessment: Overall WFL for tasks assessed           Lower Extremity Assessment: Defer to PT evaluation RLE Deficits / Details: ankle WFL; knee extension and hip flexion ~3/5, anticipated post op weakness       Communication   Communication: No difficulties  Cognition Arousal/Alertness: Awake/alert Behavior During Therapy: WFL for tasks assessed/performed Overall Cognitive Status: Within Functional Limits for tasks assessed                      General Comments      Exercises Total Joint Exercises Ankle Circles/Pumps: AROM;Both;10 reps Quad Sets: Both;10 reps;AROM   Assessment/Plan    PT Assessment Patient needs continued PT services  PT Problem List Decreased strength;Decreased range of motion;Decreased activity tolerance;Decreased mobility;Decreased knowledge of use of  DME;Decreased safety awareness          PT Treatment Interventions DME instruction;Gait training;Functional mobility training;Therapeutic activities;Therapeutic exercise;Patient/family education    PT Goals (Current goals can be found in the Care Plan section)  Acute Rehab PT Goals Patient Stated Goal: not stated PT Goal Formulation: With patient Time For Goal Achievement: 05/12/16 Potential to Achieve Goals: Good    Frequency 7X/week   Barriers to discharge         Co-evaluation               End of Session Equipment Utilized During Treatment: Gait belt Activity Tolerance: Patient tolerated treatment well Patient left: in chair;with call bell/phone within reach;with chair alarm set           Time: [id number] PT Time Calculation (min) (ACUTE ONLY): 21 min   Charges:   PT Evaluation $PT Eval Low Complexity: 1 Procedure     PT G Codes:        Granite Godman 05-12-16, 1:26 PM

## 2016-05-09 NOTE — Care Management Note (Signed)
Case Management Note  Patient Details  Name: Krystal Kane MRN: [id number] Date of Birth: 11/09/1960  Subjective/Objective:  Right TKA                  Action/Plan: Discharge Planning: NCM spoke to pt. Offered choice for [facility]. Preoperatively arranged with [facility] for [facility]. Pt agreeable to [facility] for [facility]. Requested RW for home. Contacted [facility] DME rep with equipment order. Will deliver to room prior to dc.    Expected Discharge Date:  05/10/2016             Expected Discharge Plan:  [place]  In-House Referral:  NA  Discharge planning Services  CM Consult  Post Acute Care Choice:  Home Health Choice offered to:  Patient  DME Arranged:  Walker rolling DME Agency:  [place]:  PT [place] Agency:  [facility] ([facility])  Status of Service:  Completed, signed off  If discussed at [employer] of Stay Meetings, dates discussed:    Additional Comments:  Erenest Rasher, RN 05/09/2016, 10:28 AM

## 2016-05-09 NOTE — Discharge Instructions (Signed)
INSTRUCTIONS AFTER JOINT REPLACEMENT  ° °o Remove items at home which could result in a fall. This includes throw rugs or furniture in walking pathways °o ICE to the affected joint every three hours while awake for 30 minutes at a time, for at least the first 3-5 days, and then as needed for pain and swelling.  Continue to use ice for pain and swelling. You may notice swelling that will progress down to the foot and ankle.  This is normal after surgery.  Elevate your leg when you are not up walking on it.   °o Continue to use the breathing machine you got in the hospital (incentive spirometer) which will help keep your temperature down.  It is common for your temperature to cycle up and down following surgery, especially at night when you are not up moving around and exerting yourself.  The breathing machine keeps your lungs expanded and your temperature down. ° ° °DIET:  As you were doing prior to hospitalization, we recommend a well-balanced diet. ° °DRESSING / WOUND CARE / SHOWERING ° °Keep the surgical dressing until follow up.  The dressing is water proof, so you can shower without any extra covering.  IF THE DRESSING FALLS OFF or the wound gets wet inside, change the dressing with sterile gauze.  Please use good hand washing techniques before changing the dressing.  Do not use any lotions or creams on the incision until instructed by your surgeon.   ° °ACTIVITY ° °o Increase activity slowly as tolerated, but follow the weight bearing instructions below.   °o No driving for 6 weeks or until further direction given by your physician.  You cannot drive while taking narcotics.  °o No lifting or carrying greater than 10 lbs. until further directed by your surgeon. °o Avoid periods of inactivity such as sitting longer than an hour when not asleep. This helps prevent blood clots.  °o You may return to work once you are authorized by your doctor.  ° ° ° °WEIGHT BEARING  ° °Weight bearing as tolerated with assist  device (walker, cane, etc) as directed, use it as long as suggested by your surgeon or therapist, typically at least 4-6 weeks. ° ° °EXERCISES ° °Results after joint replacement surgery are often greatly improved when you follow the exercise, range of motion and muscle strengthening exercises prescribed by your doctor. Safety measures are also important to protect the joint from further injury. Any time any of these exercises cause you to have increased pain or swelling, decrease what you are doing until you are comfortable again and then slowly increase them. If you have problems or questions, call your caregiver or physical therapist for advice.  ° °Rehabilitation is important following a joint replacement. After just a few days of immobilization, the muscles of the leg can become weakened and shrink (atrophy).  These exercises are designed to build up the tone and strength of the thigh and leg muscles and to improve motion. Often times heat used for twenty to thirty minutes before working out will loosen up your tissues and help with improving the range of motion but do not use heat for the first two weeks following surgery (sometimes heat can increase post-operative swelling).  ° °These exercises can be done on a training (exercise) mat, on the floor, on a table or on a bed. Use whatever works the best and is most comfortable for you.    Use music or television while you are exercising so that   the exercises are a pleasant break in your day. This will make your life better with the exercises acting as a break in your routine that you can look forward to.   Perform all exercises about fifteen times, three times per day or as directed.  You should exercise both the operative leg and the other leg as well. ° °Exercises include: °  °• Quad Sets - Tighten up the muscle on the front of the thigh (Quad) and hold for 5-10 seconds.   °• Straight Leg Raises - With your knee straight (if you were given a brace, keep it on),  lift the leg to 60 degrees, hold for 3 seconds, and slowly lower the leg.  Perform this exercise against resistance later as your leg gets stronger.  °• Leg Slides: Lying on your back, slowly slide your foot toward your buttocks, bending your knee up off the floor (only go as far as is comfortable). Then slowly slide your foot back down until your leg is flat on the floor again.  °• Angel Wings: Lying on your back spread your legs to the side as far apart as you can without causing discomfort.  °• Hamstring Strength:  Lying on your back, push your heel against the floor with your leg straight by tightening up the muscles of your buttocks.  Repeat, but this time bend your knee to a comfortable angle, and push your heel against the floor.  You may put a pillow under the heel to make it more comfortable if necessary.  ° °A rehabilitation program following joint replacement surgery can speed recovery and prevent re-injury in the future due to weakened muscles. Contact your doctor or a physical therapist for more information on knee rehabilitation.  ° ° °CONSTIPATION ° °Constipation is defined medically as fewer than three stools per week and severe constipation as less than one stool per week.  Even if you have a regular bowel pattern at home, your normal regimen is likely to be disrupted due to multiple reasons following surgery.  Combination of anesthesia, postoperative narcotics, change in appetite and fluid intake all can affect your bowels.  ° °YOU MUST use at least one of the following options; they are listed in order of increasing strength to get the job done.  They are all available over the counter, and you may need to use some, POSSIBLY even all of these options:   ° °Drink plenty of fluids (prune juice may be helpful) and high fiber foods °Colace 100 mg by mouth twice a day  °Senokot for constipation as directed and as needed Dulcolax (bisacodyl), take with full glass of water  °Miralax (polyethylene glycol)  once or twice a day as needed. ° °If you have tried all these things and are unable to have a bowel movement in the first 3-4 days after surgery call either your surgeon or your primary doctor.   ° °If you experience loose stools or diarrhea, hold the medications until you stool forms back up.  If your symptoms do not get better within 1 week or if they get worse, check with your doctor.  If you experience "the worst abdominal pain ever" or develop nausea or vomiting, please contact the office immediately for further recommendations for treatment. ° ° °ITCHING:  If you experience itching with your medications, try taking only a single pain pill, or even half a pain pill at a time.  You can also use Benadryl over the counter for itching or also to   help with sleep.  ° °TED HOSE STOCKINGS:  Use stockings on both legs until for at least 2 weeks or as directed by physician office. They may be removed at night for sleeping. ° °MEDICATIONS:  See your medication summary on the “After Visit Summary” that nursing will review with you.  You may have some home medications which will be placed on hold until you complete the course of blood thinner medication.  It is important for you to complete the blood thinner medication as prescribed. ° °PRECAUTIONS:  If you experience chest pain or shortness of breath - call 911 immediately for transfer to the hospital emergency department.  ° °If you develop a fever greater that 101 F, purulent drainage from wound, increased redness or drainage from wound, foul odor from the wound/dressing, or calf pain - CONTACT YOUR SURGEON.   °                                                °FOLLOW-UP APPOINTMENTS:  If you do not already have a post-op appointment, please call the office for an appointment to be seen by your surgeon.  Guidelines for how soon to be seen are listed in your “After Visit Summary”, but are typically between 1-4 weeks after surgery. ° °OTHER INSTRUCTIONS:  ° °Knee  Replacement:  Do not place pillow under knee, focus on keeping the knee straight while resting. CPM instructions: 0-90 degrees, 2 hours in the morning, 2 hours in the afternoon, and 2 hours in the evening. Place foam block, curve side up under heel at all times except when in CPM or when walking.  DO NOT modify, tear, cut, or change the foam block in any way. ° °MAKE SURE YOU:  °• Understand these instructions.  °• Get help right away if you are not doing well or get worse.  ° ° °Thank you for letting us be a part of your medical care team.  It is a privilege we respect greatly.  We hope these instructions will help you stay on track for a fast and full recovery!  ° °Information on my medicine - XARELTO® (Rivaroxaban) ° °This medication education was reviewed with me or my healthcare representative as part of my discharge preparation.  The pharmacist that spoke with me during my hospital stay was:  Shravan Salahuddin Michelle, RPH ° °Why was Xarelto® prescribed for you? °Xarelto® was prescribed for you to reduce the risk of blood clots forming after orthopedic surgery. The medical term for these abnormal blood clots is venous thromboembolism (VTE). ° °What do you need to know about xarelto® ? °Take your Xarelto® ONCE DAILY at the same time every day. °You may take it either with or without food. ° °If you have difficulty swallowing the tablet whole, you may crush it and mix in applesauce just prior to taking your dose. ° °Take Xarelto® exactly as prescribed by your doctor and DO NOT stop taking Xarelto® without talking to the doctor who prescribed the medication.  Stopping without other VTE prevention medication to take the place of Xarelto® may increase your risk of developing a clot. ° °After discharge, you should have regular check-up appointments with your healthcare provider that is prescribing your Xarelto®.   ° °What do you do if you miss a dose? °If you miss a dose, take it as soon as you   remember on the same  day then continue your regularly scheduled once daily regimen the next day. Do not take two doses of Xarelto® on the same day.  ° °Important Safety Information °A possible side effect of Xarelto® is bleeding. You should call your healthcare provider right away if you experience any of the following: °? Bleeding from an injury or your nose that does not stop. °? Unusual colored urine (red or dark brown) or unusual colored stools (red or black). °? Unusual bruising for unknown reasons. °? A serious fall or if you hit your head (even if there is no bleeding). ° °Some medicines may interact with Xarelto® and might increase your risk of bleeding while on Xarelto®. To help avoid this, consult your healthcare provider or pharmacist prior to using any new prescription or non-prescription medications, including herbals, vitamins, non-steroidal anti-inflammatory drugs (NSAIDs) and supplements. ° °This website has more information on Xarelto®: www.xarelto.com. ° ° °

## 2016-05-09 NOTE — Progress Notes (Signed)
  PT TX NOTE 05/09/16 1500  PT Visit Information  Last PT Received On 05/09/16  Assistance Needed +1  History of Present Illness Pt s/p R TKA. PMH includes OA, cancer, depression, HTN.   Subjective Data  Patient Stated Goal not stated  Precautions  Precautions Fall;Knee  Precaution Booklet Issued No  Precaution Comments pt able to do IND SLRs  Required Braces or Orthoses Knee Immobilizer - Right  Knee Immobilizer - Right Discontinue once straight leg raise with < 10 degree lag  Restrictions  Other Position/Activity Restrictions WBAT  Pain Assessment  Pain Assessment 0-10  Pain Score 3  Pain Location R knee  Pain Descriptors / Indicators Sore  Pain Intervention(s) Limited activity within patient's tolerance;Monitored during session;Ice applied  Cognition  Arousal/Alertness Awake/alert  Behavior During Therapy WFL for tasks assessed/performed  Overall Cognitive Status Within Functional Limits for tasks assessed  Bed Mobility  Overal bed mobility Needs Assistance  Bed Mobility Supine to Sit;Sit to Supine  Supine to sit Min guard  Sit to supine Min guard  General bed mobility comments for safety, min/guard RLE, guided to seated position and back onto bed  Transfers  Overall transfer level Needs assistance  Equipment used Rolling walker (2 wheeled)  Transfers Sit to/from Stand  Sit to Stand Min guard  General transfer comment min/guard to rise and stabilize, cues for hand placement  Ambulation/Gait  Ambulation/Gait assistance Min guard  Ambulation Distance (Feet) 160 Feet  Assistive device Rolling walker (2 wheeled)  Gait Pattern/deviations Step-to pattern;Step-through pattern  General Gait Details cues for RW position and sequence, gait progression  Total Joint Exercises  Ankle Circles/Pumps AROM;Both;10 reps  Quad Sets Both;10 reps;AROM  Short Arc Quad AROM;Strengthening;Right;10 reps  Heel Slides AAROM;Right;15 reps  Hip ABduction/ADduction AROM;Strengthening;10 reps   Straight Leg Raises AROM;Strengthening;Right;10 reps  PT - End of Session  Equipment Utilized During Treatment Gait belt  Activity Tolerance Patient tolerated treatment well  Patient left in bed;with call bell/phone within reach;with bed alarm set  PT - Assessment/Plan  PT Plan Current plan remains appropriate  PT Frequency (ACUTE ONLY) 7X/week  Follow Up Recommendations Home health PT  PT equipment Rolling walker with 5" wheels  PT Goal Progression  Progress towards PT goals Progressing toward goals  Acute Rehab PT Goals  PT Goal Formulation With patient  Time For Goal Achievement 05/12/16  Potential to Achieve Goals Good  PT Time Calculation  PT Start Time (ACUTE ONLY) 1441  PT Stop Time (ACUTE ONLY) 1507  PT Time Calculation (min) (ACUTE ONLY) 26 min  PT General Charges  $$ ACUTE PT VISIT 1 Procedure  PT Treatments  $Gait Training 8-22 mins  $Therapeutic Exercise 8-22 mins

## 2016-05-09 NOTE — Evaluation (Addendum)
Occupational Therapy Evaluation Patient Details Name: Krystal Kane MRN: [id number] DOB: [birth year] Today's Date: 05/09/2016    History of Present Illness Pt s/p R TKA. PMH includes OA, cancer, depression, HTN.    Clinical Impression   Pt s/p above. Pt independent with ADLs, PTA. Feel pt will benefit from acute OT to increase independence and safety prior to d/c.   Follow Up Recommendations  No OT follow up;Supervision/Assistance - 24 hour    Equipment Recommendations  Other (comment) (RW with two wheels)    Recommendations for Other Services       Precautions / Restrictions Precautions Precautions: Fall;Knee Precaution Booklet Issued: No  Restrictions Weight Bearing Restrictions: Yes RLE Weight Bearing: Weight bearing as tolerated Other Position/Activity Restrictions: WBAT      Mobility Bed Mobility Overal bed mobility: Modified Independent Bed Mobility: Sit to Supine     Supine to sit: Modified independent (Device/Increase time)        Transfers Overall transfer level: Needs assistance Equipment used: Rolling walker (2 wheeled) Transfers: Sit to/from Stand Sit to Stand: Supervision              Balance  Used RW for ambulation.                                          ADL Overall ADL's : Needs assistance/impaired     Grooming: Standing;Supervision/safety;Set up           Upper Body Dressing : Supervision/safety;Sitting;Standing   Lower Body Dressing: Minimal assistance;Sit to/from stand   Toilet Transfer: Min guard;Ambulation;RW;Regular Museum/gallery exhibitions officer and Hygiene: Min guard;Sit to/from stand       Functional mobility during ADLs: Min guard;Rolling walker General ADL Comments: Educated on LB dressing technique. Mentioned AE but pt not interested. Educated on Stage manager. Discussed tub transfer technique..     Vision     Perception     Praxis      Pertinent Vitals/Pain Pain  Assessment: 0-10 Pain Score: 1  (1 at beginning of session but increased witih movement) Pain Location: RLE Pain Descriptors / Indicators: Aching;Sore;Other (Comment) (stiffness) Pain Intervention(s): Monitored during session;Ice applied     Hand Dominance     Extremity/Trunk Assessment Upper Extremity Assessment Upper Extremity Assessment: Overall WFL for tasks assessed   Lower Extremity Assessment Lower Extremity Assessment: Defer to PT evaluation       Communication Communication Communication: No difficulties   Cognition Arousal/Alertness: Awake/alert Behavior During Therapy: WFL for tasks assessed/performed Overall Cognitive Status: Within Functional Limits for tasks assessed                     General Comments       Exercises       Shoulder Instructions      Home Living Family/patient expects to be discharged to:: Private residence Living Arrangements: Spouse/significant other Available Help at Discharge: Family;Available PRN/intermittently Type of Home: House Home Access: Stairs to enter [employer] of Steps: 5-6 Entrance Stairs-Rails: Right;Left Home Layout: One level     Bathroom Shower/Tub: Teacher, early years/pre: Standard (tub close)     Home Equipment: None          Prior Functioning/Environment Level of Independence: Independent                 OT Problem List: Decreased strength;Decreased range of  motion;Decreased activity tolerance;Decreased knowledge of use of DME or AE;Decreased knowledge of precautions;Pain   OT Treatment/Interventions: Self-care/ADL training;DME and/or AE instruction;Patient/family education;Balance training;Therapeutic activities    OT Goals(Current goals can be found in the care plan section) Acute Rehab OT Goals Patient Stated Goal: not stated OT Goal Formulation: With patient Time For Goal Achievement: 05/16/16 Potential to Achieve Goals: Good ADL Goals Pt Will Perform  Lower Body Dressing: sit to/from stand;with modified independence (including gathering items) Pt Will Transfer to Toilet: ambulating;with modified independence;regular height toilet (using RW) Pt Will Perform Tub/Shower Transfer: Tub transfer;with supervision;with set-up;ambulating;shower seat;rolling walker  OT Frequency: Min 2X/week   Barriers to D/C:            Co-evaluation              End of Session Equipment Utilized During Treatment: Gait belt;Rolling walker CPM Right Knee CPM Right Knee: Off  Activity Tolerance: Patient tolerated treatment well Patient left: in bed;with call bell/phone within reach;with bed alarm set   Time: 1030-1046 OT Time Calculation (min): 16 min Charges:  OT General Charges $OT Visit: 1 Procedure OT Evaluation $OT Eval Moderate Complexity: 1 Procedure G-Codes:    Dawnelle Warman L OTR/L 05/09/2016, 12:15 PM

## 2016-05-09 NOTE — Progress Notes (Signed)
Subjective: 1 Day Post-Op Procedure(s) (LRB): RIGHT TOTAL KNEE ARTHROPLASTY (Right) Patient reports pain as moderate.    Objective: Vital signs in last 24 hours: Temp:  [98 F (36.7 C)-98.6 F (37 C)] 98.6 F (37 C) (11/18 0901) Pulse Rate:  [66-88] 88 (11/18 0901) Resp:  [12-19] 16 (11/18 0901) BP: (107-157)/(59-97) 140/71 (11/18 0901) SpO2:  [93 %-100 %] 97 % (11/18 0901) Weight:  [198 lb (89.8 kg)] 198 lb (89.8 kg) (11/17 1458)  Intake/Output from previous day: 11/17 0701 - 11/18 0700 In: 5687.5 [P.O.:2525; I.V.:2997.5; IV Piggyback:165] Out: 2050 [Urine:1950; Blood:100] Intake/Output this shift: Total I/O In: 240 [P.O.:240] Out: 250 [Urine:250]   Recent Labs  05/09/16 0357  HGB 10.8*    Recent Labs  05/09/16 0357  WBC 22.0*  RBC 3.26*  HCT 32.4*  PLT 248    Recent Labs  05/09/16 0357  NA 138  K 3.7  CL 105  CO2 26  BUN 13  CREATININE 0.69  GLUCOSE 142*  CALCIUM 8.6*   No results for input(s): LABPT, INR in the last 72 hours.  Sensation intact distally Intact pulses distally Dorsiflexion/Plantar flexion intact Incision: no drainage No cellulitis present Compartment soft  Assessment/Plan: 1 Day Post-Op Procedure(s) (LRB): RIGHT TOTAL KNEE ARTHROPLASTY (Right) Up with therapy Plan for discharge tomorrow Discharge home with home health  Mcarthur Rossetti 05/09/2016, 11:48 AM

## 2016-05-10 NOTE — Progress Notes (Signed)
Subjective: Patient stable.  Pain control.  She is not mobilizing quite as well as she anticipated.   Objective: Vital signs in last 24 hours: Temp:  [98.2 F (36.8 C)-101.5 F (38.6 C)] 100.7 F (38.2 C) (11/19 0700) Pulse Rate:  [66-103] 103 (11/19 0517) Resp:  [18-20] 20 (11/19 0517) BP: (103-141)/(65-90) 141/90 (11/19 0517) SpO2:  [91 %-93 %] 91 % (11/19 0517)  Intake/Output from previous day: 11/18 0701 - 11/19 0700 In: 480 [P.O.:480] Out: 1350 [Urine:1350] Intake/Output this shift: Total I/O In: 300 [P.O.:300] Out: -   Exam:  Dorsiflexion/Plantar flexion intact on the right  Labs:  Recent Labs  05/09/16 0357  HGB 10.8*    Recent Labs  05/09/16 0357  WBC 22.0*  RBC 3.26*  HCT 32.4*  PLT 248    Recent Labs  05/09/16 0357  NA 138  K 3.7  CL 105  CO2 26  BUN 13  CREATININE 0.69  GLUCOSE 142*  CALCIUM 8.6*   No results for input(s): LABPT, INR in the last 72 hours.  Assessment/Plan: Plan at this time is to continue to work with physical therapy.  She has not done the stairs yet and thus we'll be unable to be discharged today.  Anticipate that she will be able to potentially be discharged tomorrow.   G Scott Augustine Leverette 05/10/2016, 10:12 AM

## 2016-05-10 NOTE — Progress Notes (Signed)
Occupational Therapy Treatment Patient Details Name: CESIAH KETTELL MRN: [id number] DOB: Dec 15, 1960 Today's Date: 05/10/2016    History of present illness (P) Pt s/p R TKA. PMH includes OA, cancer, depression, HTN.                Precautions / Restrictions Precautions Precautions: (P) Fall;Knee Precaution Booklet Issued: No Precaution Comments: (P) pt able to do IND SLRs--KI D/C'd Required Braces or Orthoses: (P) Knee Immobilizer - Right Knee Immobilizer - Right: (P) Discontinue once straight leg raise with < 10 degree lag Restrictions Weight Bearing Restrictions: (P) No RLE Weight Bearing: (P) Weight bearing as tolerated Other Position/Activity Restrictions: WBAT       Mobility Bed Mobility Overal bed mobility: (P) Needs Assistance Bed Mobility: (P) Supine to Sit;Sit to Supine     Supine to sit: (P) Min guard Sit to supine: (P) Min assist   General bed mobility comments: (P)  min/guard RLE, guided to seated position and assist to bring RLE onto bed; pt instructed in use of LOOP to self assist   Transfers Overall transfer level: (P) Needs assistance Equipment used: (P) Rolling walker (2 wheeled) Transfers: (P) Sit to/from Stand Sit to Stand: (P) Min guard Stand pivot transfers: Min guard       General transfer comment: (P) min/guard to rise and stabilize, cues for hand placement        ADL       Grooming: Standing;Supervision/safety;Set up           Upper Body Dressing : Supervision/safety;Sitting;Standing   Lower Body Dressing: Minimal assistance;Cueing for safety;Cueing for sequencing   Toilet Transfer: Supervision/safety;Cueing for sequencing;Cueing for safety;Ambulation;RW;Regular Toilet   Toileting- Water quality scientist and Hygiene: Supervision/safety;Sit to/from stand;Cueing for safety;Cueing for sequencing       Functional mobility during ADLs: Supervision/safety;Cueing for safety;Cueing for sequencing General ADL Comments: Pts girls will A  as needed.  Discussed tub transfer technique.           Perception     Praxis      Cognition   Behavior During Therapy: (P) WFL for tasks assessed/performed Overall Cognitive Status: (P) Within Functional Limits for tasks assessed                               General Comments      Pertinent Vitals/ Pain       Pain Score: (P) 7  Pain Location: (P) R knee Pain Descriptors / Indicators: (P) Constant;Operative site guarding;Grimacing;Sore Pain Intervention(s): (P) Limited activity within patient's tolerance;Monitored during session;Premedicated before session;Repositioned;Ice applied     Prior Functioning/Environment              Frequency           Progress Toward Goals  OT Goals(current goals can now be found in the care plan section)        Plan         End of Session CPM Right Knee CPM Right Knee: Off   Activity Tolerance Patient tolerated treatment well   Patient Left with call bell/phone within reach;in bed   Nurse Communication Mobility status        Time: [id number] OT Time Calculation (min): 20 min  Charges: OT General Charges $OT Visit: 1 Procedure OT Treatments $Self Care/Home Management : 8-22 mins  Agustina Witzke, Thereasa Parkin 05/10/2016, 11:50 AM

## 2016-05-10 NOTE — Progress Notes (Addendum)
   05/10/16 1300  PT Visit Information  Last PT Received On 05/10/16  R LE (thigh to ankle) with incr edema today, pt painful but motivated to work within limits of pain  Assistance Needed +1  History of Present Illness Pt s/p R TKA. PMH includes OA, cancer, depression, HTN.   Precautions  Precautions Fall;Knee  Precaution Comments pt able to do IND SLRs--KI D/C'd  Required Braces or Orthoses Knee Immobilizer - Right  Knee Immobilizer - Right Discontinue once straight leg raise with < 10 degree lag  Restrictions  Weight Bearing Restrictions No  RLE Weight Bearing WBAT  Pain Assessment  Pain Assessment 0-10  Pain Score 5  Pain Location R knee  Pain Descriptors / Indicators Sore  Pain Intervention(s) Limited activity within patient's tolerance;Monitored during session;Repositioned  Cognition  Arousal/Alertness Awake/alert  Behavior During Therapy WFL for tasks assessed/performed  Overall Cognitive Status Within Functional Limits for tasks assessed  Bed Mobility  Overal bed mobility Needs Assistance  Bed Mobility Supine to Sit  Supine to sit Min guard  General bed mobility comments min/guard RLE, guided to seated position and assist to bring RLE onto bed;   Transfers  Overall transfer level Needs assistance  Equipment used Rolling walker (2 wheeled)  Transfers Sit to/from Stand  Sit to Stand Min guard  General transfer comment cues for hand placement   Ambulation/Gait  Ambulation/Gait assistance Min guard;Supervision  Ambulation Distance (Feet) 75 Feet  Assistive device Rolling walker (2 wheeled)  Gait Pattern/deviations Step-to pattern;Step-through pattern  General Gait Details cues for RW position and sequence, gait progression; increased pain last 12' of distance  Total Joint Exercises  Ankle Circles/Pumps AROM;Both;10 reps  Quad Sets Both;10 reps;AROM  Short Arc Quad AROM;Strengthening;Right;10 reps  Heel Slides AAROM;Right;15 reps;Limitations  Hip ABduction/ADduction  AROM;Strengthening;10 reps  Straight Leg Raises AROM;Strengthening;Right;10 reps  Knee Flexion AAROM;Right;5 reps;Seated  Heel Slides Limitations flexion quite limited, rest betwen sets of 5  PT - End of Session  Activity Tolerance Patient tolerated treatment well  Patient left in chair;with call bell/phone within reach;with nursing/sitter in room  PT - Assessment/Plan  PT Plan Current plan remains appropriate  PT Frequency (ACUTE ONLY) 7X/week  Follow Up Recommendations Home health PT  PT equipment Rolling walker with 5" wheels  PT Goal Progression  Progress towards PT goals Progressing toward goals  Acute Rehab PT Goals  PT Goal Formulation With patient  Time For Goal Achievement 05/12/16  Potential to Achieve Goals Good  PT Time Calculation  PT Start Time (ACUTE ONLY) 1224  PT Stop Time (ACUTE ONLY) 1247  PT Time Calculation (min) (ACUTE ONLY) 23 min  PT General Charges  $$ ACUTE PT VISIT 1 Procedure  PT Treatments  $Gait Training 8-22 mins  $Therapeutic Exercise 8-22 mins

## 2016-05-10 NOTE — Progress Notes (Signed)
Physical Therapy Treatment Patient Details Name: Krystal Kane MRN: [id number] DOB: Apr 01, 1961 Today's Date: 05/10/2016    History of Present Illness Pt s/p R TKA. PMH includes OA, cancer, depression, HTN.     PT Comments    Progressing but with incr pain despite having had meds; will see again later today  Follow Up Recommendations  Home health PT     Equipment Recommendations  Rolling walker with 5" wheels    Recommendations for Other Services       Precautions / Restrictions Precautions Precautions: Fall;Knee Precaution Booklet Issued: No Precaution Comments: pt able to do IND SLRs--KI D/C'd Required Braces or Orthoses: Knee Immobilizer - Right Knee Immobilizer - Right: Discontinue once straight leg raise with < 10 degree lag Restrictions Weight Bearing Restrictions: No RLE Weight Bearing: Weight bearing as tolerated Other Position/Activity Restrictions: WBAT    Mobility  Bed Mobility Overal bed mobility: Needs Assistance Bed Mobility: Supine to Sit;Sit to Supine     Supine to sit: Min guard Sit to supine: Min assist   General bed mobility comments:  min/guard RLE, guided to seated position and assist to bring RLE onto bed; pt instructed in use of LOOP to self assist   Transfers Overall transfer level: Needs assistance Equipment used: Rolling walker (2 wheeled) Transfers: Sit to/from Stand Sit to Stand: Min guard Stand pivot transfers: Min guard       General transfer comment: min/guard to rise and stabilize, cues for hand placement  Ambulation/Gait Ambulation/Gait assistance: Min guard Ambulation Distance (Feet): 60 Feet Assistive device: Rolling walker (2 wheeled) Gait Pattern/deviations: Step-to pattern;Antalgic     General Gait Details: cues for RW position and sequence, gait progression   Stairs Stairs: Yes Stairs assistance: Min assist Stair Management: Two rails;Forwards;Step to pattern Number of Stairs: 3 General stair comments: cues  for sequence, technique  Wheelchair Mobility    Modified Rankin (Stroke Patients Only)       Balance                                    Cognition Arousal/Alertness: Awake/alert Behavior During Therapy: WFL for tasks assessed/performed Overall Cognitive Status: Within Functional Limits for tasks assessed                      Exercises Total Joint Exercises Ankle Circles/Pumps:  (pt in too much pain at this time for ther ex)    General Comments        Pertinent Vitals/Pain Pain Score: 7  Pain Location: R knee Pain Descriptors / Indicators: Constant;Operative site guarding;Grimacing;Sore Pain Intervention(s): Limited activity within patient's tolerance;Monitored during session;Premedicated before session;Repositioned;Ice applied    Home Living                      Prior Function            PT Goals (current goals can now be found in the care plan section) Acute Rehab PT Goals PT Goal Formulation: With patient Time For Goal Achievement: 05/12/16 Potential to Achieve Goals: Good Progress towards PT goals: Progressing toward goals    Frequency    7X/week      PT Plan Current plan remains appropriate    Co-evaluation             End of Session Equipment Utilized During Treatment: Gait belt Activity Tolerance: Patient tolerated treatment well Patient left: in  bed;with call bell/phone within reach;with bed alarm set     Time: [id number] PT Time Calculation (min) (ACUTE ONLY): 18 min  Charges:  $Gait Training: 8-22 mins                    G Codes:      Shann Lewellyn 2016-05-14, 11:59 AM

## 2016-05-11 ENCOUNTER — Ambulatory Visit: Payer: BLUE CROSS/BLUE SHIELD | Admitting: Internal Medicine

## 2016-05-11 ENCOUNTER — Encounter (HOSPITAL_COMMUNITY): Payer: Self-pay | Admitting: Orthopaedic Surgery

## 2016-05-11 NOTE — Discharge Summary (Signed)
Patient ID: Krystal Kane MRN: [id number] DOB/AGE: 54-Dec-1962 54 y.o.  Admit date: 05/08/2016 Discharge date: 05/11/2016  Admission Diagnoses:  Principal Problem:   Unilateral primary osteoarthritis, right knee Active Problems:   Status post total right knee replacement   Discharge Diagnoses:  Same  Past Medical History:  Diagnosis Date  . Allergy    seasonal  . Arthritis    osteoarthritis  . Cancer ([place])    ductal ca in situ rt breast  . Depression   . Family history of adverse reaction to anesthesia    Father died from reaction to Anesthesia 1971  . Fluid retention   . History of kidney stones   . Hypertension     Surgeries: Procedure(s): RIGHT TOTAL KNEE ARTHROPLASTY on 05/08/2016   Consultants:   Discharged Condition: Improved  Hospital Course: Krystal Kane is an 55 y.o. female who was admitted 05/08/2016 for operative treatment ofUnilateral primary osteoarthritis, right knee. Patient has severe unremitting pain that affects sleep, daily activities, and work/hobbies. After pre-op clearance the patient was taken to the operating room on 05/08/2016 and underwent  Procedure(s): RIGHT TOTAL KNEE ARTHROPLASTY.    Patient was given perioperative antibiotics: Anti-infectives    Start     Dose/Rate Route Frequency Ordered Stop   05/08/16 1800  ceFAZolin (ANCEF) IVPB 1 g/50 mL premix     1 g 100 mL/hr over 30 Minutes Intravenous Every 6 hours 05/08/16 1507 05/09/16 0035   05/08/16 0938  ceFAZolin (ANCEF) IVPB 2g/100 mL premix     2 g 200 mL/hr over 30 Minutes Intravenous On call to O.R. 05/08/16 0938 05/08/16 1130       Patient was given sequential compression devices, early ambulation, and chemoprophylaxis to prevent DVT.  Patient benefited maximally from hospital stay and there were no complications.    Recent vital signs: Patient Vitals for the past 24 hrs:  BP Temp Temp src Pulse Resp SpO2  05/11/16 0350 136/86 99 F (37.2 C) Oral (!) 109 18 90 %  05/11/16  0111 - (!) 102 F (38.9 C) - - - -  05/10/16 2235 - 98.4 F (36.9 C) - - - -  05/10/16 2123 119/63 99.3 F (37.4 C) Oral (!) 107 18 92 %  05/10/16 1845 - 99.9 F (37.7 C) Oral - - -  05/10/16 1421 (!) 143/84 99 F (37.2 C) Oral 97 20 96 %  05/10/16 1337 - 99.4 F (37.4 C) Oral - - -  05/10/16 1120 - 98.1 F (36.7 C) Oral - - -     Recent laboratory studies:  Recent Labs  05/09/16 0357  WBC 22.0*  HGB 10.8*  HCT 32.4*  PLT 248  NA 138  K 3.7  CL 105  CO2 26  BUN 13  CREATININE 0.69  GLUCOSE 142*  CALCIUM 8.6*     Discharge Medications:     Medication List    TAKE these medications   amLODipine 5 MG tablet Commonly known as:  NORVASC Take 1 tablet (5 mg total) by mouth daily.   cholecalciferol 1000 units tablet Commonly known as:  VITAMIN D Take 2,000 Units by mouth daily.   furosemide 20 MG tablet Commonly known as:  LASIX TAKE ONE TABLET EVERY DAY   methocarbamol 500 MG tablet Commonly known as:  ROBAXIN Take 1 tablet (500 mg total) by mouth every 6 (six) hours as needed for muscle spasms.   naproxen sodium 220 MG tablet Commonly known as:  ANAPROX Take 220 mg by  mouth 2 (two) times daily as needed.   oxyCODONE-acetaminophen 5-325 MG tablet Commonly known as:  ROXICET Take 1-2 tablets by mouth every 4 (four) hours as needed.   potassium chloride SA 20 MEQ tablet Commonly known as:  K-DUR,KLOR-CON 2 po daily What changed:  how much to take  how to take this  when to take this  additional instructions   ramipril 10 MG capsule Commonly known as:  ALTACE TAKE ONE CAPSULE BY MOUTH EVERY DAY   rivaroxaban 10 MG Tabs tablet Commonly known as:  XARELTO Take 1 tablet (10 mg total) by mouth daily with breakfast.            Durable Medical Equipment        Start     Ordered   05/08/16 1507  DME Walker rolling  Once    Question:  Patient needs a walker to treat with the following condition  Answer:  Status post total right knee  replacement   05/08/16 1507   05/08/16 1507  DME 3 n 1  Once     05/08/16 1507      Diagnostic Studies: Dg Knee Right Port  Result Date: 05/08/2016 CLINICAL DATA:  Postoperative evaluation. EXAM: PORTABLE RIGHT KNEE - 1-2 VIEW COMPARISON:  None. FINDINGS: Status post total knee arthroplasty with well-seated non cemented femoral and tibial components. Resurfaced patella. No fracture deformity or dislocation. No destructive bony lesions. Patient is in a brace, soft tissue swelling with subcutaneous gas consistent with recent surgery. IMPRESSION: Status post RIGHT knee total arthroplasty. Electronically Signed   By: Elon Alas M.D.   On: 05/08/2016 13:48    Disposition: to home  Discharge Instructions    Call MD / Call 911    Complete by:  As directed    If you experience chest pain or shortness of breath, CALL 911 and be transported to the hospital emergency room.  If you develope a fever above 101 F, pus (white drainage) or increased drainage or redness at the wound, or calf pain, call your surgeon's office.   Constipation Prevention    Complete by:  As directed    Drink plenty of fluids.  Prune juice may be helpful.  You may use a stool softener, such as Colace (over the counter) 100 mg twice a day.  Use MiraLax (over the counter) for constipation as needed.   Diet - low sodium heart healthy    Complete by:  As directed    Discharge patient    Complete by:  As directed    Increase activity slowly as tolerated    Complete by:  As directed       Follow-up Information    [facility] Follow up.   Specialty:  Home Health Services Why:  Home Health Physical Therapy Contact information: [place] [address] [phone]        Mcarthur Rossetti, MD Follow up in 2 week(s).   Specialty:  Orthopedic Surgery Contact information: [address] [place] [address] [phone]            Signed: Mcarthur Rossetti 05/11/2016, 7:29 AM

## 2016-05-11 NOTE — Progress Notes (Signed)
Physical Therapy Treatment Patient Details Name: Krystal Kane MRN: [id number] DOB: 12/01/1960 Today's Date: 05/11/2016    History of Present Illness Pt s/p R TKA. PMH includes OA, cancer, depression, HTN.     PT Comments    Progressing with mobility. Reviewed/practiced exercises, gait training, and stair training. All education completed. Ready to d/c from PT standpoint-made RN aware.   Follow Up Recommendations  Home health PT     Equipment Recommendations       Recommendations for Other Services       Precautions / Restrictions Precautions Precautions: Fall;Knee Required Braces or Orthoses: Knee Immobilizer - Right Knee Immobilizer - Right: Discontinue once straight leg raise with < 10 degree lag Restrictions Weight Bearing Restrictions: No RLE Weight Bearing: Weight bearing as tolerated    Mobility  Bed Mobility Overal bed mobility: Needs Assistance Bed Mobility: Supine to Sit     Supine to sit: Min assist     General bed mobility comments: small amount of assist for R LE  Transfers Overall transfer level: Needs assistance Equipment used: Rolling walker (2 wheeled) Transfers: Sit to/from Stand Sit to Stand: Min guard         General transfer comment: close guard for safety  Ambulation/Gait Ambulation/Gait assistance: Min guard Ambulation Distance (Feet): 115 Feet Assistive device: Rolling walker (2 wheeled) Gait Pattern/deviations: Step-to pattern;Step-through pattern;Antalgic     General Gait Details: close guard for safety. slow gait speed   Stairs Stairs: Yes Stairs assistance: Min guard Stair Management: Two rails;Forwards Number of Stairs: 2 General stair comments: VCs safety, technique, sequence. close guard for safety.   Wheelchair Mobility    Modified Rankin (Stroke Patients Only)       Balance                                    Cognition Arousal/Alertness: Awake/alert (pt reports feeling like her "brain is  working the way I want") Behavior During Therapy: WFL for tasks assessed/performed Overall Cognitive Status: Within Functional Limits for tasks assessed                      Exercises      General Comments        Pertinent Vitals/Pain Pain Assessment: 0-10 Pain Score: 5  Pain Location: R knee with activity Pain Descriptors / Indicators: Sore Pain Intervention(s): Repositioned;Ice applied    Home Living                      Prior Function            PT Goals (current goals can now be found in the care plan section) Progress towards PT goals: Progressing toward goals    Frequency    7X/week      PT Plan Current plan remains appropriate    Co-evaluation             End of Session Equipment Utilized During Treatment: Gait belt Activity Tolerance: Patient tolerated treatment well Patient left: in chair;with call bell/phone within reach     Time: 0937-0958 PT Time Calculation (min) (ACUTE ONLY): 21 min  Charges:  $Gait Training: 8-22 mins                    G Codes:      Weston Anna, MPT Pager: [phone]

## 2016-05-11 NOTE — Progress Notes (Signed)
Patient ID: Krystal Kane, female   DOB: [birth year], 55 y.o.   MRN: [id number] Feeling better overall and making some progress.  Vitals stable and right knee stable.  Can be discharged to home today.

## 2016-05-11 NOTE — Addendum Note (Signed)
Addendum  created 05/11/16 1046 by Lollie Sails, CRNA   Charge Capture section accepted

## 2016-05-11 NOTE — Progress Notes (Signed)
Discharge instructions and prescritions provided to the patient at discharge

## 2016-05-12 DIAGNOSIS — Z471 Aftercare following joint replacement surgery: Secondary | ICD-10-CM | POA: Diagnosis not present

## 2016-05-15 DIAGNOSIS — Z471 Aftercare following joint replacement surgery: Secondary | ICD-10-CM | POA: Diagnosis not present

## 2016-05-18 ENCOUNTER — Telehealth (INDEPENDENT_AMBULATORY_CARE_PROVIDER_SITE_OTHER): Payer: Self-pay | Admitting: *Deleted

## 2016-05-18 DIAGNOSIS — Z471 Aftercare following joint replacement surgery: Secondary | ICD-10-CM | POA: Diagnosis not present

## 2016-05-18 NOTE — Telephone Encounter (Signed)
Please advise 

## 2016-05-18 NOTE — Telephone Encounter (Signed)
They can extend her therapy per their request and can try a cpm.  Send the message back to me again so I can do the pain med part this afternoon.

## 2016-05-18 NOTE — Telephone Encounter (Signed)
Received call from [name] at [facility] stating pt is needing refills on Oxycodone 5-325mg , pt is in a lot of pain, knee is tight. Also, requesting at CPM machine to help pt with the pain since she is a 7/10 and wants to know what CB recommendation are?   Also, [facility] is requesting VO for PT 2x week/1 week and then 3x week/ 2 weeks, Please advise.  Pt number to call is [phone]  Thanks

## 2016-05-18 NOTE — Telephone Encounter (Signed)
[name] called back and states to send order for CPM to [facility]. She did not have the number to give to me.

## 2016-05-19 ENCOUNTER — Other Ambulatory Visit (INDEPENDENT_AMBULATORY_CARE_PROVIDER_SITE_OTHER): Payer: Self-pay

## 2016-05-19 ENCOUNTER — Telehealth (INDEPENDENT_AMBULATORY_CARE_PROVIDER_SITE_OTHER): Payer: Self-pay | Admitting: Orthopaedic Surgery

## 2016-05-19 MED ORDER — OXYCODONE-ACETAMINOPHEN 5-325 MG PO TABS: 1.0000 | ORAL_TABLET | ORAL | 0 refills | Status: DC | PRN

## 2016-05-19 NOTE — Telephone Encounter (Signed)
Patient aware Rx ready at front desk  

## 2016-05-19 NOTE — Telephone Encounter (Signed)
Verbal order given to [facility]  

## 2016-05-20 DIAGNOSIS — Z471 Aftercare following joint replacement surgery: Secondary | ICD-10-CM | POA: Diagnosis not present

## 2016-05-21 ENCOUNTER — Ambulatory Visit (INDEPENDENT_AMBULATORY_CARE_PROVIDER_SITE_OTHER): Payer: BLUE CROSS/BLUE SHIELD | Admitting: Orthopaedic Surgery

## 2016-05-21 DIAGNOSIS — Z96651 Presence of right artificial knee joint: Secondary | ICD-10-CM

## 2016-05-21 MED ORDER — TIZANIDINE HCL 4 MG PO TABS: 4.0000 mg | ORAL_TABLET | Freq: Three times a day (TID) | ORAL | 0 refills | Status: DC | PRN

## 2016-05-21 MED ORDER — GABAPENTIN 100 MG PO CAPS: 100.0000 mg | ORAL_CAPSULE | Freq: Three times a day (TID) | ORAL | 0 refills | Status: DC

## 2016-05-21 MED ORDER — OXYCODONE-ACETAMINOPHEN 5-325 MG PO TABS: 1.0000 | ORAL_TABLET | ORAL | 0 refills | Status: DC | PRN

## 2016-05-21 NOTE — Progress Notes (Signed)
She is 1 day shot 2 weeks status post a right total knee arthroplasty. Her incision looks good and her calf is soft. Range of motion is significantly limited. Her pain tolerance is not great. She is needed and significant narcotics to get her through this. On examination of her knee it is ligament was stable. Her calf is again soft.  This point he gave her prescription for outpatient physical therapy. I counseled her about narcotic she is noted did give her another prescription at to this Zanaflex and Neurontin. She can start back on her naproxen sodium have an her stop her blood thinner. I'll see her back in 4 weeks to see how she doing overall but no x-rays are needed.

## 2016-05-22 DIAGNOSIS — Z471 Aftercare following joint replacement surgery: Secondary | ICD-10-CM | POA: Diagnosis not present

## 2016-05-25 DIAGNOSIS — Z471 Aftercare following joint replacement surgery: Secondary | ICD-10-CM | POA: Diagnosis not present

## 2016-05-27 DIAGNOSIS — Z471 Aftercare following joint replacement surgery: Secondary | ICD-10-CM | POA: Diagnosis not present

## 2016-05-27 NOTE — Addendum Note (Signed)
Addendum  created 05/27/16 1135 by Nolon Nations, MD   Anesthesia Intra Blocks edited, Sign clinical note

## 2016-05-29 DIAGNOSIS — Z471 Aftercare following joint replacement surgery: Secondary | ICD-10-CM | POA: Diagnosis not present

## 2016-05-29 NOTE — Telephone Encounter (Signed)
Tara from [facility] called for verbal orders for 1 week 1. She also stated pt is having pain, tearful, and wont move it as much as she should be CB: [phone]

## 2016-06-01 DIAGNOSIS — Z471 Aftercare following joint replacement surgery: Secondary | ICD-10-CM | POA: Diagnosis not present

## 2016-06-01 NOTE — Telephone Encounter (Signed)
ok 

## 2016-06-01 NOTE — Telephone Encounter (Signed)
Left verbal on [facility]

## 2016-06-01 NOTE — Telephone Encounter (Signed)
See below

## 2016-06-02 DIAGNOSIS — M25561 Pain in right knee: Secondary | ICD-10-CM | POA: Diagnosis not present

## 2016-06-03 DIAGNOSIS — M25561 Pain in right knee: Secondary | ICD-10-CM | POA: Diagnosis not present

## 2016-06-05 DIAGNOSIS — M25561 Pain in right knee: Secondary | ICD-10-CM | POA: Diagnosis not present

## 2016-06-08 ENCOUNTER — Other Ambulatory Visit (INDEPENDENT_AMBULATORY_CARE_PROVIDER_SITE_OTHER): Payer: Self-pay | Admitting: Orthopaedic Surgery

## 2016-06-08 DIAGNOSIS — M25561 Pain in right knee: Secondary | ICD-10-CM | POA: Diagnosis not present

## 2016-06-08 NOTE — Telephone Encounter (Signed)
Please advise 

## 2016-06-10 DIAGNOSIS — M25561 Pain in right knee: Secondary | ICD-10-CM | POA: Diagnosis not present

## 2016-06-12 DIAGNOSIS — M25561 Pain in right knee: Secondary | ICD-10-CM | POA: Diagnosis not present

## 2016-06-16 DIAGNOSIS — M25561 Pain in right knee: Secondary | ICD-10-CM | POA: Diagnosis not present

## 2016-06-17 DIAGNOSIS — M25561 Pain in right knee: Secondary | ICD-10-CM | POA: Diagnosis not present

## 2016-06-19 DIAGNOSIS — M25561 Pain in right knee: Secondary | ICD-10-CM | POA: Diagnosis not present

## 2016-06-23 DIAGNOSIS — M25561 Pain in right knee: Secondary | ICD-10-CM | POA: Diagnosis not present

## 2016-06-24 ENCOUNTER — Ambulatory Visit (INDEPENDENT_AMBULATORY_CARE_PROVIDER_SITE_OTHER): Payer: BLUE CROSS/BLUE SHIELD | Admitting: Orthopaedic Surgery

## 2016-06-24 DIAGNOSIS — Z96651 Presence of right artificial knee joint: Secondary | ICD-10-CM | POA: Diagnosis not present

## 2016-06-24 DIAGNOSIS — M25561 Pain in right knee: Secondary | ICD-10-CM

## 2016-06-24 MED ORDER — OXYCODONE-ACETAMINOPHEN 5-325 MG PO TABS: 1.0000 | ORAL_TABLET | Freq: Three times a day (TID) | ORAL | 0 refills | Status: DC | PRN

## 2016-06-24 MED ORDER — METHYLPREDNISOLONE ACETATE 40 MG/ML IJ SUSP
40.0000 mg | INTRAMUSCULAR | Status: AC | PRN
  Administered 2016-06-24: 40 mg via INTRA_ARTICULAR

## 2016-06-24 MED ORDER — LIDOCAINE HCL 1 % IJ SOLN
3.0000 mL | INTRAMUSCULAR | Status: AC | PRN
  Administered 2016-06-24: 3 mL

## 2016-06-24 NOTE — Progress Notes (Signed)
Office Visit Note   Patient: Krystal Kane           Date of Birth: 08/26/1960           MRN: [id number] Visit Date: 06/24/2016              Requested by: Elby Showers, MD [address] [place], [place] [address] PCP: Elby Showers, MD   Assessment & Plan: Visit Diagnoses:  1. Status post total right knee replacement     Plan: She tolerated the injection well. I like see her back in just 2 weeks. This will be just to evaluate her range of motion. No x-rays are needed. I feel that there is a good chance she will push past the pain and get her knee moving better. At her next visit if she is not consistently getting well past 90 she may need a manipulation but I'm encouraged by the fact that she is getting this far that she may not need one at all.  Follow-Up Instructions: Return in about 2 weeks (around 07/08/2016).   Orders:  Orders Placed This Encounter  Procedures  . Large Joint Injection/Arthrocentesis   No orders of the defined types were placed in this encounter.     Procedures: Large Joint Inj Date/Time: 06/24/2016 2:52 PM Performed by: Mcarthur Rossetti Authorized by: Mcarthur Rossetti   Location:  Knee Ultrasound Guidance: No   Fluoroscopic Guidance: No   Arthrogram: No   Medications:  3 mL lidocaine 1 %; 40 mg methylPREDNISolone acetate 40 MG/ML     Clinical Data: No additional findings.   Subjective: Chief Complaint  Patient presents with  . Right Knee - Follow-up    HPI  Review of Systems   Objective: Vital Signs: There were no vitals taken for this visit.  Physical Exam  Ortho Exam  She is now 6 weeks postoperative and has almost full extension to about 90 of flexion of her right knee. There is no evidence infection. Specialty Comments:  No specialty comments available.  Imaging: No results found.   PMFS History: Patient Active Problem List   Diagnosis Date Noted  . Status post total right knee replacement  05/08/2016  . 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 Anxiety 09/19/2011  . Unilateral primary osteoarthritis, right knee 09/19/2011  . History of kidney stones 09/19/2011  . Hypertension 12/15/2010  . Obesity 12/15/2010  . Depression 12/15/2010  . History of breast cancer 12/15/2010   Past Medical History:  Diagnosis Date  . Allergy    seasonal  . Arthritis    osteoarthritis  . Cancer ([place])    ductal ca in situ rt breast  . Depression   . Family history of adverse reaction to anesthesia    Father died from reaction to Anesthesia 1971  . Fluid retention   . History of kidney stones   . Hypertension     Family History  Problem Relation Age of Onset  . Cancer Mother   . Hypertension Mother   . Breast cancer Mother   . Hypertension Brother   . Colon cancer Neg Hx     Past Surgical History:  Procedure Laterality Date  . ABDOMINAL HYSTERECTOMY    . APPENDECTOMY    . BREAST SURGERY     rt lumpectomy  . KNEE ARTHROSCOPY  02/2010   right  . LAPAROSCOPIC APPENDECTOMY  10/2010  . TOTAL KNEE ARTHROPLASTY Right 05/08/2016   Procedure: RIGHT TOTAL KNEE ARTHROPLASTY;  Surgeon: Mcarthur Rossetti, MD;  Location: [facility];  Service: Orthopedics;  Laterality: Right;   Social History   Occupational History  . Not on file.   Social History Main Topics  . Smoking status: Current Some Day Smoker    Packs/day: 0.10    Types: Cigarettes  . Smokeless tobacco: Never Used     Comment: pt is attempting to quit. Smokes 2 a day.   . Alcohol use 0.0 oz/week  . Drug use: No  . Sexual activity: Not on file

## 2016-06-25 DIAGNOSIS — M25561 Pain in right knee: Secondary | ICD-10-CM | POA: Diagnosis not present

## 2016-06-29 ENCOUNTER — Other Ambulatory Visit (INDEPENDENT_AMBULATORY_CARE_PROVIDER_SITE_OTHER): Payer: Self-pay | Admitting: Orthopaedic Surgery

## 2016-06-30 DIAGNOSIS — M25561 Pain in right knee: Secondary | ICD-10-CM | POA: Diagnosis not present

## 2016-06-30 NOTE — Telephone Encounter (Signed)
Please advise 

## 2016-07-10 DIAGNOSIS — M25561 Pain in right knee: Secondary | ICD-10-CM | POA: Diagnosis not present

## 2016-07-13 ENCOUNTER — Ambulatory Visit (INDEPENDENT_AMBULATORY_CARE_PROVIDER_SITE_OTHER): Payer: BLUE CROSS/BLUE SHIELD | Admitting: Orthopaedic Surgery

## 2016-07-13 DIAGNOSIS — Z96651 Presence of right artificial knee joint: Secondary | ICD-10-CM

## 2016-07-13 NOTE — Progress Notes (Signed)
The patient is now 9 weeks status post a right total knee replacement. She's going to physical therapy. She has been making progress.  Thephysical therapy show that she is getting now actively to 90-95 and passively 200. Her extension is full.  We'll see her back in 2 weeks. If she is not making any improvement in range of motion or has plateaued we would definitely need to perform a manipulation under anesthesia. We talked to her about this in detail. We'll see how she is doing on her clinical exam and with range of motion in 2 weeks.

## 2016-07-28 DIAGNOSIS — M25561 Pain in right knee: Secondary | ICD-10-CM | POA: Diagnosis not present

## 2016-07-29 ENCOUNTER — Ambulatory Visit (INDEPENDENT_AMBULATORY_CARE_PROVIDER_SITE_OTHER): Payer: BLUE CROSS/BLUE SHIELD | Admitting: Orthopaedic Surgery

## 2016-07-29 DIAGNOSIS — Z96651 Presence of right artificial knee joint: Secondary | ICD-10-CM

## 2016-07-29 DIAGNOSIS — Z23 Encounter for immunization: Secondary | ICD-10-CM | POA: Diagnosis not present

## 2016-07-29 NOTE — Progress Notes (Signed)
The patient is now 11 weeks status post a right total knee replacement. She says her pain is much better overall and she's doing better than she was before she had surgery.  On examination incision is well-healed. Her swelling is minimal. Her extension is almost full. Her flexion is to at least 100 105. She's made excellent progress at this point with therapy as well. She is transition to home exercise program she is doing at a gym several days a week as well.  I gave her a prescription for hydrocodone to take as needed for continuing to wean her down for medications. I we'll have her follow up in 3 months and I would like an AP and lateral of her right knee at that visit.

## 2016-07-31 DIAGNOSIS — J111 Influenza due to unidentified influenza virus with other respiratory manifestations: Secondary | ICD-10-CM | POA: Diagnosis not present

## 2016-08-18 ENCOUNTER — Other Ambulatory Visit: Payer: Self-pay | Admitting: Internal Medicine

## 2016-08-18 DIAGNOSIS — Z1231 Encounter for screening mammogram for malignant neoplasm of breast: Secondary | ICD-10-CM

## 2016-08-20 ENCOUNTER — Other Ambulatory Visit: Payer: Self-pay | Admitting: Internal Medicine

## 2016-09-04 ENCOUNTER — Other Ambulatory Visit: Payer: Self-pay | Admitting: Internal Medicine

## 2016-09-10 ENCOUNTER — Ambulatory Visit
Admission: RE | Admit: 2016-09-10 | Discharge: 2016-09-10 | Disposition: A | Discharge status: Home / Self Care | Payer: BLUE CROSS/BLUE SHIELD | Source: Ambulatory Visit | Attending: Internal Medicine | Admitting: Internal Medicine

## 2016-09-10 DIAGNOSIS — Z1231 Encounter for screening mammogram for malignant neoplasm of breast: Secondary | ICD-10-CM

## 2016-09-10 HISTORY — DX: Personal history of irradiation: Z92.3

## 2016-09-18 ENCOUNTER — Other Ambulatory Visit: Payer: Self-pay | Admitting: Internal Medicine

## 2016-10-06 ENCOUNTER — Other Ambulatory Visit: Payer: BLUE CROSS/BLUE SHIELD | Admitting: Internal Medicine

## 2016-10-06 DIAGNOSIS — E785 Hyperlipidemia, unspecified: Secondary | ICD-10-CM | POA: Diagnosis not present

## 2016-10-06 DIAGNOSIS — I10 Essential (primary) hypertension: Secondary | ICD-10-CM

## 2016-10-06 LAB — BASIC METABOLIC PANEL
BUN: 14 mg/dL (ref 7–25)
CALCIUM: 9.7 mg/dL (ref 8.6–10.4)
CHLORIDE: 104 mmol/L (ref 98–110)
CO2: 23 mmol/L (ref 20–31)
Creat: 0.66 mg/dL (ref 0.50–1.05)
GLUCOSE: 96 mg/dL (ref 65–99)
Potassium: 4.3 mmol/L (ref 3.5–5.3)
SODIUM: 143 mmol/L (ref 135–146)

## 2016-10-06 LAB — LIPID PANEL
Cholesterol: 218 mg/dL — ABNORMAL HIGH (ref <200)
HDL: 58 mg/dL (ref >50)
LDL CALC: 122 mg/dL — AB (ref <100)
TRIGLYCERIDES: 189 mg/dL — AB (ref <150)
Total CHOL/HDL Ratio: 3.8 Ratio (ref <5.0)
VLDL: 38 mg/dL — ABNORMAL HIGH (ref <30)

## 2016-10-09 ENCOUNTER — Ambulatory Visit (INDEPENDENT_AMBULATORY_CARE_PROVIDER_SITE_OTHER): Payer: BLUE CROSS/BLUE SHIELD | Admitting: Internal Medicine

## 2016-10-09 ENCOUNTER — Encounter: Payer: Self-pay | Admitting: Internal Medicine

## 2016-10-09 VITALS — BP 142/90 | HR 104 | Temp 98.4°F | Ht 62.0 in | Wt 201.0 lb

## 2016-10-09 DIAGNOSIS — E876 Hypokalemia: Secondary | ICD-10-CM | POA: Diagnosis not present

## 2016-10-09 DIAGNOSIS — I10 Essential (primary) hypertension: Secondary | ICD-10-CM | POA: Diagnosis not present

## 2016-10-09 DIAGNOSIS — E782 Mixed hyperlipidemia: Secondary | ICD-10-CM

## 2016-10-09 MED ORDER — LOSARTAN POTASSIUM 100 MG PO TABS: 100.0000 mg | ORAL_TABLET | Freq: Every day | ORAL | 0 refills | Status: DC

## 2016-10-09 NOTE — Patient Instructions (Signed)
It was a pleasure to see you today. Switch Ramapril to losartan 100 mg daily and return May 18. Ask pharmacist if you can have a smaller potassium tablet. Continue diet exercise and weight loss efforts. Congratulations on quitting smoking.

## 2016-10-09 NOTE — Progress Notes (Signed)
   Subjective:    Patient ID: Krystal Kane, female    DOB: Nov 09, 1960, 56 y.o.   MRN: [id number]  HPI 56 year old Female for follow up of  HTN and hyperlipidemia.In November she had right knee replacement by Dr. Ninfa Linden and is doing well from that. She's currently working 3 days a week but says as of May she will no longer be working. Want spend time with grandchildren.  She is here for six-month follow-up on hypertension and hyperlipidemia. Her lipids have improved. Total cholesterol was 244 and is now 218. Triglycerides were 218 and are now 189. LDL cholesterol was 149 and is now 122.  She does not want to be a lipid-lowering medication.  She has a history of hypokalemia and potassium is normal on 2 potassium tablets daily. She's complaining of these are difficult to swallow. She should speak with pharmacist about that to see if there is another brand that can be ordered for her.   She's trying to exercise a bit. Wants to lose more weight.  Diastolic blood pressure is elevated. This seems to be an ongoing problem. It seems to run about 90 at home she says. She's been on Ramapril 10 mg daily for a long time. I'm going to switch her to losartan 100 mg daily and continue furosemide and amlodipine. She'll return May 18 for follow-up. If losartan is not working, we may need to add Benicar.  Review of Systems     Objective:   Physical Exam Neck supple without thyromegaly. Chest clear. Cardiac exam regular rate and rhythm normal S1 and S2. Extremities without dependent edema.       Assessment & Plan:  Essential hypertension-change Ramapril to losartan  Hypokalemia-potassium stable on current regimen but she can speak with pharmacist about a smaller tablet  Obesity-continue diet exercise  Hyperlipidemia-improved-continue diet and exercise and she does not want to be on statin therapy  History of smoking-quit November 2018 at time of knee surgery and has not resumed it  Plan: Return May  18 for follow-up on hypertension.

## 2016-10-27 ENCOUNTER — Ambulatory Visit (INDEPENDENT_AMBULATORY_CARE_PROVIDER_SITE_OTHER): Payer: BLUE CROSS/BLUE SHIELD | Admitting: Orthopaedic Surgery

## 2016-11-02 ENCOUNTER — Ambulatory Visit: Payer: BLUE CROSS/BLUE SHIELD | Admitting: Internal Medicine

## 2016-11-04 ENCOUNTER — Ambulatory Visit (INDEPENDENT_AMBULATORY_CARE_PROVIDER_SITE_OTHER): Payer: BLUE CROSS/BLUE SHIELD | Admitting: Orthopaedic Surgery

## 2016-11-04 ENCOUNTER — Ambulatory Visit (INDEPENDENT_AMBULATORY_CARE_PROVIDER_SITE_OTHER): Payer: Self-pay

## 2016-11-04 ENCOUNTER — Encounter (INDEPENDENT_AMBULATORY_CARE_PROVIDER_SITE_OTHER): Payer: Self-pay | Admitting: Orthopaedic Surgery

## 2016-11-04 VITALS — Ht 62.0 in | Wt 201.0 lb

## 2016-11-04 DIAGNOSIS — Z96651 Presence of right artificial knee joint: Secondary | ICD-10-CM

## 2016-11-04 NOTE — Progress Notes (Signed)
Office Visit Note   Patient: Krystal Kane           Date of Birth: April 02, 1961           MRN: [id number] Visit Date: 11/04/2016              Requested by: Elby Showers, MD [address] [place], [place] [address] PCP: Elby Showers, MD   Assessment & Plan: Visit Diagnoses:  1. Status post total right knee replacement     Plan: She'll continue her quad strengthening exercises and alternate between ice and heat. She is doing rather well functionally [place], still work on her mobility in general. We'll see her back in 6 months. I would like a final AP and lateral right knee at that visit.  Follow-Up Instructions: Return in about 6 months (around 05/07/2017).   Orders:  Orders Placed This Encounter  Procedures  . XR Knee 1-2 Views Right   No orders of the defined types were placed in this encounter.     Procedures: No procedures performed   Clinical Data: No additional findings.   Subjective: Chief Complaint  Patient presents with  . Right Knee - Follow-up    3 month ROV RTKA 6 months post op   The patient is 6 months status post a right total knee arthroplasty. She has no complaints and says she is doing well. HPI  Review of Systems He denies any headache, chest pain, fever, chills, nausea, vomiting, shortness of breath  Objective: Vital Signs: Ht 5\' 2"  (1.575 m)   Wt 201 lb (91.2 kg)   BMI 36.76 kg/m   Physical Exam She is alert and oriented 3 and in no acute distress Ortho Exam Examination of her right knee shows minimal swelling. Her extension is full and her flexion is about 115. The knee feels ligaments stable. There is no evidence infection. Calf is soft. Specialty Comments:  No specialty comments available.  Imaging: Xr Knee 1-2 Views Right  Result Date: 11/04/2016 2 views of the right knee show a total knee arthroplasty with no complicating features. There is no acute changes. The alignment is well maintained. There is no  effusion.    PMFS History: Patient Active Problem List   Diagnosis Date Noted  . Status post total right knee replacement 05/08/2016  . 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 Anxiety 09/19/2011  . Unilateral primary osteoarthritis, right knee 09/19/2011  . History of kidney stones 09/19/2011  . Hypertension 12/15/2010  . Obesity 12/15/2010  . Depression 12/15/2010  . History of breast cancer 12/15/2010   Past Medical History:  Diagnosis Date  . Allergy    seasonal  . Arthritis    osteoarthritis  . Cancer ([place])    ductal ca in situ rt breast  . Depression   . Family history of adverse reaction to anesthesia    Father died from reaction to Anesthesia 1971  . Fluid retention   . History of kidney stones   . Hypertension   . Personal history of radiation therapy 2003    Family History  Problem Relation Age of Onset  . Cancer Mother   . Hypertension Mother   . Breast cancer Mother   . Hypertension Brother   . Colon cancer Neg Hx     Past Surgical History:  Procedure Laterality Date  . ABDOMINAL HYSTERECTOMY    . APPENDECTOMY    . BREAST BIOPSY Right 2003  . BREAST LUMPECTOMY Right 2003  . BREAST SURGERY  rt lumpectomy  . KNEE ARTHROSCOPY  02/2010   right  . LAPAROSCOPIC APPENDECTOMY  10/2010  . TOTAL KNEE ARTHROPLASTY Right 05/08/2016   Procedure: RIGHT TOTAL KNEE ARTHROPLASTY;  Surgeon: Mcarthur Rossetti, MD;  Location: [facility];  Service: Orthopedics;  Laterality: Right;   Social History   Occupational History  . Not on file.   Social History Main Topics  . Smoking status: Former Smoker    Packs/day: 0.10    Types: Cigarettes    Quit date: 05/08/2016  . Smokeless tobacco: Never Used     Comment: pt is attempting to quit. Smokes 2 a day.   . Alcohol use 0.0 oz/week  . Drug use: No  . Sexual activity: Not on file

## 2016-11-06 ENCOUNTER — Other Ambulatory Visit: Payer: Self-pay | Admitting: Internal Medicine

## 2016-12-04 ENCOUNTER — Other Ambulatory Visit: Payer: Self-pay | Admitting: Internal Medicine

## 2017-01-28 ENCOUNTER — Other Ambulatory Visit: Payer: Self-pay | Admitting: Internal Medicine

## 2017-01-28 DIAGNOSIS — D1801 Hemangioma of skin and subcutaneous tissue: Secondary | ICD-10-CM | POA: Diagnosis not present

## 2017-01-28 DIAGNOSIS — D485 Neoplasm of uncertain behavior of skin: Secondary | ICD-10-CM | POA: Diagnosis not present

## 2017-01-28 DIAGNOSIS — R21 Rash and other nonspecific skin eruption: Secondary | ICD-10-CM | POA: Diagnosis not present

## 2017-01-28 NOTE — Telephone Encounter (Signed)
CPE due after October 20th. Please call to book before refilling

## 2017-03-12 ENCOUNTER — Other Ambulatory Visit: Payer: Self-pay | Admitting: Internal Medicine

## 2017-03-22 DIAGNOSIS — L982 Febrile neutrophilic dermatosis [Sweet]: Secondary | ICD-10-CM | POA: Diagnosis not present

## 2017-03-22 DIAGNOSIS — Z79899 Other long term (current) drug therapy: Secondary | ICD-10-CM | POA: Diagnosis not present

## 2017-04-06 ENCOUNTER — Other Ambulatory Visit: Payer: Self-pay | Admitting: Internal Medicine

## 2017-04-12 ENCOUNTER — Other Ambulatory Visit: Payer: BLUE CROSS/BLUE SHIELD | Admitting: Internal Medicine

## 2017-04-12 DIAGNOSIS — Z79899 Other long term (current) drug therapy: Secondary | ICD-10-CM | POA: Diagnosis not present

## 2017-04-12 DIAGNOSIS — L982 Febrile neutrophilic dermatosis [Sweet]: Secondary | ICD-10-CM | POA: Diagnosis not present

## 2017-04-13 ENCOUNTER — Other Ambulatory Visit (INDEPENDENT_AMBULATORY_CARE_PROVIDER_SITE_OTHER): Payer: BLUE CROSS/BLUE SHIELD | Admitting: Internal Medicine

## 2017-04-13 ENCOUNTER — Encounter: Payer: BLUE CROSS/BLUE SHIELD | Admitting: Internal Medicine

## 2017-04-13 DIAGNOSIS — E782 Mixed hyperlipidemia: Secondary | ICD-10-CM | POA: Diagnosis not present

## 2017-04-13 DIAGNOSIS — F32A Depression, unspecified: Secondary | ICD-10-CM

## 2017-04-13 DIAGNOSIS — Z853 Personal history of malignant neoplasm of breast: Secondary | ICD-10-CM

## 2017-04-13 DIAGNOSIS — E669 Obesity, unspecified: Secondary | ICD-10-CM | POA: Diagnosis not present

## 2017-04-13 DIAGNOSIS — F419 Anxiety disorder, unspecified: Secondary | ICD-10-CM

## 2017-04-13 DIAGNOSIS — Z0000 Encounter for general adult medical examination without abnormal findings: Secondary | ICD-10-CM

## 2017-04-13 DIAGNOSIS — I10 Essential (primary) hypertension: Secondary | ICD-10-CM

## 2017-04-13 DIAGNOSIS — L982 Febrile neutrophilic dermatosis [Sweet]: Secondary | ICD-10-CM | POA: Diagnosis not present

## 2017-04-13 DIAGNOSIS — F329 Major depressive disorder, single episode, unspecified: Secondary | ICD-10-CM

## 2017-04-14 LAB — CBC WITH DIFFERENTIAL/PLATELET
BASOS ABS: 36 {cells}/uL (ref 0–200)
Basophils Relative: 0.4 %
EOS ABS: 80 {cells}/uL (ref 15–500)
EOS PCT: 0.9 %
HCT: 38.8 % (ref 35.0–45.0)
HEMOGLOBIN: 12.9 g/dL (ref 11.7–15.5)
Lymphs Abs: 2866 cells/uL (ref 850–3900)
MCH: 31.4 pg (ref 27.0–33.0)
MCHC: 33.2 g/dL (ref 32.0–36.0)
MCV: 94.4 fL (ref 80.0–100.0)
MONOS PCT: 7 %
MPV: 9.8 fL (ref 7.5–12.5)
NEUTROS ABS: 5296 {cells}/uL (ref 1500–7800)
NEUTROS PCT: 59.5 %
Platelets: 313 10*3/uL (ref 140–400)
RBC: 4.11 10*6/uL (ref 3.80–5.10)
RDW: 12.2 % (ref 11.0–15.0)
Total Lymphocyte: 32.2 %
WBC mixed population: 623 cells/uL (ref 200–950)
WBC: 8.9 10*3/uL (ref 3.8–10.8)

## 2017-04-14 LAB — COMPLETE METABOLIC PANEL WITH GFR
AG Ratio: 2.2 (calc) (ref 1.0–2.5)
ALBUMIN MSPROF: 4.4 g/dL (ref 3.6–5.1)
ALKALINE PHOSPHATASE (APISO): 89 U/L (ref 33–130)
ALT: 8 U/L (ref 6–29)
AST: 15 U/L (ref 10–35)
BILIRUBIN TOTAL: 0.5 mg/dL (ref 0.2–1.2)
BUN: 11 mg/dL (ref 7–25)
CHLORIDE: 104 mmol/L (ref 98–110)
CO2: 28 mmol/L (ref 20–32)
Calcium: 9.1 mg/dL (ref 8.6–10.4)
Creat: 0.57 mg/dL (ref 0.50–1.05)
GFR, Est African American: 121 mL/min/{1.73_m2} (ref >=60)
GFR, Est Non African American: 104 mL/min/{1.73_m2} (ref >=60)
GLOBULIN: 2 g/dL (ref 1.9–3.7)
GLUCOSE: 97 mg/dL (ref 65–99)
Potassium: 3.9 mmol/L (ref 3.5–5.3)
SODIUM: 140 mmol/L (ref 135–146)
Total Protein: 6.4 g/dL (ref 6.1–8.1)

## 2017-04-14 LAB — LIPID PANEL
CHOLESTEROL: 211 mg/dL — AB (ref <200)
HDL: 49 mg/dL — AB (ref >50)
LDL Cholesterol (Calc): 133 mg/dL (calc) — ABNORMAL HIGH
Non-HDL Cholesterol (Calc): 162 mg/dL (calc) — ABNORMAL HIGH (ref <130)
Total CHOL/HDL Ratio: 4.3 (calc) (ref <5.0)
Triglycerides: 159 mg/dL — ABNORMAL HIGH (ref <150)

## 2017-04-14 LAB — VITAMIN D 25 HYDROXY (VIT D DEFICIENCY, FRACTURES): Vit D, 25-Hydroxy: 34 ng/mL (ref 30–100)

## 2017-04-14 LAB — TSH: TSH: 2.54 mIU/L

## 2017-04-16 ENCOUNTER — Encounter: Payer: Self-pay | Admitting: Internal Medicine

## 2017-04-16 ENCOUNTER — Ambulatory Visit (INDEPENDENT_AMBULATORY_CARE_PROVIDER_SITE_OTHER): Payer: BLUE CROSS/BLUE SHIELD | Admitting: Internal Medicine

## 2017-04-16 VITALS — BP 138/94 | HR 99 | Temp 98.3°F | Ht 63.0 in | Wt 205.0 lb

## 2017-04-16 DIAGNOSIS — Z23 Encounter for immunization: Secondary | ICD-10-CM | POA: Diagnosis not present

## 2017-04-16 DIAGNOSIS — Z87442 Personal history of urinary calculi: Secondary | ICD-10-CM

## 2017-04-16 DIAGNOSIS — L982 Febrile neutrophilic dermatosis [Sweet]: Secondary | ICD-10-CM

## 2017-04-16 DIAGNOSIS — E876 Hypokalemia: Secondary | ICD-10-CM | POA: Diagnosis not present

## 2017-04-16 DIAGNOSIS — Z853 Personal history of malignant neoplasm of breast: Secondary | ICD-10-CM

## 2017-04-16 DIAGNOSIS — M1120 Other chondrocalcinosis, unspecified site: Secondary | ICD-10-CM | POA: Diagnosis not present

## 2017-04-16 DIAGNOSIS — E782 Mixed hyperlipidemia: Secondary | ICD-10-CM

## 2017-04-16 DIAGNOSIS — I10 Essential (primary) hypertension: Secondary | ICD-10-CM | POA: Diagnosis not present

## 2017-04-16 DIAGNOSIS — F411 Generalized anxiety disorder: Secondary | ICD-10-CM

## 2017-04-16 DIAGNOSIS — Z0000 Encounter for general adult medical examination without abnormal findings: Secondary | ICD-10-CM

## 2017-04-16 LAB — POCT URINALYSIS DIPSTICK
BILIRUBIN UA: NEGATIVE
GLUCOSE UA: NEGATIVE
KETONES UA: NEGATIVE
Leukocytes, UA: NEGATIVE
Nitrite, UA: NEGATIVE
PH UA: 7 (ref 5.0–8.0)
Protein, UA: NEGATIVE
RBC UA: NEGATIVE
SPEC GRAV UA: 1.01 (ref 1.010–1.025)
Urobilinogen, UA: 0.2 E.U./dL

## 2017-04-16 NOTE — Patient Instructions (Signed)
Patient does not want to be on lipid-lowering medication.  Continue diet and exercise regimen.  Continue current antihypertensive medications and return in 6 months.  Flu vaccine given.

## 2017-04-16 NOTE — Progress Notes (Signed)
Subjective:    Patient ID: Krystal Kane, female    DOB: Nov 20, 1960, 56 y.o.   MRN: [id number]  HPI 56 year old Female for health maintenance exam and evaluation of medical issues.  Hx HTN and hyperlipidemia. 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 who placed her on Cellcept recently. Had negative CCP. 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  Steroids seem to help clear up these lesions.  Currently has lesion on left arm, left lower leg and right leg.  Initially was treated with dapsone when her initial diagnosis was sweet syndrome  Does not take lipid mediciation and does not want to be on it.  Flu vaccine given.  History of hypokalemia related to diuretic therapy.  History of essential hypertension.  History of kidney stones and anxiety.  Is been on antihypertensive medications since Aug 17, 2007.  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  Does not smoke or consume alcohol.  Husband owns his own Architect business.  She has 3 daughters.  Family history: Mother died 08-16-12 from breast cancer which recurred after over 20 years.  Her father died at age 78 as a result of anesthesia when patient was 6 years old.  Her stepfather died of prostate cancer in 17-Aug-2015 and it was stressful settling the estate.  One sister in good health.  Half sister in good health.  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  Tumor was a poorly differentiated tumor with associated area of microinvasion.  Tumor was PR/ER negative HER-2/neu negative.  Chemotherapy was not recommended.  She had radiation and has done well however having had breast cancer because considerable anxiety.  History of kidney stones.  Had episode of hematuria in September with positive LE and blood on dipstick with negative culture.  Pseudogout diagnosed by orthopedist at [facility] treated with Naprosyn.   She had right knee surgery several years ago for torn meniscus.  Up to date article reviewed on granuloma annulare and Sweet syndrome             Review of Systems see above     Objective:   Physical Exam  Constitutional: She is oriented to person, place, and time. She appears well-developed and well-nourished. No distress.  HENT:  Head: Normocephalic and atraumatic.  Right Ear: External ear normal.  Left Ear: External ear normal.  Eyes: Pupils are equal, round, and reactive to light. Conjunctivae and EOM are normal. Right eye exhibits no discharge. Left eye exhibits no discharge. No scleral icterus.  Neck: Neck supple. No JVD present. No thyromegaly present.  Cardiovascular: Normal rate, regular rhythm and normal heart sounds.   No murmur heard. Pulmonary/Chest: She has no wheezes. She has no rales.  No masses on breast exam  Abdominal: Soft. Bowel sounds are normal. She exhibits no distension and no mass. There is no tenderness. There is no rebound and no guarding.  Musculoskeletal: She exhibits no edema.  Lymphadenopathy:    She has no cervical adenopathy.  Neurological: She is alert and oriented to person, place, and time. She has normal reflexes. No cranial nerve deficit. Coordination normal.  Skin: Skin is warm and dry. She is not diaphoretic.  Some violaceous papular lesions right lower extremity without central clearing.  1 area left arm that is papular and circular with central clearing and similar lesion on left lower extremity  Psychiatric: She has a  normal mood and affect. Her behavior is normal. Judgment and thought content normal.  Vitals reviewed.         Assessment & Plan:  Hyperlipidemia-she does not want to be on therapy.  Hyperlipidemia is sometimes associated with granuloma annulare.  Total cholesterol is 211 in 6 months ago was 218.  Triglycerides have improved from 189-159.  LDL cholesterol is now 133 and previously was 122.  Impaired glucose  tolerance-this is also associated with granuloma annularis sometimes but it is disputed is a valid association by some  Obesity  Hypokalemia due to diuretic therapy  Essential hypertension  History of breast cancer-ER/PR negative and her 2 new negative  History of anxiety  History of vitamin D deficiency  History of kidney stones  History of pseudogout  Plan: Continue current medications and return in 6 months at which time she will have fasting lipid panel and hemoglobin A1c with office visit.  She will continue to see dermatologist at [facility].

## 2017-05-03 DIAGNOSIS — L982 Febrile neutrophilic dermatosis [Sweet]: Secondary | ICD-10-CM | POA: Diagnosis not present

## 2017-05-06 ENCOUNTER — Encounter (INDEPENDENT_AMBULATORY_CARE_PROVIDER_SITE_OTHER): Payer: Self-pay | Admitting: Physician Assistant

## 2017-05-06 ENCOUNTER — Ambulatory Visit (INDEPENDENT_AMBULATORY_CARE_PROVIDER_SITE_OTHER): Payer: BLUE CROSS/BLUE SHIELD

## 2017-05-06 ENCOUNTER — Ambulatory Visit (INDEPENDENT_AMBULATORY_CARE_PROVIDER_SITE_OTHER): Payer: BLUE CROSS/BLUE SHIELD | Admitting: Physician Assistant

## 2017-05-06 DIAGNOSIS — M7051 Other bursitis of knee, right knee: Secondary | ICD-10-CM | POA: Diagnosis not present

## 2017-05-06 DIAGNOSIS — Z96651 Presence of right artificial knee joint: Secondary | ICD-10-CM | POA: Diagnosis not present

## 2017-05-06 MED ORDER — METHYLPREDNISOLONE ACETATE 40 MG/ML IJ SUSP
40.0000 mg | INTRAMUSCULAR | Status: AC | PRN
  Administered 2017-05-06: 40 mg via INTRAMUSCULAR

## 2017-05-06 MED ORDER — LIDOCAINE HCL 1 % IJ SOLN
2.0000 mL | INTRAMUSCULAR | Status: AC | PRN
  Administered 2017-05-06: 2 mL

## 2017-05-06 NOTE — Progress Notes (Signed)
Office Visit Note   Patient: Krystal Kane           Date of Birth: February 04, 1961           MRN: [id number] Visit Date: 05/06/2017              Requested by: Elby Showers, MD [address] [place], [place] [address] PCP: Elby Showers, MD   Assessment & Plan: Visit Diagnoses:  1. History of total right knee replacement   2. Pes anserinus bursitis of right knee     Plan: We will have her work on Forensic scientist.  See her back in 2 weeks to check her progress lack of.  Sooner if there are  any questions or concerns.  Follow-Up Instructions: Return in about 2 weeks (around 05/20/2017).   Orders:  Orders Placed This Encounter  Procedures  . Trigger Point Inj  . XR Knee 1-2 Views Right   No orders of the defined types were placed in this encounter.     Procedures: Trigger Point Inj Date/Time: 05/06/2017 1:29 PM Performed by: Pete Pelt, PA-C Authorized by: Pete Pelt, PA-C   Consent Given by:  Patient Indications:  Pain Total # of Trigger Points:  1 Location: lower extremity   Medications #1:  2 mL lidocaine 1 %; 40 mg methylPREDNISolone acetate 40 MG/ML Patient tolerance:  Patient tolerated the procedure well with no immediate complications     Clinical Data: No additional findings.   Subjective: Chief Complaint  Patient presents with  . Right Knee - Follow-up    HPI Mrs. Krystal Kane is now 1 year status post right total knee arthroplasty.  She states until approximately 3 weeks ago her knee was doing well.  She wore wearing heels and also had a dream where she has been without jerking her leg due to the fact she was stepping on a snake in her dream.  Since then she has had pain in the knee especially when going from a bent position to full extension.  She is having difficulty going up and down stairs which she was not having.  She is having no mechanical symptoms of the knee.  She notes she has a rash over the lateral aspect of her upper right leg  is been diagnosed as a Sweet Syndrome.  Review of Systems Denies any chest pain, shortness of breath, fevers, or chills.  Positive for right knee pain and rash right lateral leg.  Objective: Vital Signs: There were no vitals taken for this visit.  Physical Exam  Constitutional: She is oriented to person, place, and time. She appears well-developed and well-nourished. No distress.  Pulmonary/Chest: Effort normal.  Neurological: She is alert and oriented to person, place, and time.  Skin: She is not diaphoretic.  Psychiatric: She has a normal mood and affect.    Ortho Exam Right knee she has full extension full flexion.  No tenderness along medial lateral joint line.  Tenderness over the pes anserinus area.  No instability with valgus varus stressing the knee.  No abnormal warmth erythema or effusion.  She has a rash over the lateral aspect of the proximal lower leg.  Surgical incisions well-healed no signs of infection.  Calf is supple and nontender.  Specialty Comments:  No specialty comments available.  Imaging: Xr Knee 1-2 Views Right  Result Date: 05/06/2017  2 views right knee: No acute fracture.  Well-seated total knee components without any evidence of hardware failure.  No bony  abnormalities otherwise.    PMFS History: Patient Active Problem List   Diagnosis Date Noted  . Status post total right knee replacement 05/08/2016  . 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 Anxiety 09/19/2011  . Unilateral primary osteoarthritis, right knee 09/19/2011  . History of kidney stones 09/19/2011  . Hypertension 12/15/2010  . Obesity 12/15/2010  . Depression 12/15/2010  . History of breast cancer 12/15/2010   Past Medical History:  Diagnosis Date  . Allergy    seasonal  . Arthritis    osteoarthritis  . Cancer ([place])    ductal ca in situ rt breast  . Depression   . Family history of adverse reaction to anesthesia    Father died from reaction to Anesthesia 1971  . Fluid retention     . History of kidney stones   . Hypertension   . Personal history of radiation therapy 2003    Family History  Problem Relation Age of Onset  . Cancer Mother   . Hypertension Mother   . Breast cancer Mother   . Hypertension Brother   . Colon cancer Neg Hx     Past Surgical History:  Procedure Laterality Date  . ABDOMINAL HYSTERECTOMY    . APPENDECTOMY    . BREAST BIOPSY Right 2003  . BREAST LUMPECTOMY Right 2003  . BREAST SURGERY     rt lumpectomy  . KNEE ARTHROSCOPY  02/2010   right  . LAPAROSCOPIC APPENDECTOMY  10/2010  . TOTAL KNEE ARTHROPLASTY Right 05/08/2016   Procedure: RIGHT TOTAL KNEE ARTHROPLASTY;  Surgeon: Mcarthur Rossetti, MD;  Location: [facility];  Service: Orthopedics;  Laterality: Right;   Social History   Occupational History  . Not on file  Tobacco Use  . Smoking status: Former Smoker    Packs/day: 0.10    Types: Cigarettes    Last attempt to quit: 05/08/2016    Years since quitting: 0.9  . Smokeless tobacco: Never Used  . Tobacco comment: pt is attempting to quit. Smokes 2 a day.   Substance and Sexual Activity  . Alcohol use: Yes    Alcohol/week: 0.0 oz  . Drug use: No  . Sexual activity: Not on file

## 2017-05-19 ENCOUNTER — Encounter (INDEPENDENT_AMBULATORY_CARE_PROVIDER_SITE_OTHER): Payer: Self-pay | Admitting: Orthopaedic Surgery

## 2017-05-19 ENCOUNTER — Ambulatory Visit (INDEPENDENT_AMBULATORY_CARE_PROVIDER_SITE_OTHER): Payer: BLUE CROSS/BLUE SHIELD | Admitting: Orthopaedic Surgery

## 2017-05-19 DIAGNOSIS — Z96651 Presence of right artificial knee joint: Secondary | ICD-10-CM | POA: Diagnosis not present

## 2017-05-19 MED ORDER — DICLOFENAC SODIUM 1 % TD GEL: 2.0000 g | Freq: Four times a day (QID) | TRANSDERMAL | 3 refills | Status: DC

## 2017-05-19 NOTE — Progress Notes (Signed)
The patient is following up after having a steroid injection at the right knee around the pes bursa area.  She has a history of a total knee arthroplasty that we did a year ago.  He says that injection is helped great and she still having some soreness but overall is doing much better overall.  On examination of her right knee she has full range of motion of her right knee.  There is no effusion.  The knee feels ligamentously stable.  There is still some tenderness over the pes bursa but is not as significant as it was before.  At this point she will continue to increase her activities as he tolerates.  I will send in some Voltaren gel for her to have on hand if needed.  All questions and concerns were answered and addressed.  She will follow-up as needed both knee bothers her in any way at any time she knows to come see [place].

## 2017-06-02 ENCOUNTER — Telehealth: Payer: Self-pay | Admitting: Internal Medicine

## 2017-06-02 NOTE — Telephone Encounter (Signed)
Patient called in regarding her $218 bill.  States that her insurance pays 100% for his preventative visit every year.  So, she wanted to know why she was being billed for an additional office visit when this was her physical exam for this year.  Read the note and explained to the patient that she has several additional medical issues and these were discussed with her during in addition to well maintenance exam.  For this reason, there was an additional office visit charged.  Patient will make payment but she is not happy with the outcome.

## 2017-06-05 ENCOUNTER — Other Ambulatory Visit: Payer: Self-pay | Admitting: Internal Medicine

## 2017-06-30 DIAGNOSIS — Z79899 Other long term (current) drug therapy: Secondary | ICD-10-CM | POA: Diagnosis not present

## 2017-06-30 DIAGNOSIS — L982 Febrile neutrophilic dermatosis [Sweet]: Secondary | ICD-10-CM | POA: Diagnosis not present

## 2017-06-30 DIAGNOSIS — R197 Diarrhea, unspecified: Secondary | ICD-10-CM | POA: Diagnosis not present

## 2017-07-08 ENCOUNTER — Other Ambulatory Visit: Payer: Self-pay | Admitting: Internal Medicine

## 2017-07-14 ENCOUNTER — Other Ambulatory Visit: Payer: Self-pay | Admitting: Internal Medicine

## 2017-07-16 DIAGNOSIS — Z79899 Other long term (current) drug therapy: Secondary | ICD-10-CM | POA: Diagnosis not present

## 2017-07-16 DIAGNOSIS — L982 Febrile neutrophilic dermatosis [Sweet]: Secondary | ICD-10-CM | POA: Diagnosis not present

## 2017-07-16 DIAGNOSIS — R197 Diarrhea, unspecified: Secondary | ICD-10-CM | POA: Diagnosis not present

## 2017-08-02 ENCOUNTER — Other Ambulatory Visit: Payer: Self-pay | Admitting: Internal Medicine

## 2017-09-06 ENCOUNTER — Other Ambulatory Visit: Payer: Self-pay | Admitting: Internal Medicine

## 2017-09-13 DIAGNOSIS — K529 Noninfective gastroenteritis and colitis, unspecified: Secondary | ICD-10-CM | POA: Diagnosis not present

## 2017-09-13 DIAGNOSIS — R197 Diarrhea, unspecified: Secondary | ICD-10-CM | POA: Diagnosis not present

## 2017-09-13 DIAGNOSIS — K5730 Diverticulosis of large intestine without perforation or abscess without bleeding: Secondary | ICD-10-CM | POA: Diagnosis not present

## 2017-09-13 DIAGNOSIS — Z1211 Encounter for screening for malignant neoplasm of colon: Secondary | ICD-10-CM | POA: Diagnosis not present

## 2017-09-22 ENCOUNTER — Other Ambulatory Visit: Payer: Self-pay

## 2017-09-22 DIAGNOSIS — I10 Essential (primary) hypertension: Secondary | ICD-10-CM

## 2017-09-22 DIAGNOSIS — E782 Mixed hyperlipidemia: Secondary | ICD-10-CM

## 2017-10-07 ENCOUNTER — Other Ambulatory Visit: Payer: Self-pay | Admitting: Internal Medicine

## 2017-10-12 ENCOUNTER — Other Ambulatory Visit: Payer: BLUE CROSS/BLUE SHIELD | Admitting: Internal Medicine

## 2017-10-15 ENCOUNTER — Ambulatory Visit: Payer: BLUE CROSS/BLUE SHIELD | Admitting: Internal Medicine

## 2017-11-03 ENCOUNTER — Other Ambulatory Visit: Payer: Self-pay | Admitting: Internal Medicine

## 2017-11-03 DIAGNOSIS — Z1231 Encounter for screening mammogram for malignant neoplasm of breast: Secondary | ICD-10-CM

## 2017-11-24 ENCOUNTER — Ambulatory Visit
Admission: RE | Admit: 2017-11-24 | Discharge: 2017-11-24 | Disposition: A | Discharge status: Home / Self Care | Payer: BLUE CROSS/BLUE SHIELD | Source: Ambulatory Visit | Attending: Internal Medicine | Admitting: Internal Medicine

## 2017-11-24 DIAGNOSIS — Z1231 Encounter for screening mammogram for malignant neoplasm of breast: Secondary | ICD-10-CM

## 2017-12-06 ENCOUNTER — Other Ambulatory Visit: Payer: Self-pay | Admitting: Internal Medicine

## 2017-12-07 NOTE — Telephone Encounter (Signed)
She is past due for 6 month recheck and labs. She was angry about her bill at CPE. Please call her and see if she is going elsewhere or needs refills that have been requested from pharmacy. Needs 6 month recheck and labs per my note at last CPE

## 2017-12-08 NOTE — Telephone Encounter (Signed)
Spoke with patient and made lab appointment for 7/15 and 6 month recheck for 7/16 @ 9:45 a.m.   Patient will be on vacation and then has to take care of sister who is having surgery.  This will be her first availability.   Can we please fill her medication now since she has made her appointment?  She has changed her pharmacy to [facility] in [place] [employer]).  Thank you.

## 2017-12-08 NOTE — Telephone Encounter (Signed)
Refill meds x 3 months

## 2018-01-03 ENCOUNTER — Other Ambulatory Visit: Payer: BLUE CROSS/BLUE SHIELD | Admitting: Internal Medicine

## 2018-01-03 DIAGNOSIS — I10 Essential (primary) hypertension: Secondary | ICD-10-CM | POA: Diagnosis not present

## 2018-01-03 DIAGNOSIS — E782 Mixed hyperlipidemia: Secondary | ICD-10-CM

## 2018-01-03 LAB — BASIC METABOLIC PANEL
BUN: 10 mg/dL (ref 7–25)
CO2: 29 mmol/L (ref 20–32)
CREATININE: 0.58 mg/dL (ref 0.50–1.05)
Calcium: 9 mg/dL (ref 8.6–10.4)
Chloride: 106 mmol/L (ref 98–110)
GLUCOSE: 103 mg/dL — AB (ref 65–99)
Potassium: 4.2 mmol/L (ref 3.5–5.3)
Sodium: 144 mmol/L (ref 135–146)

## 2018-01-03 LAB — LIPID PANEL
CHOL/HDL RATIO: 4.7 (calc) (ref <5.0)
Cholesterol: 208 mg/dL — ABNORMAL HIGH (ref <200)
HDL: 44 mg/dL — ABNORMAL LOW (ref >50)
LDL Cholesterol (Calc): 130 mg/dL (calc) — ABNORMAL HIGH
NON-HDL CHOLESTEROL (CALC): 164 mg/dL — AB (ref <130)
Triglycerides: 196 mg/dL — ABNORMAL HIGH (ref <150)

## 2018-01-04 ENCOUNTER — Ambulatory Visit (INDEPENDENT_AMBULATORY_CARE_PROVIDER_SITE_OTHER): Payer: BLUE CROSS/BLUE SHIELD | Admitting: Internal Medicine

## 2018-01-04 ENCOUNTER — Encounter: Payer: Self-pay | Admitting: Internal Medicine

## 2018-01-04 VITALS — BP 120/80 | HR 86 | Ht 63.0 in | Wt 198.0 lb

## 2018-01-04 DIAGNOSIS — I10 Essential (primary) hypertension: Secondary | ICD-10-CM | POA: Diagnosis not present

## 2018-01-04 DIAGNOSIS — E782 Mixed hyperlipidemia: Secondary | ICD-10-CM | POA: Diagnosis not present

## 2018-01-04 DIAGNOSIS — L982 Febrile neutrophilic dermatosis [Sweet]: Secondary | ICD-10-CM

## 2018-01-04 NOTE — Progress Notes (Signed)
   Subjective:    Patient ID: Krystal Kane, female    DOB: 1960/07/10, 57 y.o.   MRN: [id number]  HPI 57 year old Female here today for follow-up on hypertension and hyperlipidemia.  She had colonoscopy in March 2019 at [facility].  She has had issues with diarrhea.  Terminal ileum biopsy was normal as was colon biopsy.  No evidence of colitis.  History of Sweet's syndrome affecting her arms.  Currently being treated with mycophenolate dapsone and corticosteroids topically.  History of normal colonoscopy March 2016.  No weight loss.  Was checked for sprue with tissue transglutaminase IgA antibody.      Review of Systems see above     Objective:   Physical Exam  Blood pressure is stable at 120/80.  BMI is 35.07.  Fasting glucose is 103.  BUN and creatinine are normal and potassium is normal.  Total cholesterol is 208, triglycerides 196 and were 159 9 months ago.  LDL cholesterol is 130 and previously was 133.      Assessment & Plan:  Hyperlipidemia-refuses statin  Obesity-says she has lost 15 pounds in the last month with diet and exercise  Elevated serum glucose-hemoglobin A1c not done with labs associated with this visit  Essential hypertension-stable on current regimen  Plan: Discussion today regarding her insurance coverage.  It would be better for her to have 2 separate visits 1 for a physical exam only in the other one to discuss her medical issues and to have one visit with a modifier applied to the physical exam visit.  She had called the office angry with her bill last year.  Therefore we will schedule physical exam in November with fasting labs and then there will be a follow-up visit to discuss her medical issues.  She has been resistant to consider statin therapy.  Wants to keep working on diet and exercise.

## 2018-01-19 ENCOUNTER — Encounter: Payer: Self-pay | Admitting: Internal Medicine

## 2018-01-19 NOTE — Patient Instructions (Addendum)
Return in November for physical examination.  Statin therapy refused.  Continue antihypertensive medication.

## 2018-03-03 ENCOUNTER — Other Ambulatory Visit: Payer: Self-pay | Admitting: Internal Medicine

## 2018-03-07 ENCOUNTER — Other Ambulatory Visit: Payer: Self-pay

## 2018-03-07 MED ORDER — POTASSIUM CHLORIDE CRYS ER 20 MEQ PO TBCR: 40.0000 meq | EXTENDED_RELEASE_TABLET | Freq: Every day | ORAL | 2 refills | Status: DC

## 2018-03-18 ENCOUNTER — Other Ambulatory Visit: Payer: Self-pay | Admitting: Internal Medicine

## 2018-03-24 DIAGNOSIS — I10 Essential (primary) hypertension: Secondary | ICD-10-CM | POA: Diagnosis not present

## 2018-03-24 DIAGNOSIS — Z0000 Encounter for general adult medical examination without abnormal findings: Secondary | ICD-10-CM | POA: Diagnosis not present

## 2018-05-03 ENCOUNTER — Other Ambulatory Visit: Payer: BLUE CROSS/BLUE SHIELD | Admitting: Internal Medicine

## 2018-05-06 ENCOUNTER — Encounter: Payer: BLUE CROSS/BLUE SHIELD | Admitting: Internal Medicine

## 2018-06-08 ENCOUNTER — Other Ambulatory Visit: Payer: Self-pay | Admitting: Internal Medicine

## 2019-01-19 DIAGNOSIS — R319 Hematuria, unspecified: Secondary | ICD-10-CM | POA: Diagnosis not present

## 2019-01-19 DIAGNOSIS — N3001 Acute cystitis with hematuria: Secondary | ICD-10-CM | POA: Diagnosis not present

## 2019-01-19 DIAGNOSIS — R399 Unspecified symptoms and signs involving the genitourinary system: Secondary | ICD-10-CM | POA: Diagnosis not present

## 2019-01-19 DIAGNOSIS — R82998 Other abnormal findings in urine: Secondary | ICD-10-CM | POA: Diagnosis not present

## 2019-03-28 DIAGNOSIS — Z79899 Other long term (current) drug therapy: Secondary | ICD-10-CM | POA: Diagnosis not present

## 2019-03-28 DIAGNOSIS — Z0000 Encounter for general adult medical examination without abnormal findings: Secondary | ICD-10-CM | POA: Diagnosis not present

## 2019-03-28 DIAGNOSIS — M79641 Pain in right hand: Secondary | ICD-10-CM | POA: Diagnosis not present

## 2019-03-28 DIAGNOSIS — Z853 Personal history of malignant neoplasm of breast: Secondary | ICD-10-CM | POA: Diagnosis not present

## 2019-03-28 DIAGNOSIS — I10 Essential (primary) hypertension: Secondary | ICD-10-CM | POA: Diagnosis not present

## 2019-03-28 DIAGNOSIS — Z13220 Encounter for screening for lipoid disorders: Secondary | ICD-10-CM | POA: Diagnosis not present

## 2019-03-28 DIAGNOSIS — M79642 Pain in left hand: Secondary | ICD-10-CM | POA: Diagnosis not present

## 2019-03-28 DIAGNOSIS — L982 Febrile neutrophilic dermatosis [Sweet]: Secondary | ICD-10-CM | POA: Diagnosis not present

## 2019-04-18 DIAGNOSIS — M19041 Primary osteoarthritis, right hand: Secondary | ICD-10-CM | POA: Diagnosis not present

## 2019-04-18 DIAGNOSIS — M19042 Primary osteoarthritis, left hand: Secondary | ICD-10-CM | POA: Diagnosis not present

## 2019-04-21 ENCOUNTER — Other Ambulatory Visit: Payer: Self-pay | Admitting: Family Medicine

## 2019-04-21 ENCOUNTER — Other Ambulatory Visit: Payer: Self-pay | Admitting: Internal Medicine

## 2019-04-21 DIAGNOSIS — Z1231 Encounter for screening mammogram for malignant neoplasm of breast: Secondary | ICD-10-CM

## 2019-04-28 DIAGNOSIS — R011 Cardiac murmur, unspecified: Secondary | ICD-10-CM | POA: Diagnosis not present

## 2019-04-28 DIAGNOSIS — I10 Essential (primary) hypertension: Secondary | ICD-10-CM | POA: Diagnosis not present

## 2019-06-12 ENCOUNTER — Ambulatory Visit
Admission: RE | Admit: 2019-06-12 | Discharge: 2019-06-12 | Disposition: A | Discharge status: Home / Self Care | Payer: BC Managed Care – PPO | Source: Ambulatory Visit | Attending: Family Medicine | Admitting: Family Medicine

## 2019-06-12 ENCOUNTER — Other Ambulatory Visit: Payer: Self-pay

## 2019-06-12 DIAGNOSIS — Z1231 Encounter for screening mammogram for malignant neoplasm of breast: Secondary | ICD-10-CM | POA: Diagnosis not present

## 2019-06-14 ENCOUNTER — Other Ambulatory Visit: Payer: Self-pay | Admitting: Family Medicine

## 2019-06-14 DIAGNOSIS — R928 Other abnormal and inconclusive findings on diagnostic imaging of breast: Secondary | ICD-10-CM

## 2019-06-22 ENCOUNTER — Other Ambulatory Visit: Payer: Self-pay

## 2019-06-22 ENCOUNTER — Ambulatory Visit
Admission: RE | Admit: 2019-06-22 | Discharge: 2019-06-22 | Disposition: A | Discharge status: Home / Self Care | Payer: BC Managed Care – PPO | Source: Ambulatory Visit | Attending: Family Medicine | Admitting: Family Medicine

## 2019-06-22 DIAGNOSIS — R928 Other abnormal and inconclusive findings on diagnostic imaging of breast: Secondary | ICD-10-CM

## 2019-10-28 ENCOUNTER — Ambulatory Visit: Payer: BC Managed Care – PPO | Attending: Internal Medicine

## 2019-10-28 DIAGNOSIS — Z23 Encounter for immunization: Secondary | ICD-10-CM

## 2019-10-28 NOTE — Progress Notes (Signed)
   Covid-19 Vaccination Clinic  Name:  Krystal Kane    MRN: [id number] DOB: 1960/09/23  10/28/2019  [name] was observed post Covid-19 immunization for 15 minutes without incident. She was provided with Vaccine Information Sheet and instruction to access the V-Safe system.   [name] was instructed to call 911 with any severe reactions post vaccine: Marland Kitchen Difficulty breathing  . Swelling of face and throat  . A fast heartbeat  . A bad rash all over body  . Dizziness and weakness   Immunizations Administered    Name Date Dose VIS Date Route   Pfizer COVID-19 Vaccine 10/28/2019  9:54 AM 0.3 mL 08/16/2018 Intramuscular   Manufacturer: [place]   Lot: [id number]   [place]: [id number]

## 2019-11-18 ENCOUNTER — Ambulatory Visit: Payer: BC Managed Care – PPO | Attending: Internal Medicine

## 2019-11-18 DIAGNOSIS — Z23 Encounter for immunization: Secondary | ICD-10-CM

## 2019-11-18 NOTE — Progress Notes (Signed)
   Covid-19 Vaccination Clinic  Name:  Krystal Kane    MRN: [id number] DOB: 05/29/1961  11/18/2019  Krystal Kane was observed post Covid-19 immunization for 15 minutes without incident. She was provided with Vaccine Information Sheet and instruction to access the V-Safe system.   Krystal Kane was instructed to call 911 with any severe reactions post vaccine: Marland Kitchen Difficulty breathing  . Swelling of face and throat  . A fast heartbeat  . A bad rash all over body  . Dizziness and weakness   Immunizations Administered    Name Date Dose VIS Date Route   Pfizer COVID-19 Vaccine 11/18/2019  9:26 AM 0.3 mL 08/16/2018 Intramuscular   Manufacturer: [place]   Lot: [id number]   [place]: [id number]

## 2019-11-21 ENCOUNTER — Ambulatory Visit: Payer: BC Managed Care – PPO

## 2020-02-07 ENCOUNTER — Ambulatory Visit: Payer: BC Managed Care – PPO | Admitting: Orthopaedic Surgery

## 2020-02-14 ENCOUNTER — Encounter: Payer: Self-pay | Admitting: Orthopaedic Surgery

## 2020-02-14 ENCOUNTER — Ambulatory Visit: Payer: Self-pay

## 2020-02-14 ENCOUNTER — Ambulatory Visit (INDEPENDENT_AMBULATORY_CARE_PROVIDER_SITE_OTHER): Payer: BC Managed Care – PPO | Admitting: Orthopaedic Surgery

## 2020-02-14 DIAGNOSIS — M25562 Pain in left knee: Secondary | ICD-10-CM

## 2020-02-14 MED ORDER — METHYLPREDNISOLONE ACETATE 40 MG/ML IJ SUSP
40.0000 mg | INTRAMUSCULAR | Status: AC | PRN
  Administered 2020-02-14: 40 mg via INTRA_ARTICULAR

## 2020-02-14 MED ORDER — LIDOCAINE HCL 1 % IJ SOLN
3.0000 mL | INTRAMUSCULAR | Status: AC | PRN
  Administered 2020-02-14: 3 mL

## 2020-02-14 NOTE — Progress Notes (Signed)
Office Visit Note   Patient: Krystal Kane           Date of Birth: 1960-07-02           MRN: [id number] Visit Date: 02/14/2020              Requested by: Wayland Salinas, MD [id number] [place],  [place] [id number] PCP: Wayland Salinas, MD   Assessment & Plan: Visit Diagnoses:  1. Acute pain of left knee     Plan: See her back in 2 weeks to see what type of response she had to the cortisone injection.  If she continues with mechanical symptoms she may require an MRI to further evaluate the knee.  Questions were encouraged and answered.  Follow-Up Instructions: Return in about 2 weeks (around 02/28/2020).   Orders:  Orders Placed This Encounter  Procedures  . Large Joint Inj  . XR Knee 1-2 Views Left   No orders of the defined types were placed in this encounter.     Procedures: Large Joint Inj: L knee on 02/14/2020 10:34 AM Indications: pain Details: 22 G 1.5 in needle, anterolateral approach  Arthrogram: No  Medications: 3 mL lidocaine 1 %; 40 mg methylPREDNISolone acetate 40 MG/ML Outcome: tolerated well, no immediate complications Procedure, treatment alternatives, risks and benefits explained, specific risks discussed. Consent was given by the patient. Immediately prior to procedure a time out was called to verify the correct patient, procedure, equipment, support staff and site/side marked as required. Patient was prepped and draped in the usual sterile fashion.       Clinical Data: No additional findings.   Subjective: Chief Complaint  Patient presents with  . Left Knee - Pain    HPI Krystal Kane comes in today due to left knee pain.  She states that she twisted her knee, 2 weeks ago.  Initially for approximately 3 days she was unable to straighten the knee.  She has had some pain in the knee for the past 6 months had no mechanical symptoms prior to the twisting 2 weeks ago.  She initially had some giving way but this is resolved.   She still has occasional locking in the knee.  She notes decreased range of motion particularly flexion of the knee.  She is taken naproxen for the knee pain. Review of Systems Negative for fevers or chills.  See HPI otherwise negative or noncontributory.  Objective: Vital Signs: There were no vitals taken for this visit.  Physical Exam General: Well-developed well-nourished female no acute distress mood affect appropriate. Psych: Alert and oriented x3. Ortho Exam Left knee no abnormal warmth erythema or effusion.  She is full extension lacks the last few degrees of flexion.  McMurray's is negative.  Anterior drawer is negative.  Slight patellofemoral crepitus with passive range of motion.  No significant tenderness along medial lateral joint line. Specialty Comments:  No specialty comments available.  Imaging: XR Knee 1-2 Views Left  Result Date: 02/14/2020 Left knee AP lateral views: No acute fractures.  Moderate tricompartmental changes throughout the knee.  Knee is well located.  No other bony abnormalities.    PMFS History: Patient Active Problem List   Diagnosis Date Noted  . Status post total right knee replacement 05/08/2016  . 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 Anxiety 09/19/2011  . Unilateral primary osteoarthritis, right knee 09/19/2011  . History of kidney stones 09/19/2011  . Hypertension 12/15/2010  . Obesity 12/15/2010  . Depression 12/15/2010  .  History of breast cancer 12/15/2010   Past Medical History:  Diagnosis Date  . Allergy    seasonal  . Arthritis    osteoarthritis  . Cancer ([place])    ductal ca in situ rt breast  . Depression   . Family history of adverse reaction to anesthesia    Father died from reaction to Anesthesia 1971  . Fluid retention   . History of kidney stones   . Hypertension   . Personal history of radiation therapy 2003    Family History  Problem Relation Age of Onset  . Cancer Mother   . Hypertension Mother   . Breast cancer  Mother   . Hypertension Brother   . Colon cancer Neg Hx     Past Surgical History:  Procedure Laterality Date  . ABDOMINAL HYSTERECTOMY    . APPENDECTOMY    . BREAST LUMPECTOMY Right 2003  . BREAST SURGERY     rt lumpectomy  . KNEE ARTHROSCOPY  02/2010   right  . LAPAROSCOPIC APPENDECTOMY  10/2010  . TOTAL KNEE ARTHROPLASTY Right 05/08/2016   Procedure: RIGHT TOTAL KNEE ARTHROPLASTY;  Surgeon: Mcarthur Rossetti, MD;  Location: [facility];  Service: Orthopedics;  Laterality: Right;   Social History   Occupational History  . Not on file  Tobacco Use  . Smoking status: Former Smoker    Packs/day: 0.10    Types: Cigarettes    Quit date: 05/08/2016    Years since quitting: 3.7  . Smokeless tobacco: Never Used  . Tobacco comment: pt is attempting to quit. Smokes 2 a day.   Substance and Sexual Activity  . Alcohol use: Yes    Alcohol/week: 0.0 standard drinks  . Drug use: No  . Sexual activity: Not on file

## 2020-02-28 ENCOUNTER — Ambulatory Visit: Payer: BC Managed Care – PPO | Admitting: Orthopaedic Surgery

## 2020-06-03 ENCOUNTER — Other Ambulatory Visit: Payer: Self-pay | Admitting: Family Medicine

## 2020-06-03 DIAGNOSIS — Z1231 Encounter for screening mammogram for malignant neoplasm of breast: Secondary | ICD-10-CM

## 2020-07-12 ENCOUNTER — Ambulatory Visit
Admission: RE | Admit: 2020-07-12 | Discharge: 2020-07-12 | Disposition: A | Discharge status: Home / Self Care | Payer: BC Managed Care – PPO | Source: Ambulatory Visit | Attending: Family Medicine | Admitting: Family Medicine

## 2020-07-12 ENCOUNTER — Other Ambulatory Visit: Payer: Self-pay

## 2020-07-12 DIAGNOSIS — Z1231 Encounter for screening mammogram for malignant neoplasm of breast: Secondary | ICD-10-CM

## 2020-09-09 ENCOUNTER — Ambulatory Visit (INDEPENDENT_AMBULATORY_CARE_PROVIDER_SITE_OTHER): Payer: BC Managed Care – PPO | Admitting: Orthopaedic Surgery

## 2020-09-09 DIAGNOSIS — M1712 Unilateral primary osteoarthritis, left knee: Secondary | ICD-10-CM | POA: Diagnosis not present

## 2020-09-09 DIAGNOSIS — M25562 Pain in left knee: Secondary | ICD-10-CM

## 2020-09-09 DIAGNOSIS — G8929 Other chronic pain: Secondary | ICD-10-CM | POA: Diagnosis not present

## 2020-09-09 MED ORDER — METHYLPREDNISOLONE ACETATE 40 MG/ML IJ SUSP
40.0000 mg | INTRAMUSCULAR | Status: AC | PRN
  Administered 2020-09-09: 40 mg via INTRA_ARTICULAR

## 2020-09-09 MED ORDER — LIDOCAINE HCL 1 % IJ SOLN
3.0000 mL | INTRAMUSCULAR | Status: AC | PRN
  Administered 2020-09-09: 3 mL

## 2020-09-09 NOTE — Progress Notes (Signed)
Office Visit Note   Patient: Krystal Kane           Date of Birth: [birth year]           MRN: [id number] Visit Date: 09/09/2020              Requested by: Wayland Salinas, MD [address] [place],  [place] [address] PCP: Wayland Salinas, MD   Assessment & Plan: Visit Diagnoses:  1. Chronic pain of left knee   2. Unilateral primary osteoarthritis, left knee     Plan: Based on the failure of all modes of conservative treatment for her left knee including steroid injections, anti-inflammatories, activity modification, quad training exercises and weight loss, hyaluronic acid is now important for her left knee to treat the pain from osteoarthritis.  We will work on getting this ordered for her.  She has requested this as well given the fact that it did help about 4 years ago.  Follow-Up Instructions: No follow-ups on file.   Orders:  Orders Placed This Encounter  Procedures  . Large Joint Inj   No orders of the defined types were placed in this encounter.     Procedures: Large Joint Inj: L knee on 09/09/2020 4:31 PM Indications: diagnostic evaluation and pain Details: 22 G 1.5 in needle, superolateral approach  Arthrogram: No  Medications: 3 mL lidocaine 1 %; 40 mg methylPREDNISolone acetate 40 MG/ML Outcome: tolerated well, no immediate complications Procedure, treatment alternatives, risks and benefits explained, specific risks discussed. Consent was given by the patient. Immediately prior to procedure a time out was called to verify the correct patient, procedure, equipment, support staff and site/side marked as required. Patient was prepped and draped in the usual sterile fashion.       Clinical Data: No additional findings.   Subjective: Chief Complaint  Patient presents with  . Left Knee - Pain  The patient comes in today with continued left knee pain.  We have seen her for several years for this left knee.  She has tried activity  modification as well as weight loss.  She is worked on exercise program with quad strengthening exercises.  She takes anti-inflammatories and has had steroid injections in that knee.  She has not had hyaluronic acid for that knee in a long period time.  Her x-rays in August of this past year showed moderate arthritis in that left knee.  We have replaced her right knee in the past.  She is an active 60 year old female.  She denies any other acute change in her medical status.  She does report that her kneecap pops a lot at night when she goes to flex her knee from an extended position.  HPI  Review of Systems She currently denies any headache, chest pain, shortness of breath, fever, chills, nausea, vomiting  Objective: Vital Signs: There were no vitals taken for this visit.  Physical Exam She is alert and orient x3 and in no acute distress Ortho Exam Examination of her left knee shows no effusion but painful medial lateral joint line with a patellofemoral crepitation throughout the arc of motion.  The knee is stable on exam. Specialty Comments:  No specialty comments available.  Imaging: No results found.   PMFS History: Patient Active Problem List   Diagnosis Date Noted  . Unilateral primary osteoarthritis, left knee 09/09/2020  . Status post total right knee replacement 05/08/2016  . 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 Anxiety 09/19/2011  . Unilateral primary  osteoarthritis, right knee 09/19/2011  . History of kidney stones 09/19/2011  . Hypertension 12/15/2010  . Obesity 12/15/2010  . Depression 12/15/2010  . History of breast cancer 12/15/2010   Past Medical History:  Diagnosis Date  . Allergy    seasonal  . Arthritis    osteoarthritis  . Cancer ([place])    ductal ca in situ rt breast  . Depression   . Family history of adverse reaction to anesthesia    Father died from reaction to Anesthesia 1971  . Fluid retention   . History of kidney stones   . Hypertension   .  Personal history of radiation therapy 2003    Family History  Problem Relation Age of Onset  . Cancer Mother   . Hypertension Mother   . Breast cancer Mother   . Hypertension Brother   . Colon cancer Neg Hx     Past Surgical History:  Procedure Laterality Date  . ABDOMINAL HYSTERECTOMY    . APPENDECTOMY    . BREAST LUMPECTOMY Right 2003  . BREAST SURGERY     rt lumpectomy  . KNEE ARTHROSCOPY  02/2010   right  . LAPAROSCOPIC APPENDECTOMY  10/2010  . TOTAL KNEE ARTHROPLASTY Right 05/08/2016   Procedure: RIGHT TOTAL KNEE ARTHROPLASTY;  Surgeon: Mcarthur Rossetti, MD;  Location: [facility];  Service: Orthopedics;  Laterality: Right;   Social History   Occupational History  . Not on file  Tobacco Use  . Smoking status: Former Smoker    Packs/day: 0.10    Types: Cigarettes    Quit date: 05/08/2016    Years since quitting: 4.3  . Smokeless tobacco: Never Used  . Tobacco comment: pt is attempting to quit. Smokes 2 a day.   Substance and Sexual Activity  . Alcohol use: Yes    Alcohol/week: 0.0 standard drinks  . Drug use: No  . Sexual activity: Not on file

## 2020-09-11 ENCOUNTER — Telehealth: Payer: Self-pay

## 2020-09-11 NOTE — Telephone Encounter (Signed)
Left knee gel injection ?

## 2020-09-11 NOTE — Telephone Encounter (Signed)
Noted  

## 2020-10-14 ENCOUNTER — Telehealth: Payer: Self-pay

## 2020-10-14 NOTE — Telephone Encounter (Signed)
VOB has been submitted for SynviscOne, left knee. Pending BV. 

## 2020-10-15 ENCOUNTER — Telehealth: Payer: Self-pay

## 2020-10-15 ENCOUNTER — Encounter: Payer: Self-pay | Admitting: Orthopaedic Surgery

## 2020-10-15 NOTE — Telephone Encounter (Signed)
PA required for SynviscOne, left knee. Faxed completed PA form to [facility] at [phone]. Pending Approval

## 2020-10-17 ENCOUNTER — Telehealth: Payer: Self-pay

## 2020-10-17 NOTE — Telephone Encounter (Signed)
Approved for SynviscOne, left knee. Buy & Bill Covered at 100% after a deductible No Co-pay PA Approval# [id number] Valid 10/15/2020- 04/12/2021  Appt. 10/28/2020 with Dr. Ninfa Linden

## 2020-10-28 ENCOUNTER — Encounter: Payer: Self-pay | Admitting: Orthopaedic Surgery

## 2020-10-28 ENCOUNTER — Ambulatory Visit (INDEPENDENT_AMBULATORY_CARE_PROVIDER_SITE_OTHER): Payer: BC Managed Care – PPO | Admitting: Orthopaedic Surgery

## 2020-10-28 DIAGNOSIS — M1712 Unilateral primary osteoarthritis, left knee: Secondary | ICD-10-CM

## 2020-10-28 MED ORDER — HYLAN G-F 20 48 MG/6ML IX SOSY
48.0000 mg | PREFILLED_SYRINGE | INTRA_ARTICULAR | Status: AC | PRN
  Administered 2020-10-28: 48 mg via INTRA_ARTICULAR

## 2020-10-28 NOTE — Progress Notes (Signed)
   Procedure Note  Patient: Krystal Kane             Date of Birth: 03/26/1961           MRN: [id number]             Visit Date: 10/28/2020  Procedures: Visit Diagnoses:  1. Unilateral primary osteoarthritis, left knee     Large Joint Inj: L knee on 10/28/2020 10:49 AM Indications: pain and diagnostic evaluation Details: 22 G 1.5 in needle, superolateral approach  Arthrogram: No  Medications: 48 mg Hylan 48 MG/6ML Outcome: tolerated well, no immediate complications Procedure, treatment alternatives, risks and benefits explained, specific risks discussed. Consent was given by the patient. Immediately prior to procedure a time out was called to verify the correct patient, procedure, equipment, support staff and site/side marked as required. Patient was prepped and draped in the usual sterile fashion.    The patient comes in today for scheduled hyaluronic acid injection with Synvisc 1 in her left knee to treat the pain from osteoarthritis.  She has tried and failed other forms of conservative treatment including failure of steroid injection.  She does have a history of a right total knee arthroplasty.  She is fully aware why or we are trying this type of injection.  There is no knee effusion of the left knee today.  I did place Synvisc 1 in the left knee today without difficulty.  All questions and concerns were answered and addressed.  Follow-up at this point can be as needed.  She knows that she can have this repeated in 6 months if needed.  The other option we consider knee replacement surgery if this fails.

## 2021-07-21 ENCOUNTER — Other Ambulatory Visit: Payer: Self-pay | Admitting: Family Medicine

## 2021-07-21 DIAGNOSIS — Z1231 Encounter for screening mammogram for malignant neoplasm of breast: Secondary | ICD-10-CM

## 2021-07-31 ENCOUNTER — Ambulatory Visit
Admission: RE | Admit: 2021-07-31 | Discharge: 2021-07-31 | Disposition: A | Discharge status: Home / Self Care | Payer: BC Managed Care – PPO | Source: Ambulatory Visit | Attending: Family Medicine | Admitting: Family Medicine

## 2021-07-31 DIAGNOSIS — Z1231 Encounter for screening mammogram for malignant neoplasm of breast: Secondary | ICD-10-CM

## 2021-08-13 ENCOUNTER — Telehealth: Payer: Self-pay

## 2021-08-13 NOTE — Telephone Encounter (Signed)
VOB submitted for SynviscOne, left knee. BV pending. 

## 2021-08-15 ENCOUNTER — Telehealth: Payer: Self-pay

## 2021-08-15 NOTE — Telephone Encounter (Signed)
PA required for SynviscOne, left knee. Submitted online through [employer]. PA Pending# [id number]

## 2021-08-27 ENCOUNTER — Ambulatory Visit (INDEPENDENT_AMBULATORY_CARE_PROVIDER_SITE_OTHER): Payer: BC Managed Care – PPO | Admitting: Orthopaedic Surgery

## 2021-08-27 ENCOUNTER — Telehealth: Payer: Self-pay

## 2021-08-27 DIAGNOSIS — G8929 Other chronic pain: Secondary | ICD-10-CM | POA: Diagnosis not present

## 2021-08-27 DIAGNOSIS — M25562 Pain in left knee: Secondary | ICD-10-CM

## 2021-08-27 DIAGNOSIS — M1712 Unilateral primary osteoarthritis, left knee: Secondary | ICD-10-CM

## 2021-08-27 MED ORDER — LIDOCAINE HCL 1 % IJ SOLN
3.0000 mL | INTRAMUSCULAR | Status: AC | PRN
  Administered 2021-08-27: 3 mL

## 2021-08-27 MED ORDER — METHYLPREDNISOLONE ACETATE 40 MG/ML IJ SUSP
40.0000 mg | INTRAMUSCULAR | Status: AC | PRN
  Administered 2021-08-27: 40 mg via INTRA_ARTICULAR

## 2021-08-27 NOTE — Telephone Encounter (Signed)
Faxed appeal letter and recent office note to [facility] at [phone] for gel injection. ?

## 2021-08-27 NOTE — Telephone Encounter (Signed)
Left knee gel injection ?

## 2021-08-27 NOTE — Progress Notes (Signed)
? ?Office Visit Note ?  ?Patient: Krystal Kane           ?Date of Birth: Dec 23, 1960           ?MRN: [id number] ?Visit Date: 08/27/2021 ?             ?Requested by: Wayland Salinas, MD ?[address] ?[address] ?[place],  [place] [address] ?PCP: Wayland Salinas, MD ? ? ?Assessment & Plan: ?Visit Diagnoses:  ?1. Chronic pain of left knee   ?2. Unilateral primary osteoarthritis, left knee   ? ? ?Plan: In order to temporize her acute pain I did place a steroid injection in her left knee today.  However steroid injections have not really been effective for her in the past.  We are trying this now just because she is in need of plus ordering hyaluronic acid for her left knee to treat the pain from osteoarthritis.  We will work on getting that ordered.  All question concerns were answered addressed. ? ?Follow-Up Instructions: No follow-ups on file.  ? ?Orders:  ?No orders of the defined types were placed in this encounter. ? ?No orders of the defined types were placed in this encounter. ? ? ? ? Procedures: ?Large Joint Inj: L knee on 08/27/2021 2:56 PM ?Indications: diagnostic evaluation and pain ?Details: 22 G 1.5 in needle, superolateral approach ? ?Arthrogram: No ? ?Medications: 3 mL lidocaine 1 %; 40 mg methylPREDNISolone acetate 40 MG/ML ?Outcome: tolerated well, no immediate complications ?Procedure, treatment alternatives, risks and benefits explained, specific risks discussed. Consent was given by the patient. Immediately prior to procedure a time out was called to verify the correct patient, procedure, equipment, support staff and site/side marked as required. Patient was prepped and draped in the usual sterile fashion.  ? ? ? ? ?Clinical Data: ?No additional findings. ? ? ?Subjective: ?Chief Complaint  ?Patient presents with  ? Left Knee - Pain  ?The patient is well-known to me.  She has significant osteoarthritis of her left knee.  We actually have replaced her right knee.  She is an active  61 year old female.  She last had hyaluronic acid in the left knee in the spring of last year.  That is helped her significantly.  She has had a flareup of pain with the left knee from the osteoarthritis.  Steroid injections have not helped any significant time in the past and the hyaluronic's helped her the most.  The last time it was with Synvisc 1.  She is hoping to have that in her knee again.  She is not a diabetic.  She is had no acute change in her medical status and no significant injury.  She does have known varus malalignment of the left knee.  She has problems going up and down stairs.  There is patellofemoral crepitation and mainly medial joint line tenderness is where she points to. ? ?HPI ? ?Review of Systems ?There is no listed fever, chills, nausea, vomiting ? ?Objective: ?Vital Signs: There were no vitals taken for this visit. ? ?Physical Exam ?She is alert and orient x3 and in no acute distress ?Ortho Exam ?Examination of her left knee shows varus malalignment that is correctable.  There is significant medial joint line tenderness and patellofemoral crepitation.  The knee moves well and is ligamentously stable but very painful. ?Specialty Comments:  ?No specialty comments available. ? ?Imaging: ?No results found. ? ? ?PMFS History: ?Patient Active Problem List  ? Diagnosis Date Noted  ? Unilateral primary  osteoarthritis, left knee 09/09/2020  ? Status post total right knee replacement 05/08/2016  ? 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 Anxiety 09/19/2011  ? Unilateral primary osteoarthritis, right knee 09/19/2011  ? History of kidney stones 09/19/2011  ? Hypertension 12/15/2010  ? Obesity 12/15/2010  ? Depression 12/15/2010  ? History of breast cancer 12/15/2010  ? ?Past Medical History:  ?Diagnosis Date  ? Allergy   ? seasonal  ? Arthritis   ? osteoarthritis  ? Cancer [facility])   ? ductal ca in situ rt breast  ? Depression   ? Family history of adverse reaction to anesthesia   ? Father died from reaction to  Anesthesia 1971  ? Fluid retention   ? History of kidney stones   ? Hypertension   ? Personal history of radiation therapy 2003  ?  ?Family History  ?Problem Relation Age of Onset  ? Cancer Mother   ? Hypertension Mother   ? Breast cancer Mother   ? Hypertension Brother   ? Colon cancer Neg Hx   ?  ?Past Surgical History:  ?Procedure Laterality Date  ? ABDOMINAL HYSTERECTOMY    ? APPENDECTOMY    ? BREAST LUMPECTOMY Right 2003  ? BREAST SURGERY    ? rt lumpectomy  ? KNEE ARTHROSCOPY  02/2010  ? right  ? LAPAROSCOPIC APPENDECTOMY  10/2010  ? TOTAL KNEE ARTHROPLASTY Right 05/08/2016  ? Procedure: RIGHT TOTAL KNEE ARTHROPLASTY;  Surgeon: Mcarthur Rossetti, MD;  Location: [facility];  Service: Orthopedics;  Laterality: Right;  ? ?Social History  ? ?Occupational History  ? Not on file  ?Tobacco Use  ? Smoking status: Former  ?  Packs/day: 0.10  ?  Types: Cigarettes  ?  Quit date: 05/08/2016  ?  Years since quitting: 5.3  ? Smokeless tobacco: Never  ? Tobacco comments:  ?  pt is attempting to quit. Smokes 2 a day.   ?Substance and Sexual Activity  ? Alcohol use: Yes  ?  Alcohol/week: 0.0 standard drinks  ? Drug use: No  ? Sexual activity: Not on file  ? ? ? ? ? ? ?

## 2022-02-09 ENCOUNTER — Encounter: Payer: Self-pay | Admitting: Orthopaedic Surgery

## 2022-03-02 ENCOUNTER — Telehealth: Payer: Self-pay | Admitting: Orthopaedic Surgery

## 2022-03-02 ENCOUNTER — Ambulatory Visit: Payer: BC Managed Care – PPO | Admitting: Orthopaedic Surgery

## 2022-03-02 NOTE — Telephone Encounter (Signed)
Pt called and has covid so she was wondering if Dr.Blackman could do a virtual visit since it was just suppose to be able surgery?

## 2022-04-22 ENCOUNTER — Ambulatory Visit (INDEPENDENT_AMBULATORY_CARE_PROVIDER_SITE_OTHER): Payer: BC Managed Care – PPO | Admitting: Orthopaedic Surgery

## 2022-04-22 ENCOUNTER — Ambulatory Visit (INDEPENDENT_AMBULATORY_CARE_PROVIDER_SITE_OTHER): Payer: BC Managed Care – PPO

## 2022-04-22 DIAGNOSIS — M25562 Pain in left knee: Secondary | ICD-10-CM

## 2022-04-22 DIAGNOSIS — M1712 Unilateral primary osteoarthritis, left knee: Secondary | ICD-10-CM | POA: Diagnosis not present

## 2022-04-22 DIAGNOSIS — G8929 Other chronic pain: Secondary | ICD-10-CM

## 2022-04-22 NOTE — Progress Notes (Signed)
Office Visit Note   Patient: Krystal Kane           Date of Birth: 11-Jul-1960           MRN: [id number] Visit Date: 04/22/2022              Requested by: Wayland Salinas, MD [address] [place],  [place] [address] PCP: Wayland Salinas, MD   Assessment & Plan: Visit Diagnoses:  1. Chronic pain of left knee   2. Unilateral primary osteoarthritis, left knee     Plan: At this point I do agree that she is in need of a left knee arthroplasty given the failure of all forms of conservative treatment combined with her clinical exam findings and x-ray findings of the left knee.  Having had the surgery before previously on her right knee, she is fully aware of the risk and benefits of the surgery as well as what to expect from an intraoperative and postoperative course.  We will work on getting this scheduled.  All questions and concerns were answered and addressed.  Follow-Up Instructions: Return for 2 weeks post-op.   Orders:  Orders Placed This Encounter  Procedures   XR Knee 1-2 Views Left   No orders of the defined types were placed in this encounter.     Procedures: No procedures performed   Clinical Data: No additional findings.   Subjective: Chief Complaint  Patient presents with   Left Knee - Pain  The patient is a 61 year old female well-known to me.  We actually replaced her right knee successfully 6 years ago.  Her left knee has well-documented severe arthritis that is getting worse.  She has tried and failed all forms conservative treatment for well over a year now including activity modification, quad strengthening exercises, multiple injections and taking anti-inflammatories.  At this point her left knee pain is detrimentally affecting her mobility, her quality of life and actives daily living.  Her right knee replacement is done well.  She is at the point where she would like [place] to consider knee replacement for her left  knee.  HPI  Review of Systems There is currently listed no fever, chills, nausea, vomiting  Objective: Vital Signs: There were no vitals taken for this visit.  Physical Exam She is alert and orient x3 and in no acute distress Ortho Exam Examination of her left knee shows significant patellofemoral crepitation throughout the arc of motion of the knee.  There is varus malalignment that is correctable and quite significant medial joint line tenderness. Specialty Comments:  No specialty comments available.  Imaging: XR Knee 1-2 Views Left  Result Date: 04/22/2022 An AP and lateral of the left knee shows varus malalignment with significant medial joint space narrowing and patellofemoral narrowing.  There is a large osteophyte in the anterior aspect of her knee and loose body as well.  There is bone-on-bone wear medial.    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   Anxiety 09/19/2011   Unilateral primary osteoarthritis, right knee 09/19/2011   History of kidney stones 09/19/2011   Hypertension 12/15/2010   Obesity 12/15/2010   Depression 12/15/2010   History of breast cancer 12/15/2010   Past Medical History:  Diagnosis Date   Allergy    seasonal   Arthritis    osteoarthritis  Cancer ([place])    ductal ca in situ rt breast   Depression    Family history of adverse reaction to anesthesia    Father died from reaction to Anesthesia 1971   Fluid retention    History of kidney stones    Hypertension    Personal history of radiation therapy 2003    Family History  Problem Relation Age of Onset   Cancer Mother    Hypertension Mother    Breast cancer Mother    Hypertension Brother    Colon cancer Neg Hx     Past Surgical History:  Procedure Laterality Date   ABDOMINAL HYSTERECTOMY     APPENDECTOMY      BREAST LUMPECTOMY Right 2003   BREAST SURGERY     rt lumpectomy   KNEE ARTHROSCOPY  02/2010   right   LAPAROSCOPIC APPENDECTOMY  10/2010   TOTAL KNEE ARTHROPLASTY Right 05/08/2016   Procedure: RIGHT TOTAL KNEE ARTHROPLASTY;  Surgeon: Mcarthur Rossetti, MD;  Location: [facility];  Service: Orthopedics;  Laterality: Right;   Social History   Occupational History   Not on file  Tobacco Use   Smoking status: Former    Packs/day: 0.10    Types: Cigarettes    Quit date: 05/08/2016    Years since quitting: 5.9   Smokeless tobacco: Never   Tobacco comments:    pt is attempting to quit. Smokes 2 a day.   Substance and Sexual Activity   Alcohol use: Yes    Alcohol/week: 0.0 standard drinks of alcohol   Drug use: No   Sexual activity: Not on file

## 2022-07-02 ENCOUNTER — Other Ambulatory Visit: Payer: Self-pay

## 2022-07-09 ENCOUNTER — Other Ambulatory Visit: Payer: Self-pay | Admitting: Physician Assistant

## 2022-07-09 DIAGNOSIS — Z01818 Encounter for other preprocedural examination: Secondary | ICD-10-CM

## 2022-07-15 NOTE — Patient Instructions (Signed)
SURGICAL WAITING ROOM VISITATION  Patients having surgery or a procedure may have no more than 2 support people in the waiting area - these visitors may rotate.    Children under the age of 42 must have an adult with them who is not the patient.  Due to an increase in RSV and influenza rates and associated hospitalizations, children ages 57 and under may not visit patients in [place].  If the patient needs to stay at the hospital during part of their recovery, the visitor guidelines for inpatient rooms apply. Pre-op nurse will coordinate an appropriate time for 1 support person to accompany patient in pre-op.  This support person may not rotate.    Please refer to the [facility] website for the visitor guidelines for Inpatients (after your surgery is over and you are in a regular room).    Your procedure is scheduled on: 07/24/22   Report to [facility] Main Entrance    Report to admitting at 5:15 AM   Call this number if you have problems the morning of surgery [phone]   Do not eat food :After Midnight.   After Midnight you may have the following liquids until 4:15 AM DAY OF SURGERY  Water Non-Citrus Juices (without pulp, NO RED-Apple, White grape, White cranberry) Black Coffee (NO MILK/CREAM OR CREAMERS, sugar ok)  Clear Tea (NO MILK/CREAM OR CREAMERS, sugar ok) regular and decaf                             Plain Jell-O (NO RED)                                           Fruit ices (not with fruit pulp, NO RED)                                     Popsicles (NO RED)                                                               Sports drinks like Gatorade (NO RED)                  The day of surgery:  Drink ONE (1) Pre-Surgery Clear Ensure at 4:15 AM the morning of surgery. Drink in one sitting. Do not sip.  This drink was given to you during your hospital  pre-op appointment visit. Nothing else to drink after completing the  Pre-Surgery Clear  Ensure.          If you have questions, please contact your surgeon's office.   FOLLOW BOWEL PREP AND ANY ADDITIONAL PRE OP INSTRUCTIONS YOU RECEIVED FROM YOUR SURGEON'S OFFICE!!!     Oral Hygiene is also important to reduce your risk of infection.                                    Remember - BRUSH YOUR TEETH THE MORNING OF SURGERY WITH YOUR REGULAR TOOTHPASTE  DENTURES  WILL BE REMOVED PRIOR TO SURGERY PLEASE DO NOT APPLY "Poly grip" OR ADHESIVES!!!   Do NOT smoke after Midnight   Take these medicines the morning of surgery with A SIP OF WATER: Amlodipine                               You may not have any metal on your body including hair pins, jewelry, and body piercing             Do not wear make-up, lotions, powders, perfumes/cologne, or deodorant  Do not wear nail polish including gel and S&S, artificial/acrylic nails, or any other type of covering on natural nails including finger and toenails. If you have artificial nails, gel coating, etc. that needs to be removed by a nail salon please have this removed prior to surgery or surgery may need to be canceled/ delayed if the surgeon/ anesthesia feels like they are unable to be safely monitored.   Do not shave  48 hours prior to surgery.    Do not bring valuables to the hospital. [place].   Contacts, glasses, dentures or bridgework may not be worn into surgery.   Bring small overnight bag day of surgery.   DO NOT [place]. PHARMACY WILL DISPENSE MEDICATIONS LISTED ON YOUR MEDICATION LIST TO YOU DURING YOUR ADMISSION [place]!    Special Instructions: Bring a copy of your healthcare power of attorney and living will documents the day of surgery if you haven't scanned them before.              Please read over the following fact sheets you were given: IF [place] [phone] Schneiders     If you received a COVID test during your pre-op visit  it is requested that you wear a mask when out in public, stay away from anyone that may not be feeling well and notify your surgeon if you develop symptoms. If you test positive for Covid or have been in contact with anyone that has tested positive in the last 10 days please notify you surgeon.     - Preparing for Surgery Before surgery, you can play an important role.  Because skin is not sterile, your skin needs to be as free of germs as possible.  You can reduce the number of germs on your skin by washing with CHG (chlorahexidine gluconate) soap before surgery.  CHG is an antiseptic cleaner which kills germs and bonds with the skin to continue killing germs even after washing. Please DO NOT use if you have an allergy to CHG or antibacterial soaps.  If your skin becomes reddened/irritated stop using the CHG and inform your nurse when you arrive at Short Stay. Do not shave (including legs and underarms) for at least 48 hours prior to the first CHG shower.  You may shave your face/neck.  Please follow these instructions carefully:  1.  Shower with CHG Soap the night before surgery and the  morning of surgery.  2.  If you choose to wash your hair, wash your hair first as usual with your normal  shampoo.  3.  After you shampoo, rinse your hair and body thoroughly to remove the shampoo.  4.  Use CHG as you would any other liquid soap.  You can apply chg directly to the skin and wash.  Gently with a scrungie or clean washcloth.  5.  Apply the CHG Soap to your body ONLY FROM THE NECK DOWN.   Do   not use on face/ open                           Wound or open sores. Avoid contact with eyes, ears mouth and   genitals (private parts).                       Wash face,  Genitals (private parts) with your normal soap.             6.  Wash thoroughly, paying special attention to the area where your    surgery  will  be performed.  7.  Thoroughly rinse your body with warm water from the neck down.  8.  DO NOT shower/wash with your normal soap after using and rinsing off the CHG Soap.                9.  Pat yourself dry with a clean towel.            10.  Wear clean pajamas.            11.  Place clean sheets on your bed the night of your first shower and do not  sleep with pets. Day of Surgery : Do not apply any lotions/deodorants the morning of surgery.  Please wear clean clothes to the hospital/surgery center.  FAILURE TO FOLLOW THESE INSTRUCTIONS MAY RESULT IN THE CANCELLATION OF YOUR SURGERY  PATIENT SIGNATURE_________________________________  NURSE SIGNATURE__________________________________  ________________________________________________________________________  Krystal Kane  An incentive spirometer is a tool that can help keep your lungs clear and active. This tool measures how well you are filling your lungs with each breath. Taking long deep breaths may help reverse or decrease the chance of developing breathing (pulmonary) problems (especially infection) following: A long period of time when you are unable to move or be active. BEFORE THE PROCEDURE  If the spirometer includes an indicator to show your best effort, your nurse or respiratory therapist will set it to a desired goal. If possible, sit up straight or lean slightly forward. Try not to slouch. Hold the incentive spirometer in an upright position. INSTRUCTIONS FOR USE  Sit on the edge of your bed if possible, or sit up as far as you can in bed or on a chair. Hold the incentive spirometer in an upright position. Breathe out normally. Place the mouthpiece in your mouth and seal your lips tightly around it. Breathe in slowly and as deeply as possible, raising the piston or the ball toward the top of the column. Hold your breath for 3-5 seconds or for as long as possible. Allow the piston or ball to fall to the bottom of the  column. Remove the mouthpiece from your mouth and breathe out normally. Rest for a few seconds and repeat Steps 1 through 7 at least 10 times every 1-2 hours when you are awake. Take your time and take a few normal breaths between deep breaths. The spirometer may include an indicator to show your best effort. Use the indicator as a goal to work toward during each repetition. After each set of 10 deep breaths, practice coughing to be sure your  lungs are clear. If you have an incision (the cut made at the time of surgery), support your incision when coughing by placing a pillow or rolled up towels firmly against it. Once you are able to get out of bed, walk around indoors and cough well. You may stop using the incentive spirometer when instructed by your caregiver.  RISKS AND COMPLICATIONS Take your time so you do not get dizzy or light-headed. If you are in pain, you may need to take or ask for pain medication before doing incentive spirometry. It is harder to take a deep breath if you are having pain. AFTER USE Rest and breathe slowly and easily. It can be helpful to keep track of a log of your progress. Your caregiver can provide you with a simple table to help with this. If you are using the spirometer at home, follow these instructions: [place] IF:  You are having difficultly using the spirometer. You have trouble using the spirometer as often as instructed. Your pain medication is not giving enough relief while using the spirometer. You develop fever of 100.5 F (38.1 C) or higher. SEEK IMMEDIATE MEDICAL CARE IF:  You cough up bloody sputum that had not been present before. You develop fever of 102 F (38.9 C) or greater. You develop worsening pain at or near the incision site. MAKE SURE YOU:  Understand these instructions. Will watch your condition. Will get help right away if you are not doing well or get worse. Document Released: 10/19/2006 Document Revised: 08/31/2011  Document Reviewed: 12/20/2006 Exit[employer] Patient Information 2014 [employer], [place].   ________________________________________________________________________ WHAT IS A BLOOD TRANSFUSION? Blood Transfusion Information  A transfusion is the replacement of blood or some of its parts. Blood is made up of multiple cells which provide different functions. Red blood cells carry oxygen and are used for blood loss replacement. White blood cells fight against infection. Platelets control bleeding. Plasma helps clot blood. Other blood products are available for specialized needs, such as hemophilia or other clotting disorders. BEFORE THE TRANSFUSION  Who gives blood for transfusions?  Healthy volunteers who are fully evaluated to make sure their blood is safe. This is blood bank blood. Transfusion therapy is the safest it has ever been in the practice of medicine. Before blood is taken from a donor, a complete history is taken to make sure that person has no history of diseases nor engages in risky social behavior (examples are intravenous drug use or sexual activity with multiple partners). The donor's travel history is screened to minimize risk of transmitting infections, such as malaria. The donated blood is tested for signs of infectious diseases, such as HIV and hepatitis. The blood is then tested to be sure it is compatible with you in order to minimize the chance of a transfusion reaction. If you or a relative donates blood, this is often done in anticipation of surgery and is not appropriate for emergency situations. It takes many days to process the donated blood. RISKS AND COMPLICATIONS Although transfusion therapy is very safe and saves many lives, the main dangers of transfusion include:  Getting an infectious disease. Developing a transfusion reaction. This is an allergic reaction to something in the blood you were given. Every precaution is taken to prevent this. The decision to have a blood  transfusion has been considered carefully by your caregiver before blood is given. Blood is not given unless the benefits outweigh the risks. AFTER THE TRANSFUSION Right after receiving a blood transfusion, you  will usually feel much better and more energetic. This is especially true if your red blood cells have gotten low (anemic). The transfusion raises the level of the red blood cells which carry oxygen, and this usually causes an energy increase. The nurse administering the transfusion will monitor you carefully for complications. HOME CARE INSTRUCTIONS  No special instructions are needed after a transfusion. You may find your energy is better. Speak with your caregiver about any limitations on activity for underlying diseases you may have. SEEK MEDICAL CARE IF:  Your condition is not improving after your transfusion. You develop redness or irritation at the intravenous (IV) site. SEEK IMMEDIATE MEDICAL CARE IF:  Any of the following symptoms occur over the next 12 hours: Shaking chills. You have a temperature by mouth above 102 F (38.9 C), not controlled by medicine. Chest, back, or muscle pain. People around you feel you are not acting correctly or are confused. Shortness of breath or difficulty breathing. Dizziness and fainting. You get a rash or develop hives. You have a decrease in urine output. Your urine turns a dark color or changes to pink, red, or brown. Any of the following symptoms occur over the next 10 days: You have a temperature by mouth above 102 F (38.9 C), not controlled by medicine. Shortness of breath. Weakness after normal activity. The white part of the eye turns yellow (jaundice). You have a decrease in the amount of urine or are urinating less often. Your urine turns a dark color or changes to pink, red, or brown. Document Released: 06/05/2000 Document Revised: 08/31/2011 Document Reviewed: 01/23/2008 [facility] Patient Information 2014 [place],  [place].  _______________________________________________________________________

## 2022-07-15 NOTE — Progress Notes (Signed)
COVID Vaccine Completed: yes  Date of COVID positive in last 90 days:  PCP - Elenor Quinones, MD Cardiologist -   Chest x-ray -  EKG -  Stress Test -  ECHO -  Cardiac Cath -  Pacemaker/ICD device last checked: Spinal Cord Stimulator:  Bowel Prep -   Sleep Study -  CPAP -   Fasting Blood Sugar -  Checks Blood Sugar _____ times a day  Last dose of GLP1 agonist-  N/A GLP1 instructions:  N/A   Last dose of SGLT-2 inhibitors-  N/A SGLT-2 instructions: N/A   Blood Thinner Instructions: Aspirin Instructions: Last Dose:  Activity level:  Can go up a flight of stairs and perform activities of daily living without stopping and without symptoms of chest pain or shortness of breath.  Able to exercise without symptoms  Unable to go up a flight of stairs without symptoms of     Anesthesia review:   Patient denies shortness of breath, fever, cough and chest pain at PAT appointment  Patient verbalized understanding of instructions that were given to them at the PAT appointment. Patient was also instructed that they will need to review over the PAT instructions again at home before surgery.

## 2022-07-16 ENCOUNTER — Encounter (HOSPITAL_COMMUNITY)
Admission: RE | Admit: 2022-07-16 | Discharge: 2022-07-16 | Disposition: A | Discharge status: Home / Self Care | Payer: BC Managed Care – PPO | Source: Ambulatory Visit | Attending: Orthopaedic Surgery | Admitting: Orthopaedic Surgery

## 2022-07-16 ENCOUNTER — Encounter (HOSPITAL_COMMUNITY): Payer: Self-pay

## 2022-07-16 VITALS — BP 153/92 | HR 100 | Temp 98.6°F | Resp 18 | Ht 62.5 in | Wt 198.0 lb

## 2022-07-16 DIAGNOSIS — I10 Essential (primary) hypertension: Secondary | ICD-10-CM | POA: Diagnosis not present

## 2022-07-16 DIAGNOSIS — Z01818 Encounter for other preprocedural examination: Secondary | ICD-10-CM | POA: Insufficient documentation

## 2022-07-16 HISTORY — DX: Cardiac murmur, unspecified: R01.1

## 2022-07-16 HISTORY — DX: Febrile neutrophilic dermatosis (sweet): L98.2

## 2022-07-16 LAB — COMPREHENSIVE METABOLIC PANEL
ALT: 12 U/L (ref 0–44)
AST: 18 U/L (ref 15–41)
Albumin: 3.7 g/dL (ref 3.5–5.0)
Alkaline Phosphatase: 73 U/L (ref 38–126)
Anion gap: 8 (ref 5–15)
BUN: 10 mg/dL (ref 6–20)
CO2: 27 mmol/L (ref 22–32)
Calcium: 8.5 mg/dL — ABNORMAL LOW (ref 8.9–10.3)
Chloride: 105 mmol/L (ref 98–111)
Creatinine, Ser: 0.52 mg/dL (ref 0.44–1.00)
GFR, Estimated: >60 mL/min (ref >60)
Glucose, Bld: 101 mg/dL — ABNORMAL HIGH (ref 70–99)
Potassium: 4.2 mmol/L (ref 3.5–5.1)
Sodium: 140 mmol/L (ref 135–145)
Total Bilirubin: 0.6 mg/dL (ref 0.3–1.2)
Total Protein: 6.1 g/dL — ABNORMAL LOW (ref 6.5–8.1)

## 2022-07-16 LAB — SURGICAL PCR SCREEN
MRSA, PCR: NEGATIVE
Staphylococcus aureus: NEGATIVE

## 2022-07-16 LAB — CBC
HCT: 44.5 % (ref 36.0–46.0)
Hemoglobin: 14.1 g/dL (ref 12.0–15.0)
MCH: 32.4 pg (ref 26.0–34.0)
MCHC: 31.7 g/dL (ref 30.0–36.0)
MCV: 102.3 fL — ABNORMAL HIGH (ref 80.0–100.0)
Platelets: 296 10*3/uL (ref 150–400)
RBC: 4.35 MIL/uL (ref 3.87–5.11)
RDW: 13.2 % (ref 11.5–15.5)
WBC: 9.7 10*3/uL (ref 4.0–10.5)
nRBC: 0 % (ref 0.0–0.2)

## 2022-07-23 NOTE — H&P (Signed)
TOTAL KNEE ADMISSION H&P  Patient is being admitted for left total knee arthroplasty.  Subjective:  Chief Complaint:left knee pain.  HPI: Krystal Kane, 62 y.o. female, has a history of pain and functional disability in the left knee due to arthritis and has failed non-surgical conservative treatments for greater than 12 weeks to includeNSAID's and/or analgesics, corticosteriod injections, viscosupplementation injections, flexibility and strengthening excercises, and activity modification.  Onset of symptoms was gradual, starting 3 years ago with gradually worsening course since that time. The patient noted no past surgery on the left knee(s).  Patient currently rates pain in the left knee(s) at 10 out of 10 with activity. Patient has night pain, worsening of pain with activity and weight bearing, pain that interferes with activities of daily living, pain with passive range of motion, crepitus, and joint swelling.  Patient has evidence of subchondral sclerosis, periarticular osteophytes, and joint space narrowing by imaging studies. There is no active infection.  Patient Active Problem List   Diagnosis Date Noted   Unilateral primary osteoarthritis, left knee 09/09/2020   Status post total right knee replacement 05/08/2016   Sweet's syndrome 09/15/2014   Anxiety 09/19/2011   Unilateral primary osteoarthritis, right knee 09/19/2011   History of kidney stones 09/19/2011   Hypertension 12/15/2010   Obesity 12/15/2010   Depression 12/15/2010   History of breast cancer 12/15/2010   Past Medical History:  Diagnosis Date   Allergy    seasonal   Arthritis    osteoarthritis   Cancer ([place])    ductal ca in situ rt breast   Depression    Family history of adverse reaction to anesthesia    Father died from reaction to Anesthesia 1971   Fluid retention    Heart murmur    History of kidney stones    Hypertension    Personal history of radiation therapy 2003   Sweet's syndrome     Past  Surgical History:  Procedure Laterality Date   ABDOMINAL HYSTERECTOMY     APPENDECTOMY     BREAST LUMPECTOMY Right 2003   BREAST SURGERY     rt lumpectomy   KNEE ARTHROSCOPY  02/2010   right   LAPAROSCOPIC APPENDECTOMY  10/2010   TOTAL KNEE ARTHROPLASTY Right 05/08/2016   Procedure: RIGHT TOTAL KNEE ARTHROPLASTY;  Surgeon: Mcarthur Rossetti, MD;  Location: [facility];  Service: Orthopedics;  Laterality: Right;    No current facility-administered medications for this encounter.   Current Outpatient Medications  Medication Sig Dispense Refill Last Dose   amLODipine (NORVASC) 5 MG tablet Take 5 mg by mouth daily.      Cholecalciferol (VITAMIN D) 50 MCG (2000 UT) tablet Take 2,000 Units by mouth daily.      furosemide (LASIX) 20 MG tablet Take 20 mg by mouth daily.      losartan (COZAAR) 100 MG tablet TAKE 1 TABLET BY MOUTH EVERY DAY 90 tablet 0    Potassium Chloride ER 20 MEQ TBCR Take 20 mEq by mouth 2 (two) times daily.      diclofenac sodium (VOLTAREN) 1 % GEL Apply 2 g topically 4 (four) times daily. (Patient not taking: Reported on 07/14/2022) 100 g 3 Not Taking   No Known Allergies  Social History   Tobacco Use   Smoking status: Former    Packs/day: 0.10    Types: Cigarettes    Quit date: 05/08/2016    Years since quitting: 6.2   Smokeless tobacco: Never   Tobacco comments:    pt  is attempting to quit. Smokes 2 a day.   Substance Use Topics   Alcohol use: Yes    Alcohol/week: 1.0 standard drink of alcohol    Types: 1 Standard drinks or equivalent per week    Comment: occ    Family History  Problem Relation Age of Onset   Cancer Mother    Hypertension Mother    Breast cancer Mother    Hypertension Brother    Colon cancer Neg Hx      Review of Systems  Musculoskeletal:  Positive for joint swelling.  All other systems reviewed and are negative.   Objective:  Physical Exam Vitals reviewed.  Constitutional:      Appearance: Normal appearance. She is normal  weight.  HENT:     Head: Normocephalic and atraumatic.  Eyes:     Extraocular Movements: Extraocular movements intact.     Pupils: Pupils are equal, round, and reactive to light.  Cardiovascular:     Rate and Rhythm: Normal rate and regular rhythm.     Pulses: Normal pulses.  Pulmonary:     Effort: Pulmonary effort is normal.     Breath sounds: Normal breath sounds.  Abdominal:     Palpations: Abdomen is soft.  Musculoskeletal:     Cervical back: Normal range of motion and neck supple.     Left knee: Bony tenderness and crepitus present. Decreased range of motion. Tenderness present over the medial joint line and lateral joint line. Abnormal alignment and abnormal meniscus.  Neurological:     Mental Status: She is alert and oriented to person, place, and time.  Psychiatric:        Behavior: Behavior normal.     Vital signs in last 24 hours:    Labs:   Estimated body mass index is 35.64 kg/m as calculated from the following:   Height as of 07/16/22: 5' 2.5" (1.588 m).   Weight as of 07/16/22: 89.8 kg.   Imaging Review Plain radiographs demonstrate severe degenerative joint disease of the left knee(s). The overall alignment ismild varus. The bone quality appears to be excellent for age and reported activity level.      Assessment/Plan:  End stage arthritis, left knee   The patient history, physical examination, clinical judgment of the provider and imaging studies are consistent with end stage degenerative joint disease of the left knee(s) and total knee arthroplasty is deemed medically necessary. The treatment options including medical management, injection therapy arthroscopy and arthroplasty were discussed at length. The risks and benefits of total knee arthroplasty were presented and reviewed. The risks due to aseptic loosening, infection, stiffness, patella tracking problems, thromboembolic complications and other imponderables were discussed. The patient acknowledged  the explanation, agreed to proceed with the plan and consent was signed. Patient is being admitted for inpatient treatment for surgery, pain control, PT, OT, prophylactic antibiotics, VTE prophylaxis, progressive ambulation and ADL's and discharge planning. The patient is planning to be discharged home with home health services

## 2022-07-23 NOTE — Anesthesia Preprocedure Evaluation (Addendum)
Anesthesia Evaluation  Patient identified by MRN, date of birth, ID band Patient awake    Reviewed: Allergy & Precautions, NPO status , Patient's Chart, lab work & pertinent test results  History of Anesthesia Complications (+) Family history of anesthesia reaction and history of anesthetic complications (father died from reaction to anesthesia in 1971)  Airway Mallampati: III  TM Distance: >3 FB Neck ROM: Full    Dental  (+) Dental Advisory Given   Pulmonary neg shortness of breath, neg sleep apnea, neg COPD, neg recent URI, Patient abstained from smoking., former smoker   Pulmonary exam normal breath sounds clear to auscultation       Cardiovascular hypertension (amlodipine, furosemide, losartan), Pt. on medications (-) angina (-) Past MI, (-) Cardiac Stents and (-) CABG (-) dysrhythmias + Valvular Problems/Murmurs  Rhythm:Regular Rate:Normal + Systolic murmurs    Neuro/Psych  PSYCHIATRIC DISORDERS Anxiety Depression    negative neurological ROS     GI/Hepatic negative GI ROS, Neg liver ROS,,,  Endo/Other  negative endocrine ROS    Renal/GU negative Renal ROS     Musculoskeletal  (+) Arthritis , Osteoarthritis,    Abdominal  (+) + obese  Peds  Hematology negative hematology ROS (+)   Anesthesia Other Findings H/o right breast cancer   Reproductive/Obstetrics                             Anesthesia Physical Anesthesia Plan  ASA: 2  Anesthesia Plan: Regional, Spinal and MAC   Post-op Pain Management: Regional block* and Tylenol PO (pre-op)*   Induction: Intravenous  PONV Risk Score and Plan: 2 and Ondansetron, Dexamethasone, Propofol infusion and Treatment may vary due to age or medical condition  Airway Management Planned: Natural Airway and Simple Face Mask  Additional Equipment:   Intra-op Plan:   Post-operative Plan: Extubation in OR  Informed Consent: I have reviewed the  patients History and Physical, chart, labs and discussed the procedure including the risks, benefits and alternatives for the proposed anesthesia with the patient or authorized representative who has indicated his/her understanding and acceptance.     Dental advisory given  Plan Discussed with: CRNA and Anesthesiologist  Anesthesia Plan Comments: (Discussed potential risks of nerve blocks including, but not limited to, infection, bleeding, nerve damage, seizures, pneumothorax, respiratory depression, and potential failure of the block. Alternatives to nerve blocks discussed. All questions answered.  I have discussed risks of neuraxial anesthesia including but not limited to infection, bleeding, nerve injury, back pain, headache, seizures, and failure of block. Patient denies bleeding disorders and is not currently anticoagulated. Labs have been reviewed. Risks and benefits discussed. All patient's questions answered.   Discussed with patient risks of MAC including, but not limited to, minor pain or discomfort, hearing people in the room, and possible need for backup general anesthesia. Risks for general anesthesia also discussed including, but not limited to, sore throat, hoarse voice, chipped/damaged teeth, injury to vocal cords, nausea and vomiting, allergic reactions, lung infection, heart attack, stroke, and death. All questions answered. )       Anesthesia Quick Evaluation

## 2022-07-24 ENCOUNTER — Inpatient Hospital Stay (HOSPITAL_COMMUNITY)
Admission: AD | Admit: 2022-07-24 | Discharge: 2022-07-26 | DRG: 470 | Disposition: A | Discharge status: Home Health | Payer: BC Managed Care – PPO | Attending: Orthopaedic Surgery | Admitting: Orthopaedic Surgery

## 2022-07-24 ENCOUNTER — Encounter (HOSPITAL_COMMUNITY): Payer: Self-pay | Admitting: Orthopaedic Surgery

## 2022-07-24 ENCOUNTER — Other Ambulatory Visit: Payer: Self-pay

## 2022-07-24 ENCOUNTER — Observation Stay (HOSPITAL_COMMUNITY): Payer: BC Managed Care – PPO

## 2022-07-24 ENCOUNTER — Encounter (HOSPITAL_COMMUNITY)
Admission: AD | Disposition: A | Discharge status: Home Health | Payer: Self-pay | Source: Home / Self Care | Attending: Orthopaedic Surgery

## 2022-07-24 ENCOUNTER — Ambulatory Visit (HOSPITAL_COMMUNITY): Payer: BC Managed Care – PPO | Admitting: Anesthesiology

## 2022-07-24 ENCOUNTER — Ambulatory Visit (HOSPITAL_COMMUNITY): Payer: BC Managed Care – PPO

## 2022-07-24 DIAGNOSIS — M1712 Unilateral primary osteoarthritis, left knee: Secondary | ICD-10-CM | POA: Diagnosis not present

## 2022-07-24 DIAGNOSIS — Z96652 Presence of left artificial knee joint: Secondary | ICD-10-CM

## 2022-07-24 DIAGNOSIS — Z8249 Family history of ischemic heart disease and other diseases of the circulatory system: Secondary | ICD-10-CM

## 2022-07-24 DIAGNOSIS — I10 Essential (primary) hypertension: Secondary | ICD-10-CM | POA: Diagnosis present

## 2022-07-24 DIAGNOSIS — Z96651 Presence of right artificial knee joint: Secondary | ICD-10-CM | POA: Diagnosis present

## 2022-07-24 DIAGNOSIS — Z79899 Other long term (current) drug therapy: Secondary | ICD-10-CM

## 2022-07-24 DIAGNOSIS — Z853 Personal history of malignant neoplasm of breast: Secondary | ICD-10-CM

## 2022-07-24 DIAGNOSIS — Z803 Family history of malignant neoplasm of breast: Secondary | ICD-10-CM

## 2022-07-24 DIAGNOSIS — Z87891 Personal history of nicotine dependence: Secondary | ICD-10-CM

## 2022-07-24 HISTORY — PX: TOTAL KNEE ARTHROPLASTY: SHX125

## 2022-07-24 LAB — TYPE AND SCREEN
ABO/RH(D): O POS
Antibody Screen: NEGATIVE

## 2022-07-24 LAB — ABO/RH: ABO/RH(D): O POS

## 2022-07-24 SURGERY — ARTHROPLASTY, KNEE, TOTAL
Anesthesia: Monitor Anesthesia Care | Site: Knee | Laterality: Left

## 2022-07-24 MED ORDER — ACETAMINOPHEN 500 MG PO TABS
1000.0000 mg | ORAL_TABLET | Freq: Once | ORAL | Status: AC
  Administered 2022-07-24: 1000 mg via ORAL
  Filled 2022-07-24: qty 2

## 2022-07-24 MED ORDER — FENTANYL CITRATE (PF) 100 MCG/2ML IJ SOLN
INTRAMUSCULAR | Status: AC
  Filled 2022-07-24: qty 2

## 2022-07-24 MED ORDER — DEXAMETHASONE SODIUM PHOSPHATE 10 MG/ML IJ SOLN
INTRAMUSCULAR | Status: DC | PRN
  Administered 2022-07-24: 8 mg via INTRAVENOUS

## 2022-07-24 MED ORDER — ACETAMINOPHEN 325 MG PO TABS
325.0000 mg | ORAL_TABLET | Freq: Four times a day (QID) | ORAL | Status: DC | PRN
  Administered 2022-07-24 – 2022-07-25 (×3): 650 mg via ORAL
  Filled 2022-07-24 (×3): qty 2

## 2022-07-24 MED ORDER — 0.9 % SODIUM CHLORIDE (POUR BTL) OPTIME
TOPICAL | Status: DC | PRN
  Administered 2022-07-24: 1000 mL

## 2022-07-24 MED ORDER — ORAL CARE MOUTH RINSE: 15.0000 mL | Freq: Once | OROMUCOSAL | Status: AC

## 2022-07-24 MED ORDER — DOCUSATE SODIUM 100 MG PO CAPS
100.0000 mg | ORAL_CAPSULE | Freq: Two times a day (BID) | ORAL | Status: DC
  Administered 2022-07-24 – 2022-07-26 (×5): 100 mg via ORAL
  Filled 2022-07-24 (×5): qty 1

## 2022-07-24 MED ORDER — OXYCODONE HCL 5 MG PO TABS: 5.0000 mg | ORAL_TABLET | Freq: Once | ORAL | Status: DC | PRN

## 2022-07-24 MED ORDER — PHENYLEPHRINE HCL-NACL 20-0.9 MG/250ML-% IV SOLN
INTRAVENOUS | Status: DC | PRN
  Administered 2022-07-24: 50 ug/min via INTRAVENOUS

## 2022-07-24 MED ORDER — HYDROMORPHONE HCL 1 MG/ML IJ SOLN
0.5000 mg | INTRAMUSCULAR | Status: DC | PRN
  Administered 2022-07-24: 1 mg via INTRAVENOUS
  Filled 2022-07-24: qty 1

## 2022-07-24 MED ORDER — POTASSIUM CHLORIDE CRYS ER 10 MEQ PO TBCR
20.0000 meq | EXTENDED_RELEASE_TABLET | Freq: Two times a day (BID) | ORAL | Status: DC
  Administered 2022-07-24 – 2022-07-26 (×5): 20 meq via ORAL
  Filled 2022-07-24 (×5): qty 2

## 2022-07-24 MED ORDER — PANTOPRAZOLE SODIUM 40 MG PO TBEC
40.0000 mg | DELAYED_RELEASE_TABLET | Freq: Every day | ORAL | Status: DC
  Administered 2022-07-24 – 2022-07-26 (×3): 40 mg via ORAL
  Filled 2022-07-24 (×3): qty 1

## 2022-07-24 MED ORDER — PHENOL 1.4 % MT LIQD: 1.0000 | OROMUCOSAL | Status: DC | PRN

## 2022-07-24 MED ORDER — METHOCARBAMOL 500 MG PO TABS
500.0000 mg | ORAL_TABLET | Freq: Four times a day (QID) | ORAL | Status: DC | PRN
  Administered 2022-07-24 – 2022-07-25 (×3): 500 mg via ORAL
  Filled 2022-07-24 (×3): qty 1

## 2022-07-24 MED ORDER — MENTHOL 3 MG MT LOZG: 1.0000 | LOZENGE | OROMUCOSAL | Status: DC | PRN

## 2022-07-24 MED ORDER — LACTATED RINGERS IV SOLN: INTRAVENOUS | Status: DC

## 2022-07-24 MED ORDER — ONDANSETRON HCL 4 MG/2ML IJ SOLN: 4.0000 mg | Freq: Four times a day (QID) | INTRAMUSCULAR | Status: DC | PRN

## 2022-07-24 MED ORDER — METHOCARBAMOL 500 MG IVPB - SIMPLE MED
500.0000 mg | Freq: Four times a day (QID) | INTRAVENOUS | Status: DC | PRN
  Administered 2022-07-24: 500 mg via INTRAVENOUS

## 2022-07-24 MED ORDER — MIDAZOLAM HCL 2 MG/2ML IJ SOLN
INTRAMUSCULAR | Status: AC
  Filled 2022-07-24: qty 2

## 2022-07-24 MED ORDER — VITAMIN D 25 MCG (1000 UNIT) PO TABS
2000.0000 [IU] | ORAL_TABLET | Freq: Every day | ORAL | Status: DC
  Administered 2022-07-24 – 2022-07-26 (×3): 2000 [IU] via ORAL
  Filled 2022-07-24 (×3): qty 2

## 2022-07-24 MED ORDER — ONDANSETRON HCL 4 MG PO TABS: 4.0000 mg | ORAL_TABLET | Freq: Four times a day (QID) | ORAL | Status: DC | PRN

## 2022-07-24 MED ORDER — EPINEPHRINE PF 1 MG/ML IJ SOLN
INTRAMUSCULAR | Status: AC
  Filled 2022-07-24: qty 1

## 2022-07-24 MED ORDER — BUPIVACAINE HCL (PF) 0.25 % IJ SOLN
INTRAMUSCULAR | Status: AC
  Filled 2022-07-24: qty 30

## 2022-07-24 MED ORDER — METHOCARBAMOL 500 MG IVPB - SIMPLE MED
INTRAVENOUS | Status: AC
  Filled 2022-07-24: qty 55

## 2022-07-24 MED ORDER — ALUM & MAG HYDROXIDE-SIMETH 200-200-20 MG/5ML PO SUSP: 30.0000 mL | ORAL | Status: DC | PRN

## 2022-07-24 MED ORDER — OXYCODONE HCL 5 MG/5ML PO SOLN: 5.0000 mg | Freq: Once | ORAL | Status: DC | PRN

## 2022-07-24 MED ORDER — ZOLPIDEM TARTRATE 5 MG PO TABS: 5.0000 mg | ORAL_TABLET | Freq: Every evening | ORAL | Status: DC | PRN

## 2022-07-24 MED ORDER — FENTANYL CITRATE PF 50 MCG/ML IJ SOSY: 25.0000 ug | PREFILLED_SYRINGE | INTRAMUSCULAR | Status: DC | PRN

## 2022-07-24 MED ORDER — CEFAZOLIN SODIUM-DEXTROSE 1-4 GM/50ML-% IV SOLN
1.0000 g | Freq: Four times a day (QID) | INTRAVENOUS | Status: AC
  Administered 2022-07-24 (×2): 1 g via INTRAVENOUS
  Filled 2022-07-24 (×2): qty 50

## 2022-07-24 MED ORDER — LOSARTAN POTASSIUM 50 MG PO TABS
100.0000 mg | ORAL_TABLET | Freq: Every day | ORAL | Status: DC
  Administered 2022-07-24 – 2022-07-26 (×3): 100 mg via ORAL
  Filled 2022-07-24 (×3): qty 2

## 2022-07-24 MED ORDER — METOCLOPRAMIDE HCL 5 MG/ML IJ SOLN: 5.0000 mg | Freq: Three times a day (TID) | INTRAMUSCULAR | Status: DC | PRN

## 2022-07-24 MED ORDER — ONDANSETRON HCL 4 MG/2ML IJ SOLN
INTRAMUSCULAR | Status: AC
  Filled 2022-07-24: qty 2

## 2022-07-24 MED ORDER — PROPOFOL 10 MG/ML IV BOLUS
INTRAVENOUS | Status: DC | PRN
  Administered 2022-07-24 (×2): 10 mg via INTRAVENOUS

## 2022-07-24 MED ORDER — MIDAZOLAM HCL 5 MG/5ML IJ SOLN
INTRAMUSCULAR | Status: DC | PRN
  Administered 2022-07-24 (×2): 2 mg via INTRAVENOUS

## 2022-07-24 MED ORDER — DEXAMETHASONE SODIUM PHOSPHATE 10 MG/ML IJ SOLN
INTRAMUSCULAR | Status: AC
  Filled 2022-07-24: qty 1

## 2022-07-24 MED ORDER — DIPHENHYDRAMINE HCL 12.5 MG/5ML PO ELIX: 12.5000 mg | ORAL_SOLUTION | ORAL | Status: DC | PRN

## 2022-07-24 MED ORDER — SODIUM CHLORIDE 0.9 % IR SOLN
Status: DC | PRN
  Administered 2022-07-24: 1000 mL

## 2022-07-24 MED ORDER — CHLORHEXIDINE GLUCONATE 0.12 % MT SOLN
15.0000 mL | Freq: Once | OROMUCOSAL | Status: AC
  Administered 2022-07-24: 15 mL via OROMUCOSAL

## 2022-07-24 MED ORDER — PROPOFOL 500 MG/50ML IV EMUL
INTRAVENOUS | Status: DC | PRN
  Administered 2022-07-24: 100 ug/kg/min via INTRAVENOUS

## 2022-07-24 MED ORDER — FUROSEMIDE 20 MG PO TABS
20.0000 mg | ORAL_TABLET | Freq: Every day | ORAL | Status: DC
  Administered 2022-07-24 – 2022-07-26 (×3): 20 mg via ORAL
  Filled 2022-07-24 (×3): qty 1

## 2022-07-24 MED ORDER — TRANEXAMIC ACID-NACL 1000-0.7 MG/100ML-% IV SOLN
1000.0000 mg | INTRAVENOUS | Status: AC
  Administered 2022-07-24: 1000 mg via INTRAVENOUS
  Filled 2022-07-24: qty 100

## 2022-07-24 MED ORDER — OXYCODONE HCL 5 MG PO TABS
5.0000 mg | ORAL_TABLET | ORAL | Status: DC | PRN
  Administered 2022-07-24 – 2022-07-26 (×9): 10 mg via ORAL
  Filled 2022-07-24 (×9): qty 2

## 2022-07-24 MED ORDER — PHENYLEPHRINE HCL-NACL 20-0.9 MG/250ML-% IV SOLN
INTRAVENOUS | Status: AC
  Filled 2022-07-24: qty 250

## 2022-07-24 MED ORDER — HYDROMORPHONE HCL 2 MG PO TABS
2.0000 mg | ORAL_TABLET | ORAL | Status: DC | PRN
  Administered 2022-07-24 – 2022-07-26 (×7): 2 mg via ORAL
  Filled 2022-07-24 (×7): qty 1

## 2022-07-24 MED ORDER — ROPIVACAINE HCL 5 MG/ML IJ SOLN
INTRAMUSCULAR | Status: DC | PRN
  Administered 2022-07-24: 20 mL via PERINEURAL

## 2022-07-24 MED ORDER — BUPIVACAINE IN DEXTROSE 0.75-8.25 % IT SOLN
INTRATHECAL | Status: DC | PRN
  Administered 2022-07-24: 1.6 mL via INTRATHECAL

## 2022-07-24 MED ORDER — POVIDONE-IODINE 10 % EX SWAB
2.0000 | Freq: Once | CUTANEOUS | Status: AC
  Administered 2022-07-24: 2 via TOPICAL

## 2022-07-24 MED ORDER — PROPOFOL 1000 MG/100ML IV EMUL
INTRAVENOUS | Status: AC
  Filled 2022-07-24: qty 100

## 2022-07-24 MED ORDER — PROPOFOL 500 MG/50ML IV EMUL
INTRAVENOUS | Status: AC
  Filled 2022-07-24: qty 50

## 2022-07-24 MED ORDER — POLYETHYLENE GLYCOL 3350 17 G PO PACK: 17.0000 g | PACK | Freq: Every day | ORAL | Status: DC | PRN

## 2022-07-24 MED ORDER — AMLODIPINE BESYLATE 5 MG PO TABS
5.0000 mg | ORAL_TABLET | Freq: Every day | ORAL | Status: DC
  Administered 2022-07-25 – 2022-07-26 (×2): 5 mg via ORAL
  Filled 2022-07-24 (×2): qty 1

## 2022-07-24 MED ORDER — PROMETHAZINE HCL 25 MG/ML IJ SOLN: 6.2500 mg | INTRAMUSCULAR | Status: DC | PRN

## 2022-07-24 MED ORDER — CEFAZOLIN SODIUM-DEXTROSE 2-4 GM/100ML-% IV SOLN
2.0000 g | INTRAVENOUS | Status: AC
  Administered 2022-07-24: 2 g via INTRAVENOUS
  Filled 2022-07-24: qty 100

## 2022-07-24 MED ORDER — BUPIVACAINE-EPINEPHRINE 0.25% -1:200000 IJ SOLN
INTRAMUSCULAR | Status: DC | PRN
  Administered 2022-07-24: 30 mL

## 2022-07-24 MED ORDER — ASPIRIN 81 MG PO CHEW
81.0000 mg | CHEWABLE_TABLET | Freq: Two times a day (BID) | ORAL | Status: DC
  Administered 2022-07-24 – 2022-07-26 (×4): 81 mg via ORAL
  Filled 2022-07-24 (×4): qty 1

## 2022-07-24 MED ORDER — FENTANYL CITRATE (PF) 100 MCG/2ML IJ SOLN
INTRAMUSCULAR | Status: DC | PRN
  Administered 2022-07-24: 100 ug via INTRAVENOUS

## 2022-07-24 MED ORDER — SODIUM CHLORIDE 0.9 % IV SOLN: INTRAVENOUS | Status: DC

## 2022-07-24 MED ORDER — METOCLOPRAMIDE HCL 5 MG PO TABS: 5.0000 mg | ORAL_TABLET | Freq: Three times a day (TID) | ORAL | Status: DC | PRN

## 2022-07-24 MED ORDER — ONDANSETRON HCL 4 MG/2ML IJ SOLN
INTRAMUSCULAR | Status: DC | PRN
  Administered 2022-07-24: 4 mg via INTRAVENOUS

## 2022-07-24 SURGICAL SUPPLY — 63 items
BAG COUNTER SPONGE SURGICOUNT (BAG) IMPLANT
BAG SPEC THK2 15X12 ZIP CLS (MISCELLANEOUS) ×1
BAG SPNG CNTER NS LX DISP (BAG)
BAG ZIPLOCK 12X15 (MISCELLANEOUS) ×1 IMPLANT
BENZOIN TINCTURE PRP APPL 2/3 (GAUZE/BANDAGES/DRESSINGS) IMPLANT
BLADE SAG 18X100X1.27 (BLADE) ×1 IMPLANT
BLADE SURG SZ10 CARB STEEL (BLADE) ×2 IMPLANT
BNDG ELASTIC 6X5.8 VLCR STR LF (GAUZE/BANDAGES/DRESSINGS) ×2 IMPLANT
BOWL SMART MIX CTS (DISPOSABLE) IMPLANT
BSPLAT TIB 5D E CMNT STM LT (Knees) ×1 IMPLANT
CEMENT BONE R 1X40 (Cement) IMPLANT
CEMENT BONE SIMPLEX SPEEDSET (Cement) IMPLANT
COMP PATELLAR 29 STD 8 THK (Orthopedic Implant) IMPLANT
COOLER ICEMAN CLASSIC (MISCELLANEOUS) ×1 IMPLANT
COVER SURGICAL LIGHT HANDLE (MISCELLANEOUS) ×1 IMPLANT
CUFF TOURN SGL QUICK 34 (TOURNIQUET CUFF) ×1
CUFF TRNQT CYL 34X4.125X (TOURNIQUET CUFF) ×1 IMPLANT
DRAPE INCISE IOBAN 66X45 STRL (DRAPES) ×1 IMPLANT
DRAPE U-SHAPE 47X51 STRL (DRAPES) ×1 IMPLANT
DURAPREP 26ML APPLICATOR (WOUND CARE) ×1 IMPLANT
ELECT BLADE TIP CTD 4 INCH (ELECTRODE) ×1 IMPLANT
ELECT REM PT RETURN 15FT ADLT (MISCELLANEOUS) ×1 IMPLANT
FEMUR CMT CR STD SZ 6 LT KNEE (Joint) ×1 IMPLANT
FEMUR CMTD CR STD SZ 6 LT KNEE (Joint) IMPLANT
GAUZE PAD ABD 7.5X8 STRL (GAUZE/BANDAGES/DRESSINGS) IMPLANT
GAUZE PAD ABD 8X10 STRL (GAUZE/BANDAGES/DRESSINGS) ×2 IMPLANT
GAUZE SPONGE 4X4 12PLY STRL (GAUZE/BANDAGES/DRESSINGS) ×1 IMPLANT
GAUZE XEROFORM 1X8 LF (GAUZE/BANDAGES/DRESSINGS) IMPLANT
GLOVE BIO SURGEON STRL SZ7.5 (GLOVE) ×1 IMPLANT
GLOVE BIOGEL PI IND STRL 8 (GLOVE) ×2 IMPLANT
GLOVE ECLIPSE 8.0 STRL XLNG CF (GLOVE) ×1 IMPLANT
GOWN STRL REUS W/ TWL XL LVL3 (GOWN DISPOSABLE) ×2 IMPLANT
GOWN STRL REUS W/TWL XL LVL3 (GOWN DISPOSABLE) ×2
HANDPIECE INTERPULSE COAX TIP (DISPOSABLE) ×1
HDLS TROCR DRIL PIN KNEE 75 (PIN) ×1
HOLDER FOLEY CATH W/STRAP (MISCELLANEOUS) IMPLANT
IMMOBILIZER KNEE 20 (SOFTGOODS)
IMMOBILIZER KNEE 20 THIGH 36 (SOFTGOODS) ×1 IMPLANT
KIT TURNOVER KIT A (KITS) IMPLANT
NS IRRIG 1000ML POUR BTL (IV SOLUTION) ×1 IMPLANT
PACK TOTAL KNEE CUSTOM (KITS) ×1 IMPLANT
PAD COLD SHLDR WRAP-ON (PAD) ×1 IMPLANT
PADDING CAST COTTON 6X4 STRL (CAST SUPPLIES) ×2 IMPLANT
PATELLA ZIMMER 29MM (Orthopedic Implant) ×1 IMPLANT
PIN DRILL HDLS TROCAR 75 4PK (PIN) IMPLANT
PROTECTOR NERVE ULNAR (MISCELLANEOUS) ×1 IMPLANT
SCREW FEMALE HEX FIX 25X2.5 (ORTHOPEDIC DISPOSABLE SUPPLIES) IMPLANT
SET HNDPC FAN SPRY TIP SCT (DISPOSABLE) ×1 IMPLANT
SET PAD KNEE POSITIONER (MISCELLANEOUS) ×1 IMPLANT
SPIKE FLUID TRANSFER (MISCELLANEOUS) IMPLANT
STAPLER VISISTAT 35W (STAPLE) IMPLANT
STEM TIBIA 5 DEG SZ E L KNEE (Knees) IMPLANT
STEM TIBIAL SZ6-7 EF 14 LT (Joint) IMPLANT
STRIP CLOSURE SKIN 1/2X4 (GAUZE/BANDAGES/DRESSINGS) IMPLANT
SUT MNCRL AB 4-0 PS2 18 (SUTURE) IMPLANT
SUT VIC AB 0 CT1 27 (SUTURE) ×1
SUT VIC AB 0 CT1 27XBRD ANTBC (SUTURE) ×1 IMPLANT
SUT VIC AB 1 CT1 36 (SUTURE) ×2 IMPLANT
SUT VIC AB 2-0 CT1 27 (SUTURE) ×2
SUT VIC AB 2-0 CT1 TAPERPNT 27 (SUTURE) ×2 IMPLANT
TIBIA STEM 5 DEG SZ E L KNEE (Knees) ×1 IMPLANT
TRAY FOLEY MTR SLVR 16FR STAT (SET/KITS/TRAYS/PACK) IMPLANT
WATER STERILE IRR 1000ML POUR (IV SOLUTION) ×2 IMPLANT

## 2022-07-24 NOTE — Anesthesia Postprocedure Evaluation (Signed)
Anesthesia Post Note  Patient: Krystal Kane  Procedure(s) Performed: LEFT TOTAL KNEE ARTHROPLASTY (Left: Knee)     Patient location during evaluation: PACU Anesthesia Type: Regional Level of consciousness: awake Pain management: pain level controlled Vital Signs Assessment: post-procedure vital signs reviewed and stable Respiratory status: spontaneous breathing, respiratory function stable and nonlabored ventilation Cardiovascular status: blood pressure returned to baseline and stable Postop Assessment: no headache, no backache and no apparent nausea or vomiting Anesthetic complications: no   No notable events documented.  Last Vitals:  Vitals:   07/24/22 0930 07/24/22 0945  BP: 122/79 126/89  Pulse: 71 73  Resp: 14 12  Temp:    SpO2: 99% 100%    Last Pain:  Vitals:   07/24/22 0930  TempSrc:   PainSc: 0-No pain                 Nilda Simmer

## 2022-07-24 NOTE — Op Note (Signed)
Operative note  Date of operation: 07/24/2022 Preoperative diagnosis: Primary osteoarthritis left knee Postoperative diagnosis: Same  Procedure: Left cemented total knee arthroplasty  Implants: Biomet/Zimmer persona knee system with size 6 standard left CR femur, size E left tibial tray, 14 mm thickness left medial congruent fixed-bearing polythene insert, 29 mm patella button  Surgeon: Lind Guest. Ninfa Linden, MD Assistant: Benita Stabile, PA-C  Anesthesia: #1 left lower extremity adductor canal block, #2 spinal, #3 local EBL: Less than 100 cc Tourniquet time: Less than 1 hour Antibiotics: 2 g IV Ancef Complications: None  Indications: The patient is a 62 year old female well-known to me.  She has debilitating arthritis of her left knee this been well-documented.  She has tried and failed conservative treatment for the knee over a year now.  Her x-rays show bone-on-bone wear of the knee as well.  At this point her left knee pain is detrimentally affecting her mobility, her quality of life and the activities of daily living to the point she wishes to proceed with a total knee arthroplasty.  She has remote history of a right knee replacement that is done well.  Having had this done before she is fully aware of the risk of acute blood loss anemia, nerve or vessel injury, fracture, infection, DVT, implant failure and wound healing issues.  She understands her goals are hopefully decrease pain, improve mobility, and improve quality of life.  Procedure description: After informed consent was obtained and the appropriate left knee was marked, anesthesia obtained a left lower extremity adductor canal block in the holding room and the patient was brought to the operating room and set up on the operating table where spinal anesthesia was obtained.  She was then laid in supine position on the operating table and a Foley catheter was placed.  A nonsterile tourniquet was placed around her upper left thigh and  her left thigh, knee, leg and ankle were prepped and draped with DuraPrep and sterile drapes including a sterile stockinette.  A timeout was called and she was identified as the correct patient and the correct left knee.  An Esmarch was then used to wrap out the leg and the tourniquet was plated to 300 mm of pressure.  With the knee extended a midline incision was made over the patella and carried proximally distally.  A medial parapatellar arthrotomy was made and a moderate joint effusion was encountered.  With the knee in a flexed position we found significant cartilage wear throughout the knee.  We removed osteophytes in all 3 compartments as well as remnants of the medial lateral meniscus and ACL.  With the knee in a flexed position we used the extramedullary cutting guide for making her proximal tibia cut correction varus and valgus and taking 2 mm off the low side.  We also built in a 3 degree slope.  We made this cut without difficulty and backed it down 2 more millimeters.  We then went to the femur and used a intramedullary guide for distal femoral cut setting this for a left knee at 5 degrees externally rotated.  We made echo without difficulty and brought the knee back down to full extension and she actually slightly hyperextended with a 10 mm extension block.  We then back to the femur and put a femoral sizing guide based off the epicondylar axis.  Based off of this we chose a size 6 femur.  We put a 4-in-1 cutting block for size 6 femur and made her anterior and posterior cuts  followed by her chamfer cuts.  Attention was then turned back to the tibia and we chose a size E left tibial tray for coverage over the tibial plateau so the rotation of the tibial tubercle and the femur.  We made a keel punch and drill hole off of this.  With our size E trial left tibia we trialed a size 6 left CR standard femur.  We went up to a 14 mm medial congruent polythene insert and we are pleased with range of motion and  stability without insert.  We also made a patella cut and drilled 3 holes for size 29 patella button.  With all transportation the knee again we put her through several cycles of motion we are pleased with range of motion and stability.  All trial instrumentation was then removed.  The knee was irrigated with normal saline solution using pulsatile lavage.  Marcaine with epinephrine was then placed around the arthrotomy.  The cement was mixed in with the knee in a flexed position we cemented our Biomet Zimmer tibial tray for left knee with a persona knee system size E.  We cemented our size 6 left CR standard femur.  We cemented our size 29 patella button and placed our 14 mm medial congruent left polythene insert.  We then held the knee fully extended and compressed for the cement to harden.  Once it hardened we put her through several cycles of motion again and I was pleased with range of motion and stability.  The tourniquet was let down and hemostasis was obtained with electrocautery.  The arthrotomy was closed with interrupted #1 Vicryl suture followed by 0 Vicryl close deep tissue and 2-0 Vicryl close subcu tissue.  The skin was closed with staples.  Well-padded sterile dressings applied.  The patient was taken recovery in stable addition with all final counts being correct and no complications noted.  Benita Stabile, PA-C did assist the entire case and beginning to end and his assistance was crucial medically necessary for soft tissue retraction and management, helping guide implant placement and a layered closure of the wound.

## 2022-07-24 NOTE — Transfer of Care (Signed)
Immediate Anesthesia Transfer of Care Note  Patient: Krystal Kane  Procedure(s) Performed: LEFT TOTAL KNEE ARTHROPLASTY (Left: Knee)  Patient Location: PACU  Anesthesia Type:Spinal  Level of Consciousness: sedated  Airway & Oxygen Therapy: Patient Spontanous Breathing and Patient connected to face mask oxygen  Post-op Assessment: Report given to RN and Post -op Vital signs reviewed and stable  Post vital signs: Reviewed and stable  Last Vitals:  Vitals Value Taken Time  BP 112/68 07/24/22 0912  Temp    Pulse 84 07/24/22 0914  Resp 19 07/24/22 0914  SpO2 100 % 07/24/22 0914  Vitals shown include unvalidated device data.  Last Pain:  Vitals:   07/24/22 0559  TempSrc: Oral  PainSc:          Complications: No notable events documented.

## 2022-07-24 NOTE — Evaluation (Signed)
Physical Therapy Evaluation Patient Details Name: Krystal Kane MRN: [id number] DOB: [birth year] Today's Date: 07/24/2022  History of Present Illness  Pt is a 62 year old female s/p L TKA with hx of R TKA and breast cancer  Clinical Impression  Pt is s/p TKA resulting in the deficits listed below (see PT Problem List).  Pt will benefit from skilled PT to increase their independence and safety with mobility to allow discharge to the venue listed below.   Pt eager to mobilize and assisted with ambulating in hallway short distance POD #0.  Pt educated to perform ankle pumps and use incentive spirometer.  Pt has a few steps to enter home and will need to practice prior to d/c.        Recommendations for follow up therapy are one component of a multi-disciplinary discharge planning process, led by the attending physician.  Recommendations may be updated based on patient status, additional functional criteria and insurance authorization.  Follow Up Recommendations Follow physician's recommendations for discharge plan and follow up therapies      Assistance Recommended at Discharge PRN  Patient can return home with the following  Help with stairs or ramp for entrance    Equipment Recommendations Rolling walker (2 wheels)  Recommendations for Other Services       Functional Status Assessment Patient has had a recent decline in their functional status and demonstrates the ability to make significant improvements in function in a reasonable and predictable amount of time.     Precautions / Restrictions Precautions Precautions: Fall;Knee Restrictions Weight Bearing Restrictions: No Other Position/Activity Restrictions: WBAT      Mobility  Bed Mobility Overal bed mobility: Needs Assistance Bed Mobility: Supine to Sit     Supine to sit: Min guard, HOB elevated     General bed mobility comments: cues for self assist    Transfers Overall transfer level: Needs assistance Equipment  used: Rolling walker (2 wheels) Transfers: Sit to/from Stand Sit to Stand: Min guard           General transfer comment: verbal cues for UE and LE positioning    Ambulation/Gait Ambulation/Gait assistance: Min guard Gait Distance (Feet): 80 Feet Assistive device: Rolling walker (2 wheels) Gait Pattern/deviations: Step-to pattern, Step-through pattern, Decreased stride length, Antalgic Gait velocity: decr     General Gait Details: verbal cues for sequence, RW positioning, step length  Stairs            Wheelchair Mobility    Modified Rankin (Stroke Patients Only)       Balance                                             Pertinent Vitals/Pain Pain Assessment Pain Assessment: 0-10 Pain Score: 3  Pain Location: left knee Pain Descriptors / Indicators: Aching, Sore Pain Intervention(s): Repositioned, Monitored during session, Patient requesting pain meds-RN notified    Home Living Family/patient expects to be discharged to:: Private residence Living Arrangements: Spouse/significant other   Type of Home: House Home Access: Stairs to enter Entrance Stairs-Rails: Right;Left;Can reach both Technical brewer of Steps: 5   Home Layout: One level Home Equipment: None      Prior Function Prior Level of Function : Independent/Modified Independent                     Hand Dominance  Extremity/Trunk Assessment        Lower Extremity Assessment Lower Extremity Assessment: LLE deficits/detail LLE Deficits / Details: able to perform SLR, bil ankle pumps, observed approx 50* knee flexion sitting EOB       Communication   Communication: No difficulties  Cognition Arousal/Alertness: Awake/alert Behavior During Therapy: WFL for tasks assessed/performed Overall Cognitive Status: Within Functional Limits for tasks assessed                                          General Comments      Exercises      Assessment/Plan    PT Assessment Patient needs continued PT services  PT Problem List Decreased strength;Decreased activity tolerance;Decreased mobility;Decreased knowledge of precautions;Decreased knowledge of use of DME;Decreased range of motion;Pain       PT Treatment Interventions Stair training;Gait training;DME instruction;Therapeutic exercise;Functional mobility training;Therapeutic activities;Patient/family education    PT Goals (Current goals can be found in the Care Plan section)  Acute Rehab PT Goals PT Goal Formulation: With patient Time For Goal Achievement: 07/31/22 Potential to Achieve Goals: Good    Frequency 7X/week     Co-evaluation               AM-PAC PT "6 Clicks" Mobility  Outcome Measure Help needed turning from your back to your side while in a flat bed without using bedrails?: A Little Help needed moving from lying on your back to sitting on the side of a flat bed without using bedrails?: A Little Help needed moving to and from a bed to a chair (including a wheelchair)?: A Little Help needed standing up from a chair using your arms (e.g., wheelchair or bedside chair)?: A Little Help needed to walk in hospital room?: A Little Help needed climbing 3-5 steps with a railing? : A Lot 6 Click Score: 17    End of Session Equipment Utilized During Treatment: Gait belt Activity Tolerance: Patient tolerated treatment well Patient left: in chair;with call bell/phone within reach;with family/visitor present (pt aware to call for assist out of recliner, spouse present) Nurse Communication: Mobility status;Patient requests pain meds PT Visit Diagnosis: Difficulty in walking, not elsewhere classified (R26.2)    Time: 1423-1440 PT Time Calculation (min) (ACUTE ONLY): 17 min   Charges:   PT Evaluation $PT Eval Low Complexity: 1 Low        Kati PT, DPT Physical Therapist Acute Rehabilitation Services Preferred contact method: Secure Chat Weekend  Pager Only: [phone] Office: [place] 07/24/2022, 4:09 PM

## 2022-07-24 NOTE — Anesthesia Procedure Notes (Signed)
Spinal  Patient location during procedure: OR Start time: 07/24/2022 7:23 AM End time: 07/24/2022 7:25 AM Reason for block: surgical anesthesia Staffing Performed: resident/CRNA  Resident/CRNA: Lind Covert, CRNA Performed by: Lind Covert, CRNA Authorized by: Nilda Simmer, MD   Preanesthetic Checklist Completed: patient identified, IV checked, site marked, risks and benefits discussed, surgical consent, monitors and equipment checked, pre-op evaluation and timeout performed Spinal Block Patient position: sitting Prep: DuraPrep Patient monitoring: heart rate, cardiac monitor, continuous pulse ox and blood pressure Approach: midline Location: L3-4 Injection technique: single-shot Needle Needle type: Pencan  Needle gauge: 24 G Needle length: 10 cm Needle insertion depth: 8 cm Assessment Sensory level: T6 Events: CSF return Additional Notes Timeout performed. Patient in sitting position. Dr. Zenia Resides here. L3-4 identified. Cleansed with Duraprep.SAB without difficulty. To supine position

## 2022-07-24 NOTE — TOC Transition Note (Signed)
Transition of Care [facility]) - CM/SW Discharge Note   Patient Details  Name: Krystal Kane MRN: [id number] Date of Birth: Sep 17, 1960  [facility] CM/SW Contact:  Lennart Pall, LCSW Phone Number: 07/24/2022, 1:09 PM   Clinical Narrative:    Met with pt and confirming she has needed DME at home.  HHPT prearranged with [facility] via MD office.  No further TOC needs.   Final next level of care: [place] Barriers to Discharge: No Barriers Identified   Patient Goals and CMS Choice      Discharge Placement                         Discharge Plan and Services Additional resources added to the After Visit Summary for                  DME Arranged: N/A DME Agency: NA       HH Arranged: PT HH Agency: [facility]        Social Determinants of Health (SDOH) Interventions SDOH Screenings   Tobacco Use: Medium Risk (07/24/2022)     Readmission Risk Interventions     No data to display

## 2022-07-24 NOTE — Interval H&P Note (Signed)
History and Physical Interval Note: Patient understands that she is here today for a left knee replacement to treat her severe left knee arthritis.  There has been no acute or interval change in her medical status.  See H&P.  The risks and benefits of surgery have been explained in detail and informed consent is obtained.  The left operative knee has been marked.  07/24/2022 7:03 AM  Krystal Kane  has presented today for surgery, with the diagnosis of osteoarthritis left knee.  The various methods of treatment have been discussed with the patient and family. After consideration of risks, benefits and other options for treatment, the patient has consented to  Procedure(s): LEFT TOTAL KNEE ARTHROPLASTY (Left) as a surgical intervention.  The patient's history has been reviewed, patient examined, no change in status, stable for surgery.  I have reviewed the patient's chart and labs.  Questions were answered to the patient's satisfaction.     Mcarthur Rossetti

## 2022-07-24 NOTE — Anesthesia Procedure Notes (Signed)
Anesthesia Regional Block: Adductor canal block   Pre-Anesthetic Checklist: , timeout performed,  Correct Patient, Correct Site, Correct Laterality,  Correct Procedure, Correct Position, site marked,  Risks and benefits discussed,  Surgical consent,  Pre-op evaluation,  At surgeon's request and post-op pain management  Laterality: Left  Prep: chloraprep       Needles:  Injection technique: Single-shot  Needle Type: Echogenic Stimulator Needle     Needle Length: 9cm  Needle Gauge: 21     Additional Needles:   Procedures:,,,, ultrasound used (permanent image in chart),,    Narrative:  Start time: 07/24/2022 6:52 AM End time: 07/24/2022 6:55 AM Injection made incrementally with aspirations every 5 mL.  Performed by: Personally  Anesthesiologist: Nilda Simmer, MD  Additional Notes: Discussed risks and benefits of nerve block including, but not limited to, prolonged and/or permanent nerve injury involving sensory and/or motor function. Monitors were applied and a time-out was performed. The nerve and associated structures were visualized under ultrasound guidance. After negative aspiration, local anesthetic was slowly injected around the nerve. There was no evidence of high pressure during the procedure. There were no paresthesias. VSS remained stable and the patient tolerated the procedure well.

## 2022-07-25 LAB — CBC
HCT: 34.8 % — ABNORMAL LOW (ref 36.0–46.0)
Hemoglobin: 11.1 g/dL — ABNORMAL LOW (ref 12.0–15.0)
MCH: 33.1 pg (ref 26.0–34.0)
MCHC: 31.9 g/dL (ref 30.0–36.0)
MCV: 103.9 fL — ABNORMAL HIGH (ref 80.0–100.0)
Platelets: 249 10*3/uL (ref 150–400)
RBC: 3.35 MIL/uL — ABNORMAL LOW (ref 3.87–5.11)
RDW: 13 % (ref 11.5–15.5)
WBC: 15.9 10*3/uL — ABNORMAL HIGH (ref 4.0–10.5)
nRBC: 0 % (ref 0.0–0.2)

## 2022-07-25 LAB — BASIC METABOLIC PANEL
Anion gap: 4 — ABNORMAL LOW (ref 5–15)
BUN: 11 mg/dL (ref 6–20)
CO2: 27 mmol/L (ref 22–32)
Calcium: 8.4 mg/dL — ABNORMAL LOW (ref 8.9–10.3)
Chloride: 107 mmol/L (ref 98–111)
Creatinine, Ser: 0.61 mg/dL (ref 0.44–1.00)
GFR, Estimated: >60 mL/min (ref >60)
Glucose, Bld: 133 mg/dL — ABNORMAL HIGH (ref 70–99)
Potassium: 4.2 mmol/L (ref 3.5–5.1)
Sodium: 138 mmol/L (ref 135–145)

## 2022-07-25 MED ORDER — KETOROLAC TROMETHAMINE 15 MG/ML IJ SOLN
7.5000 mg | Freq: Four times a day (QID) | INTRAMUSCULAR | Status: AC
  Administered 2022-07-25 – 2022-07-26 (×4): 7.5 mg via INTRAVENOUS
  Filled 2022-07-25 (×4): qty 1

## 2022-07-25 MED ORDER — TIZANIDINE HCL 4 MG PO TABS: 4.0000 mg | ORAL_TABLET | Freq: Four times a day (QID) | ORAL | 0 refills | Status: DC | PRN

## 2022-07-25 MED ORDER — TIZANIDINE HCL 4 MG PO TABS
4.0000 mg | ORAL_TABLET | Freq: Four times a day (QID) | ORAL | Status: DC | PRN
  Administered 2022-07-25 – 2022-07-26 (×3): 4 mg via ORAL
  Filled 2022-07-25 (×4): qty 1

## 2022-07-25 MED ORDER — HYDROMORPHONE HCL 4 MG PO TABS: 4.0000 mg | ORAL_TABLET | ORAL | 0 refills | Status: DC | PRN

## 2022-07-25 MED ORDER — GABAPENTIN 100 MG PO CAPS
100.0000 mg | ORAL_CAPSULE | Freq: Three times a day (TID) | ORAL | Status: DC
  Administered 2022-07-25 – 2022-07-26 (×4): 100 mg via ORAL
  Filled 2022-07-25 (×4): qty 1

## 2022-07-25 MED ORDER — ASPIRIN 81 MG PO CHEW: 81.0000 mg | CHEWABLE_TABLET | Freq: Two times a day (BID) | ORAL | 0 refills | Status: DC

## 2022-07-25 NOTE — Progress Notes (Signed)
Physical Therapy Treatment Patient Details Name: Krystal Kane MRN: [id number] DOB: 1960/10/19 Today's Date: 07/25/2022   History of Present Illness Pt is a 62 year old female s/p L TKA with hx of R TKA and breast cancer    PT Comments    Pt with increased pain this morning and pain meds adjusted.  Pt finally feeling more comfortable yet agreeable to mobilize.  Pt reports improvement with knee stiffness with ambulating.      Recommendations for follow up therapy are one component of a multi-disciplinary discharge planning process, led by the attending physician.  Recommendations may be updated based on patient status, additional functional criteria and insurance authorization.  Follow Up Recommendations  Follow physician's recommendations for discharge plan and follow up therapies     Assistance Recommended at Discharge PRN  Patient can return home with the following Help with stairs or ramp for entrance   Equipment Recommendations  Rolling walker (2 wheels)    Recommendations for Other Services       Precautions / Restrictions Precautions Precautions: Fall;Knee Restrictions Other Position/Activity Restrictions: WBAT     Mobility  Bed Mobility Overal bed mobility: Needs Assistance Bed Mobility: Supine to Sit, Sit to Supine     Supine to sit: Min guard, HOB elevated Sit to supine: Min guard, HOB elevated   General bed mobility comments: cues for self assist    Transfers Overall transfer level: Needs assistance Equipment used: Rolling walker (2 wheels) Transfers: Sit to/from Stand Sit to Stand: Min guard           General transfer comment: verbal cues for UE and LE positioning    Ambulation/Gait Ambulation/Gait assistance: Min guard Gait Distance (Feet): 140 Feet Assistive device: Rolling walker (2 wheels) Gait Pattern/deviations: Step-to pattern, Step-through pattern, Decreased stride length, Antalgic Gait velocity: decr     General Gait Details: verbal  cues for sequence, RW positioning, step length   Stairs             Wheelchair Mobility    Modified Rankin (Stroke Patients Only)       Balance                                            Cognition Arousal/Alertness: Awake/alert Behavior During Therapy: WFL for tasks assessed/performed Overall Cognitive Status: Within Functional Limits for tasks assessed                                          Exercises      General Comments        Pertinent Vitals/Pain Pain Assessment Pain Assessment: 0-10 Pain Score: 5  Pain Location: left knee Pain Descriptors / Indicators: Aching, Sore Pain Intervention(s): Monitored during session, Premedicated before session, Repositioned    Home Living                          Prior Function            PT Goals (current goals can now be found in the care plan section) Progress towards PT goals: Progressing toward goals    Frequency    7X/week      PT Plan Current plan remains appropriate    Co-evaluation  AM-PAC PT "6 Clicks" Mobility   Outcome Measure  Help needed turning from your back to your side while in a flat bed without using bedrails?: A Little Help needed moving from lying on your back to sitting on the side of a flat bed without using bedrails?: A Little Help needed moving to and from a bed to a chair (including a wheelchair)?: A Little Help needed standing up from a chair using your arms (e.g., wheelchair or bedside chair)?: A Little Help needed to walk in hospital room?: A Little Help needed climbing 3-5 steps with a railing? : A Lot 6 Click Score: 17    End of Session Equipment Utilized During Treatment: Gait belt Activity Tolerance: Patient tolerated treatment well Patient left: in bed;with call bell/phone within reach;with family/visitor present   PT Visit Diagnosis: Difficulty in walking, not elsewhere classified (R26.2)      Time: 6546-5035 PT Time Calculation (min) (ACUTE ONLY): 13 min  Charges:  $Gait Training: 8-22 mins                    Arlyce Dice, DPT Physical Therapist Acute Rehabilitation Services Preferred contact method: Secure Chat Weekend Pager Only: [phone] Office: [place] 07/25/2022, 2:22 PM

## 2022-07-25 NOTE — Progress Notes (Signed)
Subjective: 1 Day Post-Op Procedure(s) (LRB): LEFT TOTAL KNEE ARTHROPLASTY (Left) Patient reports pain as severe.    Objective: Vital signs in last 24 hours: Temp:  [98 F (36.7 C)-98.8 F (37.1 C)] 98.2 F (36.8 C) (02/03 0743) Pulse Rate:  [69-79] 77 (02/03 0743) Resp:  [14-18] 18 (02/03 0743) BP: (110-133)/(72-83) 133/78 (02/03 0743) SpO2:  [96 %-99 %] 96 % (02/03 0743)  Intake/Output from previous day: 02/02 0701 - 02/03 0700 In: 2632.6 [P.O.:400; I.V.:2077.6; IV Piggyback:155] Out: 2855 [Urine:2825; Blood:30] Intake/Output this shift: Total I/O In: 300 [P.O.:300] Out: 300 [Urine:300]  Recent Labs    07/25/22 0335  HGB 11.1*   Recent Labs    07/25/22 0335  WBC 15.9*  RBC 3.35*  HCT 34.8*  PLT 249   Recent Labs    07/25/22 0335  NA 138  K 4.2  CL 107  CO2 27  BUN 11  CREATININE 0.61  GLUCOSE 133*  CALCIUM 8.4*   No results for input(s): "LABPT", "INR" in the last 72 hours.  Sensation intact distally Intact pulses distally Dorsiflexion/Plantar flexion intact Incision: dressing C/D/I No cellulitis present Compartment soft   Assessment/Plan: 1 Day Post-Op Procedure(s) (LRB): LEFT TOTAL KNEE ARTHROPLASTY (Left) Up with therapy Plan for discharge tomorrow Discharge home with home health      Mcarthur Rossetti 07/25/2022, 11:01 AM

## 2022-07-25 NOTE — Progress Notes (Signed)
Physical Therapy Treatment Patient Details Name: Krystal Kane MRN: [id number] DOB: 07/05/1960 Today's Date: 07/25/2022   History of Present Illness Pt is a 62 year old female s/p L TKA with hx of R TKA and breast cancer    PT Comments    Pt premedicated and assisted to bathroom.  Pt able to ambulate in hallway however reports 7/10 knee pain.  Pt assisted back to bed with ice to incision and warm packs to thigh area per request.  Pt declined exercises at this time due to pain however pt verbally educated to perform 10 repetitions knee extension and flexion later today if pain improves or knee feeling stiff however maintain knee extension at rest.  Anticipate d/c home possibly tomorrow.   Recommendations for follow up therapy are one component of a multi-disciplinary discharge planning process, led by the attending physician.  Recommendations may be updated based on patient status, additional functional criteria and insurance authorization.  Follow Up Recommendations  Follow physician's recommendations for discharge plan and follow up therapies     Assistance Recommended at Discharge PRN  Patient can return home with the following Help with stairs or ramp for entrance   Equipment Recommendations  Rolling walker (2 wheels)    Recommendations for Other Services       Precautions / Restrictions Precautions Precautions: Fall;Knee Restrictions Other Position/Activity Restrictions: WBAT     Mobility  Bed Mobility Overal bed mobility: Needs Assistance Bed Mobility: Supine to Sit, Sit to Supine     Supine to sit: Min guard, HOB elevated Sit to supine: Min guard, HOB elevated   General bed mobility comments: utilized gait belt to self assist    Transfers Overall transfer level: Needs assistance Equipment used: Rolling walker (2 wheels) Transfers: Sit to/from Stand Sit to Stand: Min guard           General transfer comment: verbal cues for UE and LE positioning     Ambulation/Gait Ambulation/Gait assistance: Min guard Gait Distance (Feet): 100 Feet Assistive device: Rolling walker (2 wheels) Gait Pattern/deviations: Step-through pattern, Decreased stride length, Antalgic Gait velocity: decr     General Gait Details: verbal cues for sequence, RW positioning, step length   Stairs             Wheelchair Mobility    Modified Rankin (Stroke Patients Only)       Balance                                            Cognition Arousal/Alertness: Awake/alert Behavior During Therapy: WFL for tasks assessed/performed Overall Cognitive Status: Within Functional Limits for tasks assessed                                          Exercises      General Comments        Pertinent Vitals/Pain Pain Assessment Pain Assessment: 0-10 Pain Score: 7  Pain Location: left knee Pain Descriptors / Indicators: Aching, Sore Pain Intervention(s): Repositioned, Monitored during session    Home Living                          Prior Function            PT Goals (current goals  can now be found in the care plan section) Progress towards PT goals: Progressing toward goals    Frequency    7X/week      PT Plan Current plan remains appropriate    Co-evaluation              AM-PAC PT "6 Clicks" Mobility   Outcome Measure  Help needed turning from your back to your side while in a flat bed without using bedrails?: A Little Help needed moving from lying on your back to sitting on the side of a flat bed without using bedrails?: A Little Help needed moving to and from a bed to a chair (including a wheelchair)?: A Little Help needed standing up from a chair using your arms (e.g., wheelchair or bedside chair)?: A Little Help needed to walk in hospital room?: A Little Help needed climbing 3-5 steps with a railing? : A Lot 6 Click Score: 17    End of Session Equipment Utilized During  Treatment: Gait belt Activity Tolerance: Patient tolerated treatment well Patient left: with call bell/phone within reach;with family/visitor present;in bed   PT Visit Diagnosis: Difficulty in walking, not elsewhere classified (R26.2)     Time: 2979-8921 PT Time Calculation (min) (ACUTE ONLY): 13 min  Charges:  $Gait Training: 8-22 mins                    Arlyce Dice, DPT Physical Therapist Acute Rehabilitation Services Preferred contact method: Secure Chat Weekend Pager Only: [phone] Office: [place] 07/25/2022, 3:02 PM

## 2022-07-25 NOTE — Discharge Instructions (Signed)

## 2022-07-26 DIAGNOSIS — I10 Essential (primary) hypertension: Secondary | ICD-10-CM | POA: Diagnosis present

## 2022-07-26 DIAGNOSIS — Z853 Personal history of malignant neoplasm of breast: Secondary | ICD-10-CM | POA: Diagnosis not present

## 2022-07-26 DIAGNOSIS — M1712 Unilateral primary osteoarthritis, left knee: Secondary | ICD-10-CM | POA: Diagnosis present

## 2022-07-26 DIAGNOSIS — Z87891 Personal history of nicotine dependence: Secondary | ICD-10-CM | POA: Diagnosis not present

## 2022-07-26 DIAGNOSIS — Z96651 Presence of right artificial knee joint: Secondary | ICD-10-CM | POA: Diagnosis present

## 2022-07-26 DIAGNOSIS — Z803 Family history of malignant neoplasm of breast: Secondary | ICD-10-CM | POA: Diagnosis not present

## 2022-07-26 DIAGNOSIS — Z8249 Family history of ischemic heart disease and other diseases of the circulatory system: Secondary | ICD-10-CM | POA: Diagnosis not present

## 2022-07-26 DIAGNOSIS — Z79899 Other long term (current) drug therapy: Secondary | ICD-10-CM | POA: Diagnosis not present

## 2022-07-26 NOTE — Plan of Care (Signed)
Pt ready to DC home with husband 

## 2022-07-26 NOTE — Progress Notes (Signed)
Physical Therapy Treatment Patient Details Name: Krystal Kane MRN: [id number] DOB: [birth year] Today's Date: 07/26/2022   History of Present Illness Pt is a 62 year old female s/p L TKA with hx of R TKA and breast cancer    PT Comments    Pt ambulating in hallway and practiced stairs supervision level.  Pt reports pain is tolerable, and she feels ready for d/c home today.  Pt had no further questions and provided with HEP handout.    Recommendations for follow up therapy are one component of a multi-disciplinary discharge planning process, led by the attending physician.  Recommendations may be updated based on patient status, additional functional criteria and insurance authorization.  Follow Up Recommendations  Follow physician's recommendations for discharge plan and follow up therapies     Assistance Recommended at Discharge PRN  Patient can return home with the following Help with stairs or ramp for entrance   Equipment Recommendations  Rolling walker (2 wheels)    Recommendations for Other Services       Precautions / Restrictions Precautions Precautions: Fall;Knee Restrictions Other Position/Activity Restrictions: WBAT     Mobility  Bed Mobility Overal bed mobility: Needs Assistance Bed Mobility: Supine to Sit     Supine to sit: HOB elevated, Supervision     General bed mobility comments: sitting EOB on arrival and departure from room    Transfers Overall transfer level: Needs assistance Equipment used: Rolling walker (2 wheels) Transfers: Sit to/from Stand Sit to Stand: Supervision           General transfer comment: verbal cues for UE and LE positioning    Ambulation/Gait Ambulation/Gait assistance: Supervision Gait Distance (Feet): 160 Feet Assistive device: Rolling walker (2 wheels) Gait Pattern/deviations: Step-through pattern, Decreased stride length, Antalgic Gait velocity: decr     General Gait Details: verbal cues for knee flexion, RW  positioning, step length   Stairs Stairs: Yes Stairs assistance: Supervision Stair Management: Step to pattern, Forwards, Two rails Number of Stairs: 2 General stair comments: pt able to recall correct sequence (had previous R TKA), performs safely, spouse observed   Wheelchair Mobility    Modified Rankin (Stroke Patients Only)       Balance                                            Cognition Arousal/Alertness: Awake/alert Behavior During Therapy: WFL for tasks assessed/performed Overall Cognitive Status: Within Functional Limits for tasks assessed                                          Exercises     General Comments        Pertinent Vitals/Pain Pain Assessment Pain Assessment: 0-10 Pain Score: 5  Pain Location: left knee Pain Descriptors / Indicators: Aching, Sore Pain Intervention(s): Monitored during session, Repositioned, Patient requesting pain meds-RN notified    Home Living                          Prior Function            PT Goals (current goals can now be found in the care plan section) Progress towards PT goals: Progressing toward goals    Frequency    7X/week  PT Plan Current plan remains appropriate    Co-evaluation              AM-PAC PT "6 Clicks" Mobility   Outcome Measure  Help needed turning from your back to your side while in a flat bed without using bedrails?: A Little Help needed moving from lying on your back to sitting on the side of a flat bed without using bedrails?: A Little Help needed moving to and from a bed to a chair (including a wheelchair)?: A Little Help needed standing up from a chair using your arms (e.g., wheelchair or bedside chair)?: A Little Help needed to walk in hospital room?: A Little Help needed climbing 3-5 steps with a railing? : A Little 6 Click Score: 18    End of Session Equipment Utilized During Treatment: Gait belt Activity  Tolerance: Patient tolerated treatment well Patient left: with call bell/phone within reach;in bed;with family/visitor present Nurse Communication: Mobility status PT Visit Diagnosis: Difficulty in walking, not elsewhere classified (R26.2)     Time: 1219-1229 PT Time Calculation (min) (ACUTE ONLY): 10 min  Charges:  $Gait Training: 8-22 mins                    Arlyce Dice, DPT Physical Therapist Acute Rehabilitation Services Preferred contact method: Secure Chat Weekend Pager Only: [phone] Office: [place] 07/26/2022, 1:18 PM

## 2022-07-26 NOTE — Progress Notes (Signed)
  Subjective: Patient stable.  Pain controlled.  Feeling much better today than yesterday.  She has had a right total knee replacement done and she is ready for discharge.   Objective: Vital signs in last 24 hours: Temp:  [97.9 F (36.6 C)-98.4 F (36.9 C)] 98.2 F (36.8 C) (02/04 0603) Pulse Rate:  [78-97] 97 (02/04 0603) Resp:  [16-18] 18 (02/04 0603) BP: (139-154)/(86-90) 150/90 (02/04 0603) SpO2:  [91 %-100 %] 91 % (02/04 0603)  Intake/Output from previous day: 02/03 0701 - 02/04 0700 In: 900 [P.O.:900] Out: 300 [Urine:300] Intake/Output this shift: No intake/output data recorded.  Exam:  Sensation intact distally Dorsiflexion/Plantar flexion intact  Labs: Recent Labs    07/25/22 0335  HGB 11.1*   Recent Labs    07/25/22 0335  WBC 15.9*  RBC 3.35*  HCT 34.8*  PLT 249   Recent Labs    07/25/22 0335  NA 138  K 4.2  CL 107  CO2 27  BUN 11  CREATININE 0.61  GLUCOSE 133*  CALCIUM 8.4*   No results for input(s): "LABPT", "INR" in the last 72 hours.  Assessment/Plan: Plan at this time is discharge to home.  She is doing well with therapy and understands the goal of rehabilitation.  Follow-up with Dr. Ninfa Linden in 10 to 14 days.   Landry Dyke Kavontae Pritchard 07/26/2022, 11:39 AM

## 2022-07-26 NOTE — Progress Notes (Signed)
Physical Therapy Treatment Patient Details Name: Krystal Kane MRN: [id number] DOB: 10/09/1960 Today's Date: 07/26/2022   History of Present Illness Pt is a 62 year old female s/p L TKA with hx of R TKA and breast cancer    PT Comments    Pt reports pain much improved today.  Pt able to ambulate in hallway and performed LE exercises.  Will return to practice stairs prior to d/c (and when spouse present).    Recommendations for follow up therapy are one component of a multi-disciplinary discharge planning process, led by the attending physician.  Recommendations may be updated based on patient status, additional functional criteria and insurance authorization.  Follow Up Recommendations  Follow physician's recommendations for discharge plan and follow up therapies     Assistance Recommended at Discharge PRN  Patient can return home with the following Help with stairs or ramp for entrance   Equipment Recommendations  Rolling walker (2 wheels)    Recommendations for Other Services       Precautions / Restrictions Precautions Precautions: Fall;Knee Restrictions Other Position/Activity Restrictions: WBAT     Mobility  Bed Mobility Overal bed mobility: Needs Assistance Bed Mobility: Supine to Sit     Supine to sit: HOB elevated, Supervision     General bed mobility comments: utilized gait belt to self assist    Transfers Overall transfer level: Needs assistance Equipment used: Rolling walker (2 wheels) Transfers: Sit to/from Stand Sit to Stand: Min guard           General transfer comment: verbal cues for UE and LE positioning    Ambulation/Gait Ambulation/Gait assistance: Min guard Gait Distance (Feet): 180 Feet Assistive device: Rolling walker (2 wheels) Gait Pattern/deviations: Step-through pattern, Decreased stride length, Antalgic Gait velocity: decr     General Gait Details: verbal cues for sequence, RW positioning, step length   Stairs              Wheelchair Mobility    Modified Rankin (Stroke Patients Only)       Balance                                            Cognition Arousal/Alertness: Awake/alert Behavior During Therapy: WFL for tasks assessed/performed Overall Cognitive Status: Within Functional Limits for tasks assessed                                          Exercises Total Joint Exercises Ankle Circles/Pumps: AROM, Both, 10 reps Quad Sets: AROM, Left, 10 reps Heel Slides: Seated, AROM, Left, 10 reps Hip ABduction/ADduction: AROM, Left, 10 reps Straight Leg Raises: AROM, Left, 10 reps    General Comments        Pertinent Vitals/Pain Pain Assessment Pain Assessment: 0-10 Pain Score: 3  Pain Location: left knee Pain Descriptors / Indicators: Aching, Sore Pain Intervention(s): Repositioned, Monitored during session, Ice applied    Home Living                          Prior Function            PT Goals (current goals can now be found in the care plan section) Progress towards PT goals: Progressing toward goals    Frequency  7X/week      PT Plan Current plan remains appropriate    Co-evaluation              AM-PAC PT "6 Clicks" Mobility   Outcome Measure  Help needed turning from your back to your side while in a flat bed without using bedrails?: A Little Help needed moving from lying on your back to sitting on the side of a flat bed without using bedrails?: A Little Help needed moving to and from a bed to a chair (including a wheelchair)?: A Little Help needed standing up from a chair using your arms (e.g., wheelchair or bedside chair)?: A Little Help needed to walk in hospital room?: A Little Help needed climbing 3-5 steps with a railing? : A Little 6 Click Score: 18    End of Session Equipment Utilized During Treatment: Gait belt Activity Tolerance: Patient tolerated treatment well Patient left: with call bell/phone  within reach;in chair Nurse Communication: Mobility status PT Visit Diagnosis: Difficulty in walking, not elsewhere classified (R26.2)     Time: 7014-1030 PT Time Calculation (min) (ACUTE ONLY): 20 min  Charges:  $Therapeutic Exercise: 8-22 mins                    Jannette Spanner PT, DPT Physical Therapist Acute Rehabilitation Services Preferred contact method: Secure Chat Weekend Pager Only: [phone] Office: [place] 07/26/2022, 1:08 PM

## 2022-07-27 ENCOUNTER — Encounter (HOSPITAL_COMMUNITY): Payer: Self-pay | Admitting: Orthopaedic Surgery

## 2022-07-27 NOTE — Discharge Summary (Signed)
Patient ID: Krystal Kane MRN: [id number] DOB/AGE: 11-19-1960 62 y.o.  Admit date: 07/24/2022 Discharge date: 07/26/22  Admission Diagnoses:  Principal Problem:   Unilateral primary osteoarthritis, left knee Active Problems:   Status post total left knee replacement   Discharge Diagnoses:  Same  Past Medical History:  Diagnosis Date   Allergy    seasonal   Arthritis    osteoarthritis   Cancer ([place])    ductal ca in situ rt breast   Depression    Family history of adverse reaction to anesthesia    Father died from reaction to Anesthesia 1971   Fluid retention    Heart murmur    History of kidney stones    Hypertension    Personal history of radiation therapy 2003   Sweet's syndrome     Surgeries: Procedure(s): LEFT TOTAL KNEE ARTHROPLASTY on 07/24/2022   Consultants:   Discharged Condition: Improved  Hospital Course: Krystal Kane is an 62 y.o. female who was admitted 07/24/2022 for operative treatment ofUnilateral primary osteoarthritis, left knee. Patient has severe unremitting pain that affects sleep, daily activities, and work/hobbies. After pre-op clearance the patient was taken to the operating room on 07/24/2022 and underwent  Procedure(s): LEFT TOTAL KNEE ARTHROPLASTY.    Patient was given perioperative antibiotics:  Anti-infectives (From admission, onward)    Start     Dose/Rate Route Frequency Ordered Stop   07/24/22 1400  ceFAZolin (ANCEF) IVPB 1 g/50 mL premix        1 g 100 mL/hr over 30 Minutes Intravenous Every 6 hours 07/24/22 1028 07/24/22 2018   07/24/22 0600  ceFAZolin (ANCEF) IVPB 2g/100 mL premix        2 g 200 mL/hr over 30 Minutes Intravenous On call to O.R. 07/24/22 0534 07/24/22 0726        Patient was given sequential compression devices, early ambulation, and chemoprophylaxis to prevent DVT.  Patient benefited maximally from hospital stay and there were no complications.    Recent vital signs: No data found.   Recent laboratory studies:   Recent Labs    07/25/22 0335  WBC 15.9*  HGB 11.1*  HCT 34.8*  PLT 249  NA 138  K 4.2  CL 107  CO2 27  BUN 11  CREATININE 0.61  GLUCOSE 133*  CALCIUM 8.4*     Discharge Medications:   Allergies as of 07/26/2022   No Known Allergies      Medication List     TAKE these medications    amLODipine 5 MG tablet Commonly known as: NORVASC Take 5 mg by mouth daily.   aspirin 81 MG chewable tablet Chew 1 tablet (81 mg total) by mouth 2 (two) times daily.   furosemide 20 MG tablet Commonly known as: LASIX Take 20 mg by mouth daily.   HYDROmorphone 4 MG tablet Commonly known as: Dilaudid Take 1 tablet (4 mg total) by mouth every 4 (four) hours as needed for severe pain.   losartan 100 MG tablet Commonly known as: COZAAR TAKE 1 TABLET BY MOUTH EVERY DAY   Potassium Chloride ER 20 MEQ Tbcr Take 20 mEq by mouth 2 (two) times daily.   tiZANidine 4 MG tablet Commonly known as: ZANAFLEX Take 1 tablet (4 mg total) by mouth every 6 (six) hours as needed for muscle spasms.   Vitamin D 50 MCG (2000 UT) tablet Take 2,000 Units by mouth daily.        Diagnostic Studies: DG Knee Left Port  Result Date:  07/24/2022 CLINICAL DATA:  Post-operative [id number] Post-operative state [id number] EXAM: PORTABLE LEFT KNEE - 1-2 VIEW COMPARISON:  None Available. FINDINGS: Total knee arthroplasty. Prosthetic components are well seated. Expected soft tissue changes in the anterior knee. IMPRESSION: Total knee arthroplasty without complication. Electronically Signed   By: Suzy Bouchard M.D.   On: 07/24/2022 09:46   DG Knee Left Port  Result Date: 07/24/2022 CLINICAL DATA:  Intraoperative left knee radiograph. Missing saw blade. Dr. Ninfa Linden reviewed image in operating room. EXAM: PORTABLE LEFT KNEE - 1-2 VIEW COMPARISON:  Left knee radiographs 04/22/2022 FINDINGS: Single frontal intraoperative view of the left knee. Status post interval transverse resection of the distal aspect of the medial  and lateral femoral condyle and the proximal aspect of the mediolateral tibial plateaus. Presumed ongoing surgery for total left knee arthroplasty. There is expected intra-articular and subcutaneous air. No metallic radiopaque foreign body is seen, noting question of missing saw blade. IMPRESSION: Intraoperative radiograph of the left knee. No metallic saw blade is visualized. Electronically Signed   By: Yvonne Kendall M.D.   On: 07/24/2022 08:46    Disposition: Discharge disposition: 01-Home or Self Care       Discharge Instructions     Call MD / Call 911   Complete by: As directed    If you experience chest pain or shortness of breath, CALL 911 and be transported to the hospital emergency room.  If you develope a fever above 101 F, pus (white drainage) or increased drainage or redness at the wound, or calf pain, call your surgeon's office.   Constipation Prevention   Complete by: As directed    Drink plenty of fluids.  Prune juice may be helpful.  You may use a stool softener, such as Colace (over the counter) 100 mg twice a day.  Use MiraLax (over the counter) for constipation as needed.   Diet - low sodium heart healthy   Complete by: As directed    Increase activity slowly as tolerated   Complete by: As directed    Post-operative opioid taper instructions:   Complete by: As directed    POST-OPERATIVE OPIOID TAPER INSTRUCTIONS: It is important to wean off of your opioid medication as soon as possible. If you do not need pain medication after your surgery it is ok to stop day one. Opioids include: Codeine, Hydrocodone(Norco, Vicodin), Oxycodone(Percocet, oxycontin) and hydromorphone amongst others.  Long term and even short term use of opiods can cause: Increased pain response Dependence Constipation Depression Respiratory depression And more.  Withdrawal symptoms can include Flu like symptoms Nausea, vomiting And more Techniques to manage these symptoms Hydrate well Eat  regular healthy meals Stay active Use relaxation techniques(deep breathing, meditating, yoga) Do Not substitute Alcohol to help with tapering If you have been on opioids for less than two weeks and do not have pain than it is ok to stop all together.  Plan to wean off of opioids This plan should start within one week post op of your joint replacement. Maintain the same interval or time between taking each dose and first decrease the dose.  Cut the total daily intake of opioids by one tablet each day Next start to increase the time between doses. The last dose that should be eliminated is the evening dose.           Follow-up Information     [facility] [place] Follow up.   Specialty: Home Health Services Why: to provide home physical therapy visits Contact information: [address] [place] [address] [phone]         Mcarthur Rossetti, MD Follow up in 2 week(s).   Specialty: Orthopedic Surgery Contact information: [address] [place] [address] [phone]                  Signed: Erskine Emery 07/27/2022, 1:07 PM

## 2022-07-30 ENCOUNTER — Encounter: Payer: Self-pay | Admitting: Orthopaedic Surgery

## 2022-07-30 ENCOUNTER — Other Ambulatory Visit: Payer: Self-pay | Admitting: Orthopaedic Surgery

## 2022-07-30 ENCOUNTER — Encounter: Payer: BC Managed Care – PPO | Admitting: Orthopaedic Surgery

## 2022-07-30 MED ORDER — TRAMADOL HCL 50 MG PO TABS: 50.0000 mg | ORAL_TABLET | Freq: Four times a day (QID) | ORAL | 0 refills | Status: DC | PRN

## 2022-08-02 IMAGING — MG MM DIGITAL SCREENING BILAT W/ TOMO AND CAD
8 series · 8 of 24 positions shown · non-contrast
Comparison: Previous exam(s).

CLINICAL DATA: Screening.

EXAM:
DIGITAL SCREENING BILATERAL MAMMOGRAM WITH TOMOSYNTHESIS AND CAD
TECHNIQUE: Bilateral screening digital craniocaudal and mediolateral oblique
mammograms were obtained. Bilateral screening digital breast
tomosynthesis was performed. The images were evaluated with
computer-aided detection.

[L MLO synth-2D]
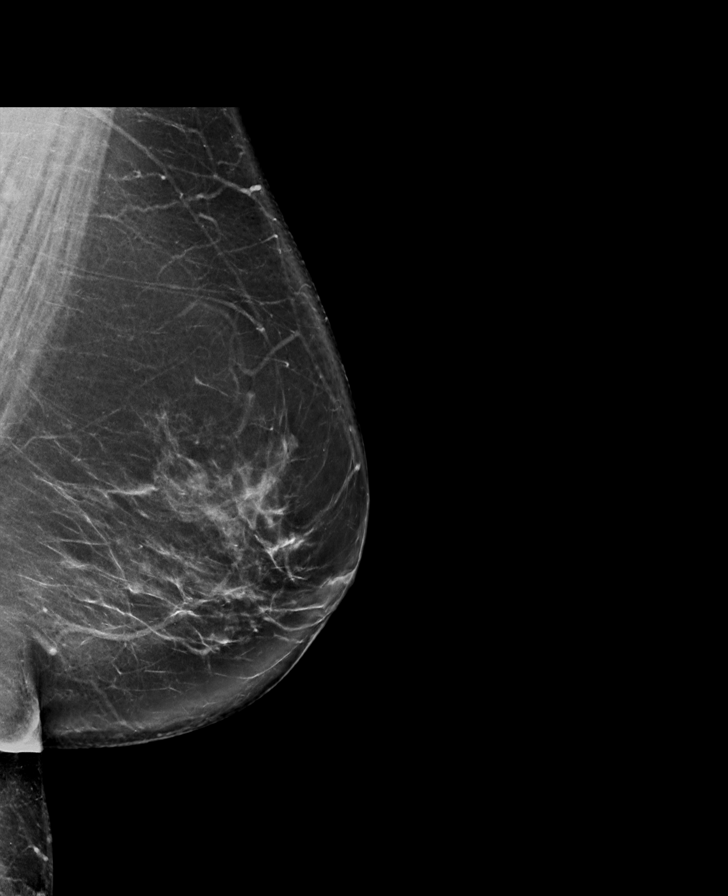

[R MLO synth-2D]
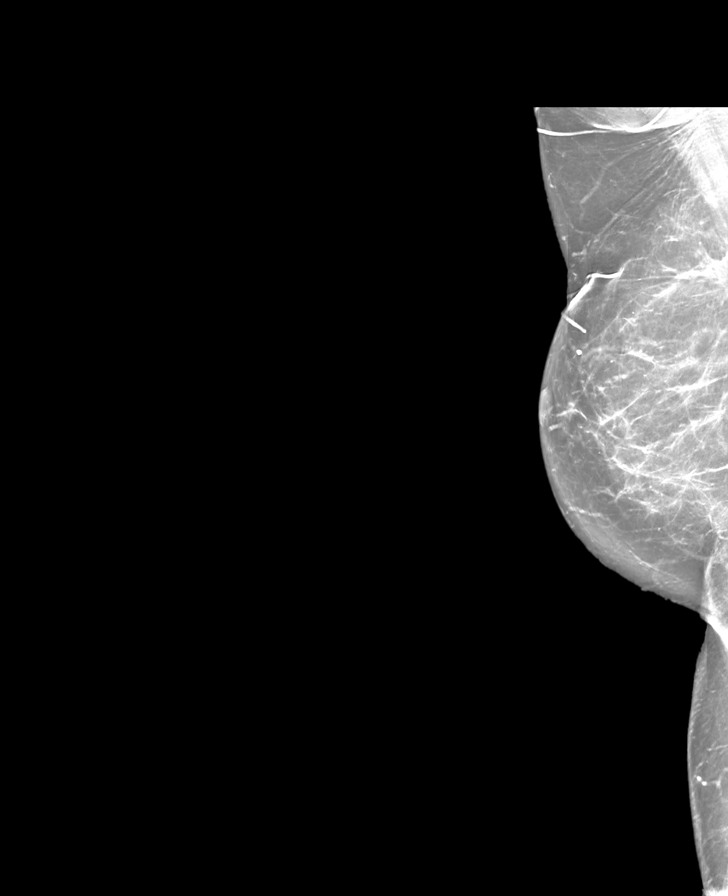

[R CC synth-2D]
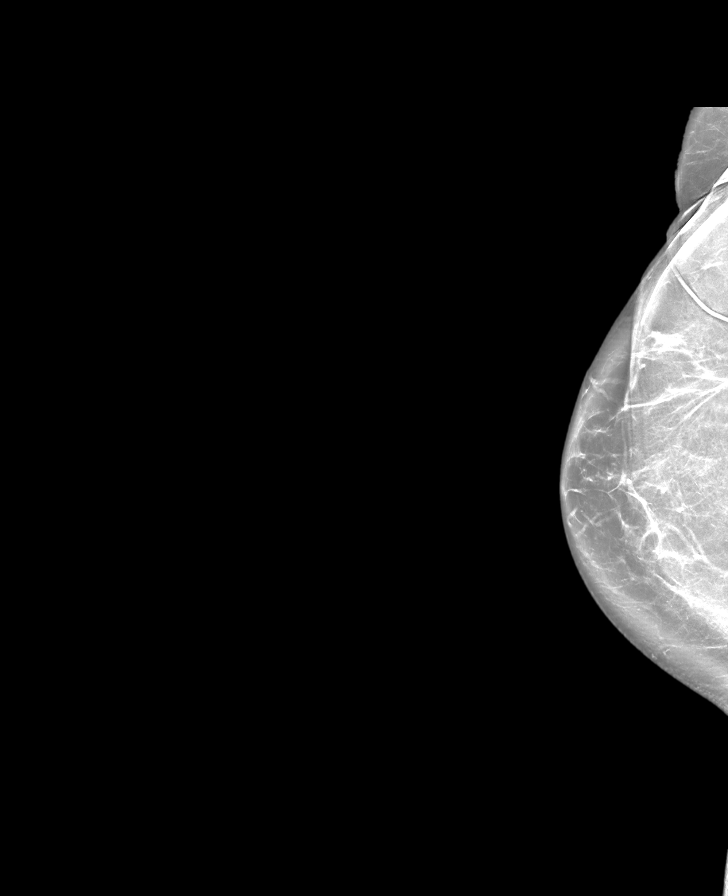

[L CC synth-2D]
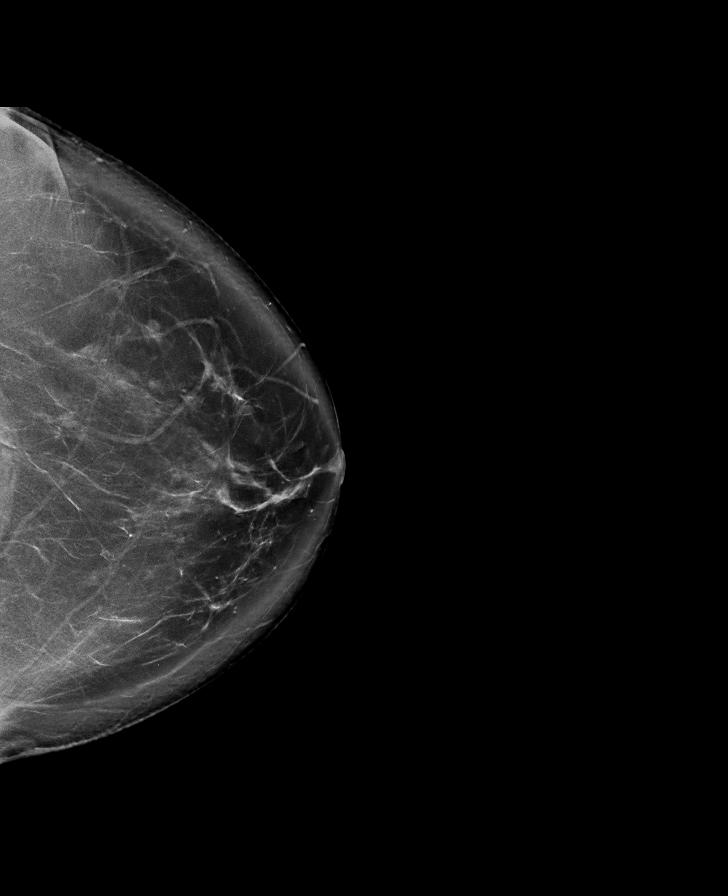

[L MLO tomo · tomo slice 47/94.0]
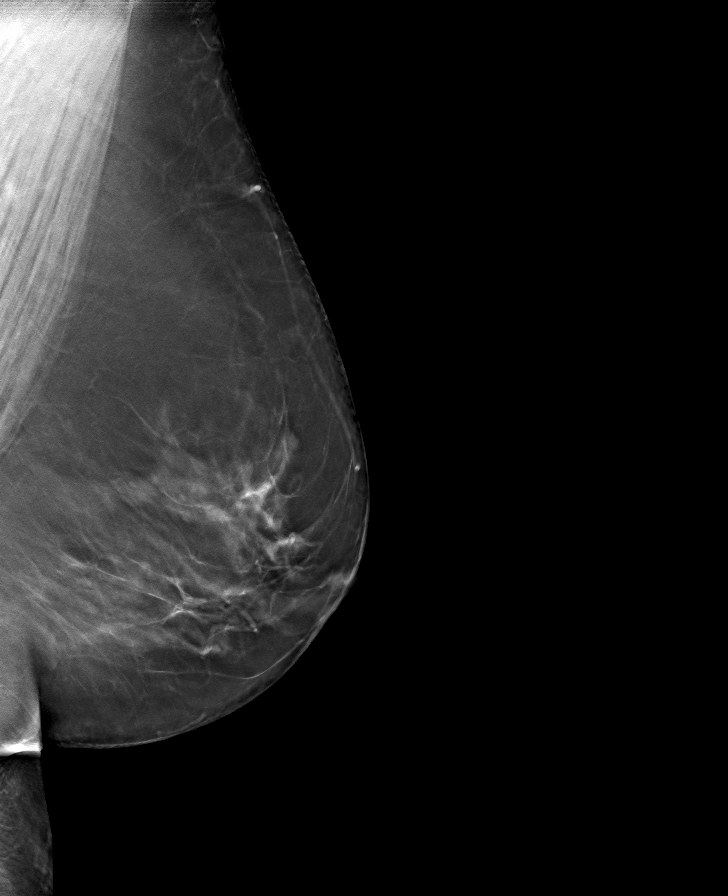

[L CC tomo · tomo slice 51/101.0]
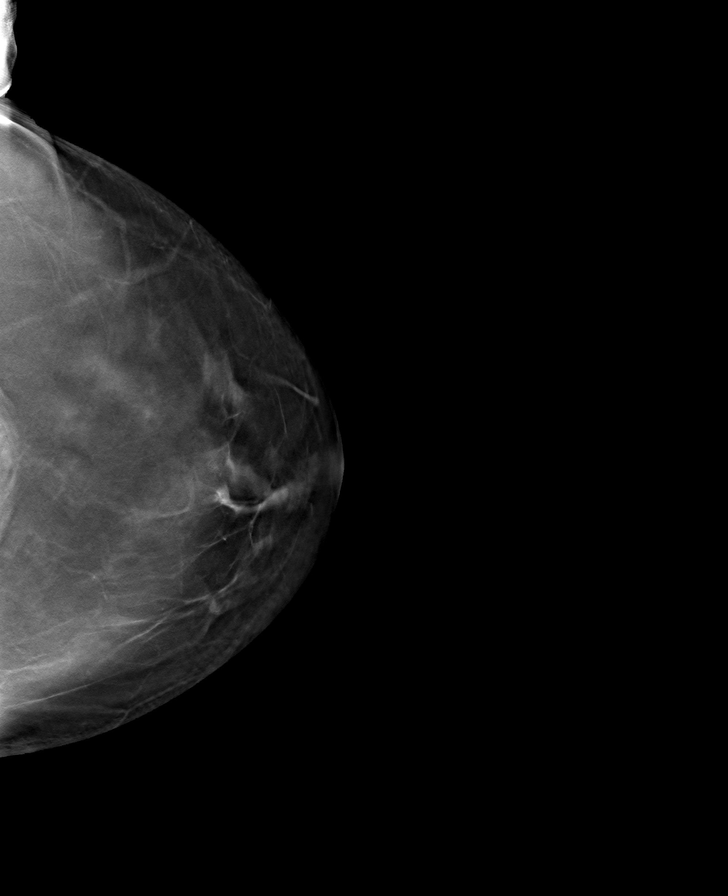

[R CC tomo · tomo slice 45/90.0]
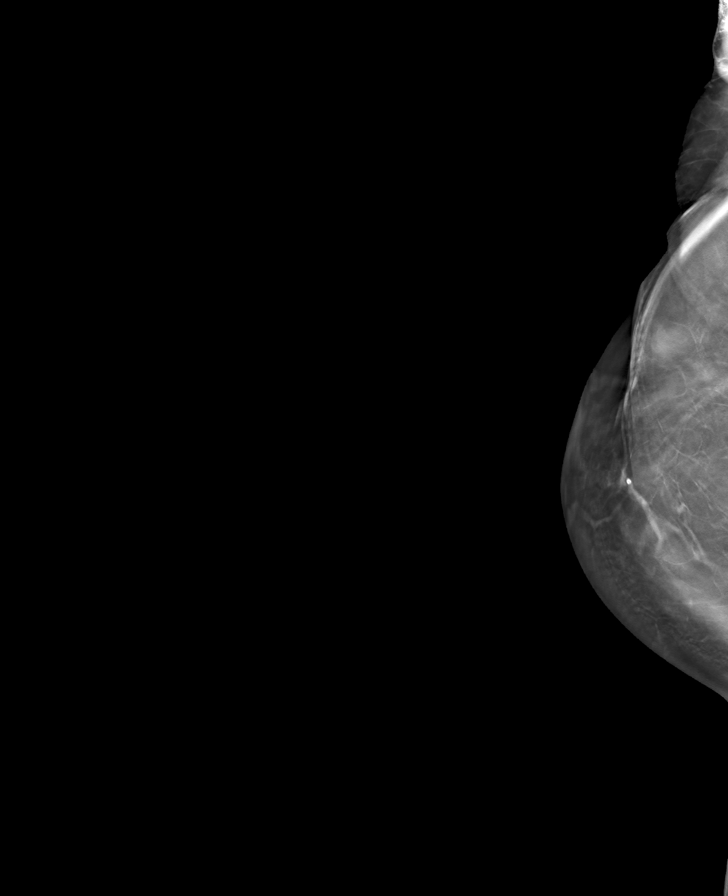

[R MLO tomo · tomo slice 42/83.0]
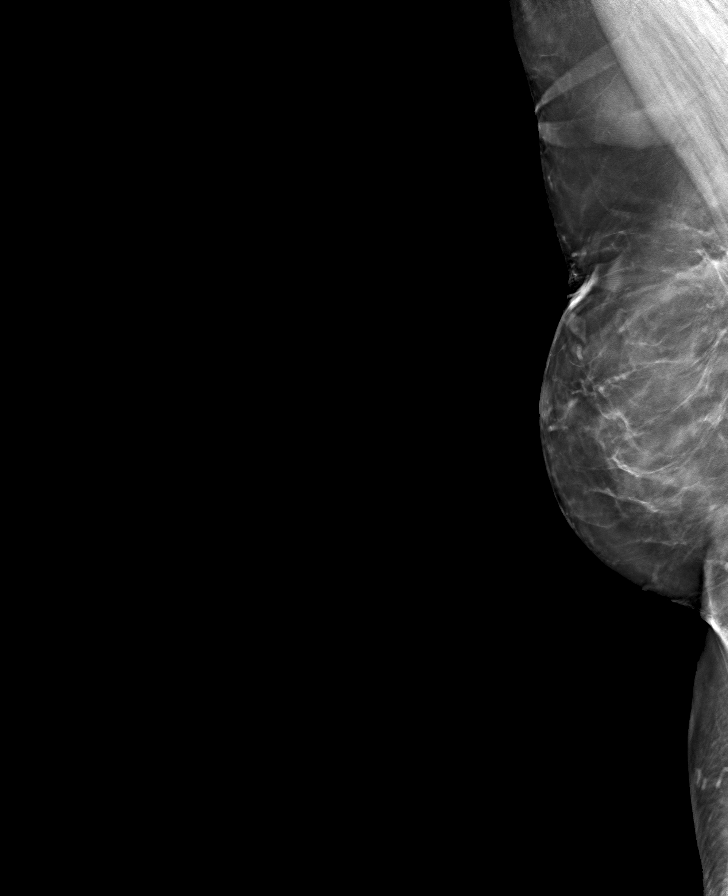

[8 of 24 positions shown; findings below may reference images not displayed]

ACR Breast Density Category b: There are scattered areas of
fibroglandular density.
FINDINGS: There are no findings suspicious for malignancy.
IMPRESSION: No mammographic evidence of malignancy. A result letter of this
screening mammogram will be mailed directly to the patient.

RECOMMENDATION:
Screening mammogram in one year. (Code:51-O-LD2)

BI-RADS CATEGORY  1: Negative.

## 2022-08-06 ENCOUNTER — Encounter: Payer: Self-pay | Admitting: Orthopaedic Surgery

## 2022-08-06 ENCOUNTER — Ambulatory Visit (INDEPENDENT_AMBULATORY_CARE_PROVIDER_SITE_OTHER): Payer: BC Managed Care – PPO | Admitting: Orthopaedic Surgery

## 2022-08-06 DIAGNOSIS — Z96652 Presence of left artificial knee joint: Secondary | ICD-10-CM

## 2022-08-06 MED ORDER — TIZANIDINE HCL 4 MG PO TABS: 4.0000 mg | ORAL_TABLET | Freq: Four times a day (QID) | ORAL | 1 refills | Status: DC | PRN

## 2022-08-06 MED ORDER — TRAMADOL HCL 50 MG PO TABS: 50.0000 mg | ORAL_TABLET | Freq: Four times a day (QID) | ORAL | 0 refills | Status: DC | PRN

## 2022-08-06 NOTE — Progress Notes (Signed)
The patient is here for her first visit status post a left total knee arthroplasty.  She is doing well overall.  She reports good strength and range of motion.  We have replaced her right knee in the past.  She feels like this one has been slightly less painful.  She does need a refill of tramadol and Zanaflex.  On exam her extension is almost full and her flexion is to 90 degrees.  Her calf is soft.  Her leg feels stable.  She will transition to outpatient physical therapy and I gave her a generic prescription for outpatient physical therapy in [place].  We will see her back in 4 weeks to see how she is doing overall but no x-rays are needed.  All question concerns were addressed and answered.

## 2022-08-12 ENCOUNTER — Other Ambulatory Visit: Payer: Self-pay | Admitting: Orthopaedic Surgery

## 2022-08-12 MED ORDER — TRAMADOL HCL 50 MG PO TABS: 50.0000 mg | ORAL_TABLET | Freq: Four times a day (QID) | ORAL | 0 refills | Status: DC | PRN

## 2022-08-13 ENCOUNTER — Other Ambulatory Visit: Payer: Self-pay | Admitting: Orthopaedic Surgery

## 2022-08-13 ENCOUNTER — Encounter: Payer: Self-pay | Admitting: Orthopaedic Surgery

## 2022-08-18 ENCOUNTER — Encounter: Payer: Self-pay | Admitting: Physical Therapy

## 2022-08-18 ENCOUNTER — Other Ambulatory Visit: Payer: Self-pay

## 2022-08-18 ENCOUNTER — Ambulatory Visit: Payer: BC Managed Care – PPO | Attending: Orthopaedic Surgery | Admitting: Physical Therapy

## 2022-08-18 DIAGNOSIS — R262 Difficulty in walking, not elsewhere classified: Secondary | ICD-10-CM

## 2022-08-18 DIAGNOSIS — M25562 Pain in left knee: Secondary | ICD-10-CM | POA: Diagnosis present

## 2022-08-18 DIAGNOSIS — M6281 Muscle weakness (generalized): Secondary | ICD-10-CM

## 2022-08-18 NOTE — Therapy (Signed)
OUTPATIENT PHYSICAL THERAPY LOWER EXTREMITY EVALUATION   Patient Name: Krystal Kane MRN: [id number] DOB:06/14/1961, 62 y.o., female Today's Date: 08/18/2022  END OF SESSION:  PT End of Session - 08/18/22 1318     Visit Number 1    Number of Visits 12    Date for PT Re-Evaluation 09/29/22    PT Start Time 1100    PT Stop Time 1130    PT Time Calculation (min) 30 min    Activity Tolerance Patient tolerated treatment well    Behavior During Therapy WFL for tasks assessed/performed             Past Medical History:  Diagnosis Date   Allergy    seasonal   Arthritis    osteoarthritis   Cancer ([place])    ductal ca in situ rt breast   Depression    Family history of adverse reaction to anesthesia    Father died from reaction to Anesthesia 1971   Fluid retention    Heart murmur    History of kidney stones    Hypertension    Personal history of radiation therapy 2003   Sweet's syndrome    Past Surgical History:  Procedure Laterality Date   ABDOMINAL HYSTERECTOMY     APPENDECTOMY     BREAST LUMPECTOMY Right 2003   BREAST SURGERY     rt lumpectomy   KNEE ARTHROSCOPY  02/2010   right   LAPAROSCOPIC APPENDECTOMY  10/2010   TOTAL KNEE ARTHROPLASTY Right 05/08/2016   Procedure: RIGHT TOTAL KNEE ARTHROPLASTY;  Surgeon: Mcarthur Rossetti, MD;  Location: [facility];  Service: Orthopedics;  Laterality: Right;   TOTAL KNEE ARTHROPLASTY Left 07/24/2022   Procedure: LEFT TOTAL KNEE ARTHROPLASTY;  Surgeon: Mcarthur Rossetti, MD;  Location: [facility];  Service: Orthopedics;  Laterality: Left;   Patient Active Problem List   Diagnosis Date Noted   Status post total left knee replacement 07/24/2022   Status post total right knee replacement 05/08/2016   Sweet's syndrome 09/15/2014   Anxiety 09/19/2011   Unilateral primary osteoarthritis, right knee 09/19/2011   History of kidney stones 09/19/2011   Hypertension 12/15/2010   Obesity 12/15/2010   Depression 12/15/2010   History  of breast cancer 12/15/2010    PCP: Elenor Quinones  REFERRING PROVIDER: Jean Rosenthal  REFERRING DIAG: Lt TKA  THERAPY DIAG:  Acute pain of left knee  Muscle weakness (generalized)  Difficulty in walking, not elsewhere classified  Rationale for Evaluation and Treatment: Rehabilitation  ONSET DATE: 07/24/2022  SUBJECTIVE:   SUBJECTIVE STATEMENT: Pt is s/p Lt TKA 07/24/22. She was initially having HHPT and states her last visit was 1 week ago but she has been performing HEP. She states she still has pain and wakes up in the night due to pain. Once she takes meds she feels better. She is walking without a device. Pt is able to negotiate stairs to enter home and is able to live on 1 level. She states she feels her muscles are "tight" and she gets "knots" in her muscles which cause pain. Pt continues to manage pain with ice and pain meds.  PERTINENT HISTORY: Rt TKA 2017 PAIN:  Are you having pain? Yes: NPRS scale: 4/10 currently 9/10 at night/10 Pain location: Lt knee Pain description: sore, achey Aggravating factors: standing and walking after prolonged sitting Relieving factors: ice,meds  PRECAUTIONS: None  WEIGHT BEARING RESTRICTIONS: No  FALLS:  Has patient fallen in last 6 months? 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  PLOF: Independent  PATIENT GOALS: decrease pain, return to "normal" walking  NEXT MD VISIT: 09/15/22  OBJECTIVE:     PATIENT SURVEYS:  FOTO 59   EDEMA:  Mild edema Lt knee   PALPATION: Steri strips in place over incision Patella mobility WFL  LOWER EXTREMITY ROM:  Active ROM Right eval Left eval  Hip flexion    Hip extension    Hip abduction    Hip adduction    Hip internal rotation    Hip external rotation    Knee flexion  90  Knee extension  -6  Ankle dorsiflexion    Ankle plantarflexion    Ankle inversion    Ankle eversion     (Blank rows = not tested)  LOWER EXTREMITY MMT:  MMT Right eval  Left eval  Hip flexion    Hip extension    Hip abduction    Hip adduction    Hip internal rotation    Hip external rotation    Knee flexion 4+ 4  Knee extension 4+ 4-  Ankle dorsiflexion    Ankle plantarflexion    Ankle inversion    Ankle eversion     (Blank rows = not tested)   FUNCTIONAL TESTS:  5 times sit to stand: 10.9 seconds  GAIT: Distance walked: 100' Assistive device utilized: None Level of assistance: Complete Independence Comments: mild antalgic gait with decreased knee flexion on Lt   TODAY'S TREATMENT:                                                                                                                              DATE: 08/18/22 See HEP    PATIENT EDUCATION:  Education details: PT POC and goals, HEP Person educated: Patient Education method: Explanation, Demonstration, and Handouts Education comprehension: verbalized understanding and returned demonstration  HOME EXERCISE PROGRAM: Access Code: [email or web address] URL: [email or web address] Date: 08/18/2022 Prepared by: Isabelle Course  Exercises - Lateral Step Down  - 1 x daily - 7 x weekly - 3 sets - 10 reps - Forward Step Down  - 1 x daily - 7 x weekly - 3 sets - 10 reps - Standing Knee Flexion Stretch on Step  - 1 x daily - 7 x weekly - 1 sets - 3 reps - 20-30 seconds hold - Standing Hamstring Curl with Resistance  - 1 x daily - 7 x weekly - 3 sets - 10 reps - Hip Abduction with Resistance Loop  - 1 x daily - 7 x weekly - 3 sets - 10 reps - Hip Extension with Resistance Loop  - 1 x daily - 7 x weekly - 3 sets - 10 reps  ASSESSMENT:  CLINICAL IMPRESSION: Patient is a 62 y.o. female who was seen today for physical therapy evaluation and treatment for s/p Lt TKA 07/24/22. She presents with decreased strength and ROM, increased pain, decreased balance and activity tolerance. Pt will benefit from skilled PT to  address deficits and improve functional mobility.   OBJECTIVE IMPAIRMENTS:  decreased activity tolerance, decreased balance, decreased endurance, difficulty walking, decreased ROM, decreased strength, increased edema, increased muscle spasms, and pain.   ACTIVITY LIMITATIONS: bending, squatting, stairs, and locomotion level  PARTICIPATION LIMITATIONS: cleaning and community activity  PERSONAL FACTORS: Time since onset of injury/illness/exacerbation are also affecting patient's functional outcome.   REHAB POTENTIAL: Good  CLINICAL DECISION MAKING: Stable/uncomplicated  EVALUATION COMPLEXITY: Low   GOALS: Goals reviewed with patient? Yes  SHORT TERM GOALS: Target date: 09/01/2022   Pt will be independent with initial HEP Baseline: Goal status: INITIAL    LONG TERM GOALS: Target date: 09/29/2022    Pt will be independent with advanced HEP Baseline:  Goal status: INITIAL  2.  Pt will improve FOTO to >= 71 to demo improved functional mobility Baseline:  Goal status: INITIAL  3.  Pt will improve Lt knee ROM to 0-115 to improve ability to perform stairs Baseline:  Goal status: INITIAL  4.  Pt will improve 5x STS to <= 9 seconds to demo improved functional strength Baseline:  Goal status: INITIAL  5.  Pt will sleep x 6 hours without waking due to pain Baseline:  Goal status: INITIAL    PLAN:  PT FREQUENCY: 2x/week  PT DURATION: 6 weeks  PLANNED INTERVENTIONS: Therapeutic exercises, Therapeutic activity, Neuromuscular re-education, Balance training, Gait training, Patient/Family education, Self Care, Joint mobilization, Stair training, Aquatic Therapy, Dry Needling, Electrical stimulation, Cryotherapy, Moist heat, Taping, Vasopneumatic device, Ultrasound, Ionotophoresis '4mg'$ /ml Dexamethasone, Manual therapy, and Re-evaluation  PLAN FOR NEXT SESSION: Lt knee ROM and strength, balance   Cresencia Asmus, PT 08/18/2022, 1:20 PM

## 2022-08-19 ENCOUNTER — Other Ambulatory Visit: Payer: Self-pay | Admitting: Orthopaedic Surgery

## 2022-08-19 ENCOUNTER — Other Ambulatory Visit: Payer: Self-pay | Admitting: Physician Assistant

## 2022-08-19 MED ORDER — TIZANIDINE HCL 4 MG PO TABS: 4.0000 mg | ORAL_TABLET | Freq: Four times a day (QID) | ORAL | 1 refills | Status: DC | PRN

## 2022-08-19 MED ORDER — TRAMADOL HCL 50 MG PO TABS: 50.0000 mg | ORAL_TABLET | Freq: Four times a day (QID) | ORAL | 0 refills | Status: DC | PRN

## 2022-08-21 ENCOUNTER — Ambulatory Visit
Payer: BC Managed Care – PPO | Attending: Orthopaedic Surgery | Admitting: Rehabilitative and Restorative Service Providers"

## 2022-08-21 ENCOUNTER — Encounter: Payer: Self-pay | Admitting: Rehabilitative and Restorative Service Providers"

## 2022-08-21 ENCOUNTER — Encounter: Payer: Self-pay | Admitting: Orthopaedic Surgery

## 2022-08-21 DIAGNOSIS — R262 Difficulty in walking, not elsewhere classified: Secondary | ICD-10-CM

## 2022-08-21 DIAGNOSIS — M6281 Muscle weakness (generalized): Secondary | ICD-10-CM

## 2022-08-21 DIAGNOSIS — R600 Localized edema: Secondary | ICD-10-CM

## 2022-08-21 DIAGNOSIS — M25562 Pain in left knee: Secondary | ICD-10-CM | POA: Diagnosis not present

## 2022-08-21 NOTE — Therapy (Signed)
OUTPATIENT PHYSICAL THERAPY LOWER EXTREMITYTREATMENT   Patient Name: SEEMA WHORTON MRN: [id number] DOB:09-06-1960, 62 y.o., female Today's Date: 08/21/2022  END OF SESSION:  PT End of Session - 08/21/22 1017     Visit Number 2    Number of Visits 12    Date for PT Re-Evaluation 09/29/22    Authorization Type BCBS    PT Start Time 1018    PT Stop Time 1100    PT Time Calculation (min) 42 min    Activity Tolerance Patient tolerated treatment well    Behavior During Therapy WFL for tasks assessed/performed             Past Medical History:  Diagnosis Date   Allergy    seasonal   Arthritis    osteoarthritis   Cancer ([place])    ductal ca in situ rt breast   Depression    Family history of adverse reaction to anesthesia    Father died from reaction to Anesthesia 1971   Fluid retention    Heart murmur    History of kidney stones    Hypertension    Personal history of radiation therapy 2003   Sweet's syndrome    Past Surgical History:  Procedure Laterality Date   ABDOMINAL HYSTERECTOMY     APPENDECTOMY     BREAST LUMPECTOMY Right 2003   BREAST SURGERY     rt lumpectomy   KNEE ARTHROSCOPY  02/2010   right   LAPAROSCOPIC APPENDECTOMY  10/2010   TOTAL KNEE ARTHROPLASTY Right 05/08/2016   Procedure: RIGHT TOTAL KNEE ARTHROPLASTY;  Surgeon: Mcarthur Rossetti, MD;  Location: [facility];  Service: Orthopedics;  Laterality: Right;   TOTAL KNEE ARTHROPLASTY Left 07/24/2022   Procedure: LEFT TOTAL KNEE ARTHROPLASTY;  Surgeon: Mcarthur Rossetti, MD;  Location: [facility];  Service: Orthopedics;  Laterality: Left;   Patient Active Problem List   Diagnosis Date Noted   Status post total left knee replacement 07/24/2022   Status post total right knee replacement 05/08/2016   Sweet's syndrome 09/15/2014   Anxiety 09/19/2011   Unilateral primary osteoarthritis, right knee 09/19/2011   History of kidney stones 09/19/2011   Hypertension 12/15/2010   Obesity 12/15/2010    Depression 12/15/2010   History of breast cancer 12/15/2010    PCP: Elenor Quinones  REFERRING PROVIDER: Jean Rosenthal  REFERRING DIAG: Lt TKA  THERAPY DIAG:  Acute pain of left knee  Muscle weakness (generalized)  Difficulty in walking, not elsewhere classified  Rationale for Evaluation and Treatment: Rehabilitation  ONSET DATE: 07/24/2022  SUBJECTIVE:   SUBJECTIVE STATEMENT: The patient notes pain is worse at night She is walking frequently at home. She gets heaviness in her L foot throughout the day.   EVAL:  Pt is s/p Lt TKA 07/24/22. She was initially having HHPT and states her last visit was 1 week ago but she has been performing HEP. She states she still has pain and wakes up in the night due to pain. Once she takes meds she feels better. She is walking without a device. Pt is able to negotiate stairs to enter home and is able to live on 1 level. She states she feels her muscles are "tight" and she gets "knots" in her muscles which cause pain. Pt continues to manage pain with ice and pain meds.  PERTINENT HISTORY: Rt TKA 2017 PAIN:  Are you having pain? Yes: NPRS scale: 4/10 currently worse at night/10 Pain location: Lt knee Pain description: sore, achey Aggravating factors: standing  and walking after prolonged sitting Relieving factors: ice,meds  PRECAUTIONS: None  WEIGHT BEARING RESTRICTIONS: No  FALLS:  Has patient fallen in last 6 months? No  PATIENT GOALS: decrease pain, return to "normal" walking  NEXT MD VISIT: 09/15/22  OBJECTIVE:  (Measures in this section from initial evaluation unless otherwise noted) PATIENT SURVEYS:  FOTO 59  EDEMA:  Mild edema Lt knee  PALPATION: Steri strips in place over incision Patella mobility WFL  LOWER EXTREMITY ROM:  Active ROM Right eval Left eval  Knee flexion  90  Knee extension  -6   LOWER EXTREMITY MMT:  MMT Right eval Left eval  Knee flexion 4+ 4  Knee extension 4+ 4-   FUNCTIONAL  TESTS:  5 times sit to stand: 10.9 seconds  GAIT: Distance walked: 100' Assistive device utilized: None Level of assistance: Complete Independence Comments: mild antalgic gait with decreased knee flexion on Lt  [facility] Adult PT Treatment:                                                DATE: 08/21/22 Therapeutic Exercise: Bike x 5 minutes doing portion of ROM for AAROM for warm up Prone Knee flexion x 10 reps Passive overpressure into flexion Sidelying Hip abduction x 12 reps provoking a "heaviness" and pain Supine SLR x 12 reps Quad set with towel roll under the ankle provokes increased distal leg pain Standing Lateral step downs x 10 reps Step downs x 6 reps R and L Anterior rocking for knee flexion x 10 reps Heel raises x 10 reps Sidestepping x 8 reps R and L Manual Therapy: STM hamstring and quad Gait: Walking with cues for heel strike x 80 feet, 160 ft Modalities: Gameready x 12 minutes mild compression 34 degrees L knee  PATIENT EDUCATION:  Education details: PT POC and goals, HEP Person educated: Patient Education method: Consulting civil engineer, Media planner, and Handouts Education comprehension: verbalized understanding and returned demonstration  HOME EXERCISE PROGRAM: Access Code: [email or web address] URL: [email or web address] Date: 08/18/2022 Prepared by: Isabelle Course  Exercises - Lateral Step Down  - 1 x daily - 7 x weekly - 3 sets - 10 reps - Forward Step Down  - 1 x daily - 7 x weekly - 3 sets - 10 reps - Standing Knee Flexion Stretch on Step  - 1 x daily - 7 x weekly - 1 sets - 3 reps - 20-30 seconds hold - Standing Hamstring Curl with Resistance  - 1 x daily - 7 x weekly - 3 sets - 10 reps - Hip Abduction with Resistance Loop  - 1 x daily - 7 x weekly - 3 sets - 10 reps - Hip Extension with Resistance Loop  - 1 x daily - 7 x weekly - 3 sets - 10 reps  ASSESSMENT:  CLINICAL IMPRESSION: The patient reports the L knee is progressing much faster than her R  knee rehab. She is lacking the last few degrees of extension noted during gait and gets significant pain with overpressure when attempting to stretch end range flexion. PT adjusting to patient's tolerance. Plan to continue to progress to STGs/LTGs.   OBJECTIVE IMPAIRMENTS: decreased activity tolerance, decreased balance, decreased endurance, difficulty walking, decreased ROM, decreased strength, increased edema, increased muscle spasms, and pain.   GOALS: Goals reviewed with patient? Yes  SHORT TERM GOALS: Target date: 09/01/2022  Pt will be  independent with initial HEP Baseline: Goal status: INITIAL  LONG TERM GOALS: Target date: 09/29/2022  Pt will be independent with advanced HEP Baseline:  Goal status: INITIAL  2.  Pt will improve FOTO to >= 71 to demo improved functional mobility Baseline:  Goal status: INITIAL  3.  Pt will improve Lt knee ROM to 0-115 to improve ability to perform stairs Baseline:  Goal status: INITIAL  4.  Pt will improve 5x STS to <= 9 seconds to demo improved functional strength Baseline:  Goal status: INITIAL  5.  Pt will sleep x 6 hours without waking due to pain Baseline:  Goal status: INITIAL  PLAN:  PT FREQUENCY: 2x/week  PT DURATION: 6 weeks  PLANNED INTERVENTIONS: Therapeutic exercises, Therapeutic activity, Neuromuscular re-education, Balance training, Gait training, Patient/Family education, Self Care, Joint mobilization, Stair training, Aquatic Therapy, Dry Needling, Electrical stimulation, Cryotherapy, Moist heat, Taping, Vasopneumatic device, Ultrasound, Ionotophoresis '4mg'$ /ml Dexamethasone, Manual therapy, and Re-evaluation  PLAN FOR NEXT SESSION: Lt knee ROM and strength, balance   Davin Archuletta, PT 08/21/2022, 10:17 AM

## 2022-08-21 NOTE — Telephone Encounter (Signed)
See message.

## 2022-08-25 ENCOUNTER — Ambulatory Visit: Payer: BC Managed Care – PPO

## 2022-08-25 DIAGNOSIS — M6281 Muscle weakness (generalized): Secondary | ICD-10-CM

## 2022-08-25 DIAGNOSIS — R262 Difficulty in walking, not elsewhere classified: Secondary | ICD-10-CM

## 2022-08-25 DIAGNOSIS — M25562 Pain in left knee: Secondary | ICD-10-CM | POA: Diagnosis not present

## 2022-08-25 DIAGNOSIS — R600 Localized edema: Secondary | ICD-10-CM

## 2022-08-25 NOTE — Therapy (Signed)
OUTPATIENT PHYSICAL THERAPY LOWER EXTREMITY TREATMENT   Patient Name: Krystal Kane MRN: [id number] DOB:1960/09/17, 62 y.o., female Today's Date: 08/25/2022  END OF SESSION:  PT End of Session - 08/25/22 1018     Visit Number 3    Number of Visits 12    Date for PT Re-Evaluation 09/29/22    Authorization Type BCBS    PT Start Time 1018    PT Stop Time 1100    PT Time Calculation (min) 42 min    Activity Tolerance Patient tolerated treatment well    Behavior During Therapy WFL for tasks assessed/performed             Past Medical History:  Diagnosis Date   Allergy    seasonal   Arthritis    osteoarthritis   Cancer ([place])    ductal ca in situ rt breast   Depression    Family history of adverse reaction to anesthesia    Father died from reaction to Anesthesia 1971   Fluid retention    Heart murmur    History of kidney stones    Hypertension    Personal history of radiation therapy 2003   Sweet's syndrome    Past Surgical History:  Procedure Laterality Date   ABDOMINAL HYSTERECTOMY     APPENDECTOMY     BREAST LUMPECTOMY Right 2003   BREAST SURGERY     rt lumpectomy   KNEE ARTHROSCOPY  02/2010   right   LAPAROSCOPIC APPENDECTOMY  10/2010   TOTAL KNEE ARTHROPLASTY Right 05/08/2016   Procedure: RIGHT TOTAL KNEE ARTHROPLASTY;  Surgeon: Mcarthur Rossetti, MD;  Location: [facility];  Service: Orthopedics;  Laterality: Right;   TOTAL KNEE ARTHROPLASTY Left 07/24/2022   Procedure: LEFT TOTAL KNEE ARTHROPLASTY;  Surgeon: Mcarthur Rossetti, MD;  Location: [facility];  Service: Orthopedics;  Laterality: Left;   Patient Active Problem List   Diagnosis Date Noted   Status post total left knee replacement 07/24/2022   Status post total right knee replacement 05/08/2016   Sweet's syndrome 09/15/2014   Anxiety 09/19/2011   Unilateral primary osteoarthritis, right knee 09/19/2011   History of kidney stones 09/19/2011   Hypertension 12/15/2010   Obesity 12/15/2010    Depression 12/15/2010   History of breast cancer 12/15/2010    PCP: Elenor Quinones  REFERRING PROVIDER: Jean Rosenthal  REFERRING DIAG: Lt TKA  THERAPY DIAG:  Acute pain of left knee  Muscle weakness (generalized)  Difficulty in walking, not elsewhere classified  Localized edema  Rationale for Evaluation and Treatment: Rehabilitation  ONSET DATE: 07/24/2022  SUBJECTIVE:   SUBJECTIVE STATEMENT: Patient reports 4/10 pain today, states it is down after significant pain in knee over the weekend. Patient states she has a "catch" above knee when she is walking.  EVAL:  Pt is s/p Lt TKA 07/24/22. She was initially having HHPT and states her last visit was 1 week ago but she has been performing HEP. She states she still has pain and wakes up in the night due to pain. Once she takes meds she feels better. She is walking without a device. Pt is able to negotiate stairs to enter home and is able to live on 1 level. She states she feels her muscles are "tight" and she gets "knots" in her muscles which cause pain. Pt continues to manage pain with ice and pain meds.  PERTINENT HISTORY: Rt TKA 2017 PAIN:  Are you having pain? Yes: NPRS scale: 4/10 currently worse at night/10 Pain location: Lt  knee Pain description: sore, achey Aggravating factors: standing and walking after prolonged sitting Relieving factors: ice,meds  PRECAUTIONS: None  WEIGHT BEARING RESTRICTIONS: No  FALLS:  Has patient fallen in last 6 months? No  PATIENT GOALS: decrease pain, return to "normal" walking  NEXT MD VISIT: 09/15/22  OBJECTIVE:  (Measures in this section from initial evaluation unless otherwise noted) PATIENT SURVEYS:  FOTO 59  EDEMA:  Mild edema Lt knee  PALPATION: Steri strips in place over incision Patella mobility WFL  LOWER EXTREMITY ROM:  Active ROM Right eval Left eval  Knee flexion  90  Knee extension  -6   LOWER EXTREMITY MMT:  MMT Right eval Left eval  Knee  flexion 4+ 4  Knee extension 4+ 4-   FUNCTIONAL TESTS:  5 times sit to stand: 10.9 seconds  GAIT: Distance walked: 100' Assistive device utilized: None Level of assistance: Complete Independence Comments: mild antalgic gait with decreased knee flexion on Lt   [facility] Adult PT Treatment:                                                DATE: 08/25/2022 Therapeutic Exercise: Recumbent bike (partial revolutions) x 3 min Prone  TKE 10x5" Knee flexion + 2-way light resistance from therapist x10 reps Supine: Quad set (discontinued d/t pain at lateral knee) HS/hip add stretches w/strap AAROM heel slides w/strap 5x10" Manual Therapy: Gentle patella mobs STM/IASTM distal/lateral/medial quads    [facility] Adult PT Treatment:                                                DATE: 08/21/22 Therapeutic Exercise: Bike x 5 minutes doing portion of ROM for AAROM for warm up Prone Knee flexion x 10 reps Passive overpressure into flexion Sidelying Hip abduction x 12 reps provoking a "heaviness" and pain Supine SLR x 12 reps Quad set with towel roll under the ankle provokes increased distal leg pain Standing Lateral step downs x 10 reps Step downs x 6 reps R and L Anterior rocking for knee flexion x 10 reps Heel raises x 10 reps Sidestepping x 8 reps R and L Manual Therapy: STM hamstring and quad Gait: Walking with cues for heel strike x 80 feet, 160 ft Modalities: Gameready x 12 minutes mild compression 34 degrees L knee  PATIENT EDUCATION:  Education details: Updated HEP Person educated: Patient Education method: Consulting civil engineer, Media planner, and Handouts Education comprehension: verbalized understanding and returned demonstration  HOME EXERCISE PROGRAM: Access Code: [email or web address] URL: [email or web address] Date: 08/25/2022 Prepared by: Helane Gunther  Exercises - Lateral Step Down  - 1 x daily - 7 x weekly - 3 sets - 10 reps - Forward Step Down  - 1 x daily - 7 x weekly - 3  sets - 10 reps - Standing Knee Flexion Stretch on Step  - 1 x daily - 7 x weekly - 1 sets - 3 reps - 20-30 seconds hold - Standing Hamstring Curl with Resistance  - 1 x daily - 7 x weekly - 3 sets - 10 reps - Hip Abduction with Resistance Loop  - 1 x daily - 7 x weekly - 3 sets - 10 reps - Hip Extension with Resistance Loop  - 1 x  daily - 7 x weekly - 3 sets - 10 reps - Supine ITB Stretch with Strap  - 2 x daily - 7 x weekly - 1 sets - 3-5 reps - 30 sec hold - Prone Quadriceps Set  - 1 x daily - 7 x weekly - 3 sets - 10 reps - 5 sec hold - Prone Terminal Knee Extension  - 1 x daily - 7 x weekly - 3 sets - 10 reps - 5 sec hold  ASSESSMENT:  CLINICAL IMPRESSION: Decreased knee flexion noted during swing phase of gait at start of session. Significant tenderness with palpitation along lateral quad and medial hamstring; decreased tenderness and myofascial tightness noted after manual treatment. Light resistance provided during concentric and eccentric phases of prone knee flexion to progress HS/quad activation. Increased pain at lateral knee with supine quad set; patient able to complete exercise on prone with no exacerbation of pain. Patient demonstrated improved dynamic knee flexion with swing through gaitt mechanics at end of session.   OBJECTIVE IMPAIRMENTS: decreased activity tolerance, decreased balance, decreased endurance, difficulty walking, decreased ROM, decreased strength, increased edema, increased muscle spasms, and pain.   GOALS: Goals reviewed with patient? Yes  SHORT TERM GOALS: Target date: 09/01/2022  Pt will be independent with initial HEP Baseline: Goal status: INITIAL  LONG TERM GOALS: Target date: 09/29/2022  Pt will be independent with advanced HEP Baseline:  Goal status: INITIAL  2.  Pt will improve FOTO to >= 71 to demo improved functional mobility Baseline:  Goal status: INITIAL  3.  Pt will improve Lt knee ROM to 0-115 to improve ability to perform  stairs Baseline:  Goal status: INITIAL  4.  Pt will improve 5x STS to <= 9 seconds to demo improved functional strength Baseline:  Goal status: INITIAL  5.  Pt will sleep x 6 hours without waking due to pain Baseline:  Goal status: INITIAL  PLAN:  PT FREQUENCY: 2x/week  PT DURATION: 6 weeks  PLANNED INTERVENTIONS: Therapeutic exercises, Therapeutic activity, Neuromuscular re-education, Balance training, Gait training, Patient/Family education, Self Care, Joint mobilization, Stair training, Aquatic Therapy, Dry Needling, Electrical stimulation, Cryotherapy, Moist heat, Taping, Vasopneumatic device, Ultrasound, Ionotophoresis '4mg'$ /ml Dexamethasone, Manual therapy, and Re-evaluation  PLAN FOR NEXT SESSION: Lt knee ROM and strength, balance, manual as needed   Hardin Negus, PTA 08/25/2022, 11:02 AM

## 2022-08-27 ENCOUNTER — Ambulatory Visit: Payer: BC Managed Care – PPO

## 2022-08-27 ENCOUNTER — Encounter: Payer: Self-pay | Admitting: Radiology

## 2022-08-27 DIAGNOSIS — R262 Difficulty in walking, not elsewhere classified: Secondary | ICD-10-CM

## 2022-08-27 DIAGNOSIS — R600 Localized edema: Secondary | ICD-10-CM

## 2022-08-27 DIAGNOSIS — M25562 Pain in left knee: Secondary | ICD-10-CM

## 2022-08-27 DIAGNOSIS — M6281 Muscle weakness (generalized): Secondary | ICD-10-CM

## 2022-08-27 NOTE — Therapy (Signed)
OUTPATIENT PHYSICAL THERAPY LOWER EXTREMITY TREATMENT   Patient Name: Krystal Kane MRN: [id number] DOB:July 23, 1960, 62 y.o., female Today's Date: 08/27/2022  END OF SESSION:  PT End of Session - 08/27/22 1017     Visit Number 4    Number of Visits 12    Date for PT Re-Evaluation 09/29/22    Authorization Type [employer]    PT Start Time 1017    PT Stop Time 1058    PT Time Calculation (min) 41 min             Past Medical History:  Diagnosis Date   Allergy    seasonal   Arthritis    osteoarthritis   Cancer ([place])    ductal ca in situ rt breast   Depression    Family history of adverse reaction to anesthesia    Father died from reaction to Anesthesia 1971   Fluid retention    Heart murmur    History of kidney stones    Hypertension    Personal history of radiation therapy 2003   Sweet's syndrome    Past Surgical History:  Procedure Laterality Date   ABDOMINAL HYSTERECTOMY     APPENDECTOMY     BREAST LUMPECTOMY Right 2003   BREAST SURGERY     rt lumpectomy   KNEE ARTHROSCOPY  02/2010   right   LAPAROSCOPIC APPENDECTOMY  10/2010   TOTAL KNEE ARTHROPLASTY Right 05/08/2016   Procedure: RIGHT TOTAL KNEE ARTHROPLASTY;  Surgeon: Mcarthur Rossetti, MD;  Location: [facility];  Service: Orthopedics;  Laterality: Right;   TOTAL KNEE ARTHROPLASTY Left 07/24/2022   Procedure: LEFT TOTAL KNEE ARTHROPLASTY;  Surgeon: Mcarthur Rossetti, MD;  Location: [facility];  Service: Orthopedics;  Laterality: Left;   Patient Active Problem List   Diagnosis Date Noted   Status post total left knee replacement 07/24/2022   Status post total right knee replacement 05/08/2016   Sweet's syndrome 09/15/2014   Anxiety 09/19/2011   Unilateral primary osteoarthritis, right knee 09/19/2011   History of kidney stones 09/19/2011   Hypertension 12/15/2010   Obesity 12/15/2010   Depression 12/15/2010   History of breast cancer 12/15/2010    PCP: Elenor Quinones  REFERRING PROVIDER: Jean Rosenthal  REFERRING DIAG: Lt TKA  THERAPY DIAG:  Acute pain of left knee  Muscle weakness (generalized)  Difficulty in walking, not elsewhere classified  Localized edema  Rationale for Evaluation and Treatment: Rehabilitation  ONSET DATE: 07/24/2022  SUBJECTIVE:   SUBJECTIVE STATEMENT: Patient reports she continues to have "catch" in knee when walking but not as bad as last visit. Patient reports 4/10 pain in L knee today.  EVAL:  Pt is s/p Lt TKA 07/24/22. She was initially having HHPT and states her last visit was 1 week ago but she has been performing HEP. She states she still has pain and wakes up in the night due to pain. Once she takes meds she feels better. She is walking without a device. Pt is able to negotiate stairs to enter home and is able to live on 1 level. She states she feels her muscles are "tight" and she gets "knots" in her muscles which cause pain. Pt continues to manage pain with ice and pain meds.  PERTINENT HISTORY: Rt TKA 2017 PAIN:  Are you having pain? Yes: NPRS scale: 4/10 currently worse at night/10 Pain location: Lt knee Pain description: sore, achey Aggravating factors: standing and walking after prolonged sitting Relieving factors: ice,meds  PRECAUTIONS: None  WEIGHT BEARING  RESTRICTIONS: No  FALLS:  Has patient fallen in last 6 months? No  PATIENT GOALS: decrease pain, return to "normal" walking  NEXT MD VISIT: 09/15/22  OBJECTIVE:  (Measures in this section from initial evaluation unless otherwise noted) PATIENT SURVEYS:  FOTO 59  EDEMA:  Mild edema Lt knee  PALPATION: Steri strips in place over incision Patella mobility WFL  LOWER EXTREMITY ROM:  Active ROM Right eval Left eval  Knee flexion  90  Knee extension  -6   LOWER EXTREMITY MMT:  MMT Right eval Left eval  Knee flexion 4+ 4  Knee extension 4+ 4-   FUNCTIONAL TESTS:  5 times sit to stand: 10.9 seconds  GAIT: Distance walked: 100' Assistive device  utilized: None Level of assistance: Complete Independence Comments: mild antalgic gait with decreased knee flexion on Lt   [facility] Adult PT Treatment:                                                DATE: 08/27/2022 Therapeutic Exercise: SAQ (black bolster) 10x5" AAROM heel slides w/strap 10x10" SLR (small ROM): parallel, ER, IR x10 each Prone: TKE 10x5" Knee flexion + 2-way light resistance from therapist x10 reps HS curls YTB --> no resistance Seated knee flexion stretch 10x10" S/L TFL stretch S/L passive hip flexor stretch Manual Therapy: STM/IASTM distal/laterals, ant tib, ITB/TFL    [facility] Adult PT Treatment:                                                DATE: 08/25/2022 Therapeutic Exercise: Recumbent bike (partial revolutions) x 3 min Prone: TKE 10x5" Knee flexion + 2-way light resistance from therapist x10 reps Supine: Quad set (discontinued d/t pain at lateral knee) HS/hip add stretches w/strap AAROM heel slides w/strap 5x10" Manual Therapy: Gentle patella mobs STM/IASTM distal/lateral/medial quads   PATIENT EDUCATION:  Education details: L leg step overs A/P for swing phase Person educated: Patient Education method: Consulting civil engineer, Media planner, and Handouts Education comprehension: verbalized understanding and returned demonstration  HOME EXERCISE PROGRAM: Access Code: [email or web address] URL: [email or web address] Date: 08/25/2022 Prepared by: Helane Gunther  Exercises - Lateral Step Down  - 1 x daily - 7 x weekly - 3 sets - 10 reps - Forward Step Down  - 1 x daily - 7 x weekly - 3 sets - 10 reps - Standing Knee Flexion Stretch on Step  - 1 x daily - 7 x weekly - 1 sets - 3 reps - 20-30 seconds hold - Standing Hamstring Curl with Resistance  - 1 x daily - 7 x weekly - 3 sets - 10 reps - Hip Abduction with Resistance Loop  - 1 x daily - 7 x weekly - 3 sets - 10 reps - Hip Extension with Resistance Loop  - 1 x daily - 7 x weekly - 3 sets - 10 reps - Supine ITB  Stretch with Strap  - 2 x daily - 7 x weekly - 1 sets - 3-5 reps - 30 sec hold - Prone Quadriceps Set  - 1 x daily - 7 x weekly - 3 sets - 10 reps - 5 sec hold - Prone Terminal Knee Extension  - 1 x daily - 7 x weekly - 3 sets -  10 reps - 5 sec hold  ASSESSMENT:  CLINICAL IMPRESSION: Verbal cues improved pelvic stability and decreased discomfort in L knee during prone knee flexion. Hip extension ROM improved with passive stretching in side lying. Patient demonstrated improved knee flexion and reduced hip hiking with gait pattern at end of session as compared to start.   OBJECTIVE IMPAIRMENTS: decreased activity tolerance, decreased balance, decreased endurance, difficulty walking, decreased ROM, decreased strength, increased edema, increased muscle spasms, and pain.   GOALS: Goals reviewed with patient? Yes  SHORT TERM GOALS: Target date: 09/01/2022  Pt will be independent with initial HEP Baseline: Goal status: INITIAL  LONG TERM GOALS: Target date: 09/29/2022  Pt will be independent with advanced HEP Baseline:  Goal status: INITIAL  2.  Pt will improve FOTO to >= 71 to demo improved functional mobility Baseline:  Goal status: INITIAL  3.  Pt will improve Lt knee ROM to 0-115 to improve ability to perform stairs Baseline:  Goal status: INITIAL  4.  Pt will improve 5x STS to <= 9 seconds to demo improved functional strength Baseline:  Goal status: INITIAL  5.  Pt will sleep x 6 hours without waking due to pain Baseline:  Goal status: INITIAL  PLAN:  PT FREQUENCY: 2x/week  PT DURATION: 6 weeks  PLANNED INTERVENTIONS: Therapeutic exercises, Therapeutic activity, Neuromuscular re-education, Balance training, Gait training, Patient/Family education, Self Care, Joint mobilization, Stair training, Aquatic Therapy, Dry Needling, Electrical stimulation, Cryotherapy, Moist heat, Taping, Vasopneumatic device, Ultrasound, Ionotophoresis '4mg'$ /ml Dexamethasone, Manual therapy, and  Re-evaluation  PLAN FOR NEXT SESSION: FOTO intake; Dynamic knee flexion, swing phase. Lt knee ROM and strength, balance, manual as needed   Hardin Negus, PTA 08/27/2022, 10:58 AM

## 2022-08-31 ENCOUNTER — Other Ambulatory Visit: Payer: Self-pay | Admitting: Physician Assistant

## 2022-08-31 MED ORDER — TRAMADOL HCL 50 MG PO TABS: 50.0000 mg | ORAL_TABLET | Freq: Four times a day (QID) | ORAL | 0 refills | Status: DC | PRN

## 2022-09-01 ENCOUNTER — Ambulatory Visit: Payer: BC Managed Care – PPO | Admitting: Physical Therapy

## 2022-09-01 ENCOUNTER — Encounter: Payer: Self-pay | Admitting: Physical Therapy

## 2022-09-01 DIAGNOSIS — R262 Difficulty in walking, not elsewhere classified: Secondary | ICD-10-CM

## 2022-09-01 DIAGNOSIS — M25562 Pain in left knee: Secondary | ICD-10-CM | POA: Diagnosis not present

## 2022-09-01 DIAGNOSIS — R600 Localized edema: Secondary | ICD-10-CM

## 2022-09-01 DIAGNOSIS — M6281 Muscle weakness (generalized): Secondary | ICD-10-CM

## 2022-09-01 NOTE — Therapy (Signed)
OUTPATIENT PHYSICAL THERAPY LOWER EXTREMITY TREATMENT   Patient Name: Krystal Kane MRN: [id number] DOB:05-01-1961, 62 y.o., female Today's Date: 09/01/2022  END OF SESSION:  PT End of Session - 09/01/22 1608     Visit Number 5    Number of Visits 12    Date for PT Re-Evaluation 09/29/22    PT Start Time 1526    PT Stop Time [id number]    PT Time Calculation (min) 38 min    Activity Tolerance Patient tolerated treatment well    Behavior During Therapy WFL for tasks assessed/performed              Past Medical History:  Diagnosis Date   Allergy    seasonal   Arthritis    osteoarthritis   Cancer ([place])    ductal ca in situ rt breast   Depression    Family history of adverse reaction to anesthesia    Father died from reaction to Anesthesia 1971   Fluid retention    Heart murmur    History of kidney stones    Hypertension    Personal history of radiation therapy 2003   Sweet's syndrome    Past Surgical History:  Procedure Laterality Date   ABDOMINAL HYSTERECTOMY     APPENDECTOMY     BREAST LUMPECTOMY Right 2003   BREAST SURGERY     rt lumpectomy   KNEE ARTHROSCOPY  02/2010   right   LAPAROSCOPIC APPENDECTOMY  10/2010   TOTAL KNEE ARTHROPLASTY Right 05/08/2016   Procedure: RIGHT TOTAL KNEE ARTHROPLASTY;  Surgeon: Mcarthur Rossetti, MD;  Location: [facility];  Service: Orthopedics;  Laterality: Right;   TOTAL KNEE ARTHROPLASTY Left 07/24/2022   Procedure: LEFT TOTAL KNEE ARTHROPLASTY;  Surgeon: Mcarthur Rossetti, MD;  Location: [facility];  Service: Orthopedics;  Laterality: Left;   Patient Active Problem List   Diagnosis Date Noted   Status post total left knee replacement 07/24/2022   Status post total right knee replacement 05/08/2016   Sweet's syndrome 09/15/2014   Anxiety 09/19/2011   Unilateral primary osteoarthritis, right knee 09/19/2011   History of kidney stones 09/19/2011   Hypertension 12/15/2010   Obesity 12/15/2010   Depression 12/15/2010   History  of breast cancer 12/15/2010    PCP: Elenor Quinones  REFERRING PROVIDER: Jean Rosenthal  REFERRING DIAG: Lt TKA  THERAPY DIAG:  Acute pain of left knee  Muscle weakness (generalized)  Difficulty in walking, not elsewhere classified  Localized edema  Rationale for Evaluation and Treatment: Rehabilitation  ONSET DATE: 07/24/2022  SUBJECTIVE:   SUBJECTIVE STATEMENT: Pt states she continues to have tightness. She states she has been trying to walk with more knee flexion  EVAL:  Pt is s/p Lt TKA 07/24/22. She was initially having HHPT and states her last visit was 1 week ago but she has been performing HEP. She states she still has pain and wakes up in the night due to pain. Once she takes meds she feels better. She is walking without a device. Pt is able to negotiate stairs to enter home and is able to live on 1 level. She states she feels her muscles are "tight" and she gets "knots" in her muscles which cause pain. Pt continues to manage pain with ice and pain meds.  PERTINENT HISTORY: Rt TKA 2017 PAIN:  Are you having pain? Yes: NPRS scale: 4/10 currently worse at night/10 Pain location: Lt knee Pain description: sore, achey Aggravating factors: standing and walking after prolonged sitting Relieving factors:  ice,meds  PRECAUTIONS: None  WEIGHT BEARING RESTRICTIONS: No  FALLS:  Has patient fallen in last 6 months? No  PATIENT GOALS: decrease pain, return to "normal" walking  NEXT MD VISIT: 09/15/22  OBJECTIVE:  (Measures in this section from initial evaluation unless otherwise noted) PATIENT SURVEYS:  FOTO 59  (eval) FOTO 63 (09/01/22)  EDEMA:  Mild edema Lt knee  PALPATION: Steri strips in place over incision Patella mobility WFL  LOWER EXTREMITY ROM:  Active ROM Right eval Left eval Left 09/01/22  Knee flexion  90 97  Knee extension  -6 -4   LOWER EXTREMITY MMT:  MMT Right eval Left eval  Knee flexion 4+ 4  Knee extension 4+ 4-    FUNCTIONAL TESTS:  5 times sit to stand: 10.9 seconds  GAIT: Distance walked: 100' Assistive device utilized: None Level of assistance: Complete Independence Comments: mild antalgic gait with decreased knee flexion on Lt   [facility] Adult PT Treatment:                                                DATE: 09/01/22 Therapeutic Exercise: Recumbent partial revolutions x 3 minutes for warm up Standing knee flexion stretch on step 10 x 10 seconds Stepping over noodle 2 x 10 fwd/bkwd, x 10 laterally Lt hip pull through red TB x 10 Lt HS curl without resistance standing x 15 AAROM heel slides 10 sec x 5 Quad set 5 x 5 sec Prone HS curl 5 sec hold x 10 SLR 2 x 10 straight and in ER Manual Therapy: Contract/relax in prone for knee flexion x 5 STM HS and quad  Gait: Gait x 2 laps focus on knee flexion Modalities: K tape to reduce swelling Lt hamstring    [facility] Adult PT Treatment:                                                DATE: 08/27/2022 Therapeutic Exercise: SAQ (black bolster) 10x5" AAROM heel slides w/strap 10x10" SLR (small ROM): parallel, ER, IR x10 each Prone: TKE 10x5" Knee flexion + 2-way light resistance from therapist x10 reps HS curls YTB --> no resistance Seated knee flexion stretch 10x10" S/L TFL stretch S/L passive hip flexor stretch Manual Therapy: STM/IASTM distal/laterals, ant tib, ITB/TFL    [facility] Adult PT Treatment:                                                DATE: 08/25/2022 Therapeutic Exercise: Recumbent bike (partial revolutions) x 3 min Prone: TKE 10x5" Knee flexion + 2-way light resistance from therapist x10 reps Supine: Quad set (discontinued d/t pain at lateral knee) HS/hip add stretches w/strap AAROM heel slides w/strap 5x10" Manual Therapy: Gentle patella mobs STM/IASTM distal/lateral/medial quads   PATIENT EDUCATION:  Education details: L leg step overs A/P for swing phase Person educated: Patient Education method: Consulting civil engineer,  Media planner, and Handouts Education comprehension: verbalized understanding and returned demonstration  HOME EXERCISE PROGRAM: Access Code: [email or web address] URL: [email or web address] Date: 08/25/2022 Prepared by: Helane Gunther  Exercises - Lateral Step Down  - 1 x daily - 7 x  weekly - 3 sets - 10 reps - Forward Step Down  - 1 x daily - 7 x weekly - 3 sets - 10 reps - Standing Knee Flexion Stretch on Step  - 1 x daily - 7 x weekly - 1 sets - 3 reps - 20-30 seconds hold - Standing Hamstring Curl with Resistance  - 1 x daily - 7 x weekly - 3 sets - 10 reps - Hip Abduction with Resistance Loop  - 1 x daily - 7 x weekly - 3 sets - 10 reps - Hip Extension with Resistance Loop  - 1 x daily - 7 x weekly - 3 sets - 10 reps - Supine ITB Stretch with Strap  - 2 x daily - 7 x weekly - 1 sets - 3-5 reps - 30 sec hold - Prone Quadriceps Set  - 1 x daily - 7 x weekly - 3 sets - 10 reps - 5 sec hold - Prone Terminal Knee Extension  - 1 x daily - 7 x weekly - 3 sets - 10 reps - 5 sec hold  ASSESSMENT:  CLINICAL IMPRESSION: Pt has improved knee flexion ROM by 5 degrees. Still limited by significant swelling Lt hamstring. Trial of K tape to reduce swelling   OBJECTIVE IMPAIRMENTS: decreased activity tolerance, decreased balance, decreased endurance, difficulty walking, decreased ROM, decreased strength, increased edema, increased muscle spasms, and pain.   GOALS: Goals reviewed with patient? Yes  SHORT TERM GOALS: Target date: 09/01/2022  Pt will be independent with initial HEP Baseline: Goal status: MET  LONG TERM GOALS: Target date: 09/29/2022  Pt will be independent with advanced HEP Baseline:  Goal status: INITIAL  2.  Pt will improve FOTO to >= 71 to demo improved functional mobility Baseline:  Goal status: IN PROGRESS  3.  Pt will improve Lt knee ROM to 0-115 to improve ability to perform stairs Baseline:  Goal status: INITIAL  4.  Pt will improve 5x STS to <= 9 seconds to  demo improved functional strength Baseline:  Goal status: INITIAL  5.  Pt will sleep x 6 hours without waking due to pain Baseline:  Goal status: INITIAL  PLAN:  PT FREQUENCY: 2x/week  PT DURATION: 6 weeks  PLANNED INTERVENTIONS: Therapeutic exercises, Therapeutic activity, Neuromuscular re-education, Balance training, Gait training, Patient/Family education, Self Care, Joint mobilization, Stair training, Aquatic Therapy, Dry Needling, Electrical stimulation, Cryotherapy, Moist heat, Taping, Vasopneumatic device, Ultrasound, Ionotophoresis '4mg'$ /ml Dexamethasone, Manual therapy, and Re-evaluation  PLAN FOR NEXT SESSION:  How was K tape? Dynamic knee flexion, swing phase. Lt knee ROM and strength, balance, manual as needed   Dalana Pfahler, PT 09/01/2022, 4:08 PM

## 2022-09-03 ENCOUNTER — Encounter: Payer: Self-pay | Admitting: Orthopaedic Surgery

## 2022-09-03 ENCOUNTER — Ambulatory Visit: Payer: BC Managed Care – PPO

## 2022-09-03 DIAGNOSIS — R600 Localized edema: Secondary | ICD-10-CM

## 2022-09-03 DIAGNOSIS — R262 Difficulty in walking, not elsewhere classified: Secondary | ICD-10-CM

## 2022-09-03 DIAGNOSIS — M25562 Pain in left knee: Secondary | ICD-10-CM

## 2022-09-03 DIAGNOSIS — M6281 Muscle weakness (generalized): Secondary | ICD-10-CM

## 2022-09-03 MED ORDER — TIZANIDINE HCL 4 MG PO TABS: 4.0000 mg | ORAL_TABLET | Freq: Four times a day (QID) | ORAL | 1 refills | Status: DC | PRN

## 2022-09-03 NOTE — Therapy (Signed)
OUTPATIENT PHYSICAL THERAPY LOWER EXTREMITY TREATMENT   Patient Name: Krystal Kane MRN: [id number] DOB:1960-08-06, 62 y.o., female Today's Date: 09/03/2022  END OF SESSION:  PT End of Session - 09/03/22 1021     Visit Number 6    Number of Visits 12    Date for PT Re-Evaluation 09/29/22    Authorization Type BCBS    PT Start Time 1017    PT Stop Time 1101    PT Time Calculation (min) 44 min    Activity Tolerance Patient tolerated treatment well    Behavior During Therapy WFL for tasks assessed/performed              Past Medical History:  Diagnosis Date   Allergy    seasonal   Arthritis    osteoarthritis   Cancer ([place])    ductal ca in situ rt breast   Depression    Family history of adverse reaction to anesthesia    Father died from reaction to Anesthesia 1971   Fluid retention    Heart murmur    History of kidney stones    Hypertension    Personal history of radiation therapy 2003   Sweet's syndrome    Past Surgical History:  Procedure Laterality Date   ABDOMINAL HYSTERECTOMY     APPENDECTOMY     BREAST LUMPECTOMY Right 2003   BREAST SURGERY     rt lumpectomy   KNEE ARTHROSCOPY  02/2010   right   LAPAROSCOPIC APPENDECTOMY  10/2010   TOTAL KNEE ARTHROPLASTY Right 05/08/2016   Procedure: RIGHT TOTAL KNEE ARTHROPLASTY;  Surgeon: Mcarthur Rossetti, MD;  Location: [facility];  Service: Orthopedics;  Laterality: Right;   TOTAL KNEE ARTHROPLASTY Left 07/24/2022   Procedure: LEFT TOTAL KNEE ARTHROPLASTY;  Surgeon: Mcarthur Rossetti, MD;  Location: [facility];  Service: Orthopedics;  Laterality: Left;   Patient Active Problem List   Diagnosis Date Noted   Status post total left knee replacement 07/24/2022   Status post total right knee replacement 05/08/2016   Sweet's syndrome 09/15/2014   Anxiety 09/19/2011   Unilateral primary osteoarthritis, right knee 09/19/2011   History of kidney stones 09/19/2011   Hypertension 12/15/2010   Obesity 12/15/2010    Depression 12/15/2010   History of breast cancer 12/15/2010    PCP: Elenor Quinones  REFERRING PROVIDER: Jean Rosenthal  REFERRING DIAG: Lt TKA  THERAPY DIAG:  Acute pain of left knee  Muscle weakness (generalized)  Difficulty in walking, not elsewhere classified  Localized edema  Rationale for Evaluation and Treatment: Rehabilitation  ONSET DATE: 07/24/2022  SUBJECTIVE:   SUBJECTIVE STATEMENT: Patient reports 4/10 pain in posterior knee today. Patient states she did not feel much difference with kinesiotape from last session.  EVAL:  Pt is s/p Lt TKA 07/24/22. She was initially having HHPT and states her last visit was 1 week ago but she has been performing HEP. She states she still has pain and wakes up in the night due to pain. Once she takes meds she feels better. She is walking without a device. Pt is able to negotiate stairs to enter home and is able to live on 1 level. She states she feels her muscles are "tight" and she gets "knots" in her muscles which cause pain. Pt continues to manage pain with ice and pain meds.  PERTINENT HISTORY: Rt TKA 2017 PAIN:  Are you having pain? Yes: NPRS scale: 4/10 currently worse at night/10 Pain location: Lt knee Pain description: sore, achey Aggravating factors:  standing and walking after prolonged sitting Relieving factors: ice,meds  PRECAUTIONS: None  WEIGHT BEARING RESTRICTIONS: No  FALLS:  Has patient fallen in last 6 months? No  PATIENT GOALS: decrease pain, return to "normal" walking  NEXT MD VISIT: 09/15/22  OBJECTIVE:  (Measures in this section from initial evaluation unless otherwise noted) PATIENT SURVEYS:  FOTO 59  (eval) FOTO 63 (09/01/22)  EDEMA:  Mild edema Lt knee  PALPATION: Steri strips in place over incision Patella mobility WFL  LOWER EXTREMITY ROM:  Active ROM Right eval Left eval Left 09/01/22  Knee flexion  90 97  Knee extension  -6 -4   LOWER EXTREMITY MMT:  MMT Right eval  Left eval  Knee flexion 4+ 4  Knee extension 4+ 4-   FUNCTIONAL TESTS:  5 times sit to stand: 10.9 seconds  GAIT: Distance walked: 100' Assistive device utilized: None Level of assistance: Complete Independence Comments: mild antalgic gait with decreased knee flexion on Lt   [facility] Adult PT Treatment:                                                DATE: 09/03/2022 Therapeutic Exercise: Recumbent bike partial revolutions x 5 min Supine AAROM heel slides w/strap 6x5" --> AROM heel slides after patella mobs Dynamic knee flexion stretch on top 6" step 10x5" A/P step overs" pink yoga block --> stacked black yoga blocks Standing hip abd & ext (blue loop band) x10 each Standing HS curls (L) 2x10 Standing HS curl + swing through high knee x10 Manual Therapy: Prone STM medial/lateral distal  hamstring Patella mobs all directions    [facility] Adult PT Treatment:                                                DATE: 09/01/22 Therapeutic Exercise: Recumbent partial revolutions x 3 minutes for warm up Standing knee flexion stretch on step 10 x 10 seconds Stepping over noodle 2 x 10 fwd/bkwd, x 10 laterally Lt hip pull through red TB x 10 Lt HS curl without resistance standing x 15 AAROM heel slides 10 sec x 5 Quad set 5 x 5 sec Prone HS curl 5 sec hold x 10 SLR 2 x 10 straight and in ER Manual Therapy: Contract/relax in prone for knee flexion x 5 STM HS and quad  Gait: Gait x 2 laps focus on knee flexion Modalities: K tape to reduce swelling Lt hamstring    PATIENT EDUCATION:  Education details: L leg step overs A/P for swing phase Person educated: Patient Education method: Consulting civil engineer, Media planner, and Handouts Education comprehension: verbalized understanding and returned demonstration  HOME EXERCISE PROGRAM: Access Code: [email or web address] URL: [email or web address] Date: 08/25/2022 Prepared by: Helane Gunther  Exercises - Lateral Step Down  - 1 x daily - 7 x weekly  - 3 sets - 10 reps - Forward Step Down  - 1 x daily - 7 x weekly - 3 sets - 10 reps - Standing Knee Flexion Stretch on Step  - 1 x daily - 7 x weekly - 1 sets - 3 reps - 20-30 seconds hold - Standing Hamstring Curl with Resistance  - 1 x daily - 7 x weekly - 3 sets - 10 reps - Hip  Abduction with Resistance Loop  - 1 x daily - 7 x weekly - 3 sets - 10 reps - Hip Extension with Resistance Loop  - 1 x daily - 7 x weekly - 3 sets - 10 reps - Supine ITB Stretch with Strap  - 2 x daily - 7 x weekly - 1 sets - 3-5 reps - 30 sec hold - Prone Quadriceps Set  - 1 x daily - 7 x weekly - 3 sets - 10 reps - 5 sec hold - Prone Terminal Knee Extension  - 1 x daily - 7 x weekly - 3 sets - 10 reps - 5 sec hold  ASSESSMENT:  CLINICAL IMPRESSION: Patient reported increased pain at lateral knee during AAROM heel slides; patella mobilizations performed and patient reported decrease in symptoms and demonstrated improved knee AROM. Swing phase mechanics continued with high knee stepping and hamstring curl/hip flexion dynamic combination.   OBJECTIVE IMPAIRMENTS: decreased activity tolerance, decreased balance, decreased endurance, difficulty walking, decreased ROM, decreased strength, increased edema, increased muscle spasms, and pain.   GOALS: Goals reviewed with patient? Yes  SHORT TERM GOALS: Target date: 09/01/2022  Pt will be independent with initial HEP Baseline: Goal status: MET  LONG TERM GOALS: Target date: 09/29/2022  Pt will be independent with advanced HEP Baseline:  Goal status: INITIAL  2.  Pt will improve FOTO to >= 71 to demo improved functional mobility Baseline:  Goal status: IN PROGRESS  3.  Pt will improve Lt knee ROM to 0-115 to improve ability to perform stairs Baseline:  Goal status: INITIAL  4.  Pt will improve 5x STS to <= 9 seconds to demo improved functional strength Baseline:  Goal status: INITIAL  5.  Pt will sleep x 6 hours without waking due to pain Baseline:  Goal  status: INITIAL  PLAN:  PT FREQUENCY: 2x/week  PT DURATION: 6 weeks  PLANNED INTERVENTIONS: Therapeutic exercises, Therapeutic activity, Neuromuscular re-education, Balance training, Gait training, Patient/Family education, Self Care, Joint mobilization, Stair training, Aquatic Therapy, Dry Needling, Electrical stimulation, Cryotherapy, Moist heat, Taping, Vasopneumatic device, Ultrasound, Ionotophoresis '4mg'$ /ml Dexamethasone, Manual therapy, and Re-evaluation  PLAN FOR NEXT SESSION: FOTO intake next visit. Dynamic knee flexion, improve swing phase. Progress L knee ROM and strength, balance, manual/patella mobs as needed   Hardin Negus, PTA 09/03/2022, 11:02 AM

## 2022-09-07 ENCOUNTER — Other Ambulatory Visit: Payer: Self-pay | Admitting: Orthopaedic Surgery

## 2022-09-07 MED ORDER — TRAMADOL HCL 50 MG PO TABS: 50.0000 mg | ORAL_TABLET | Freq: Four times a day (QID) | ORAL | 0 refills | Status: DC | PRN

## 2022-09-07 NOTE — Telephone Encounter (Signed)
From: Joesphine Bare To: Office of [place], [place] Sent: 09/07/2022 3:22 PM EDT Subject: Medication Renewal Request  Refills have been requested for the following medications:   traMADol (ULTRAM) 50 MG tablet [GILBERT CLARK]  Preferred pharmacy: [facility] [place], [address] Delivery method: [employer]

## 2022-09-08 ENCOUNTER — Ambulatory Visit: Payer: BC Managed Care – PPO

## 2022-09-08 DIAGNOSIS — M25562 Pain in left knee: Secondary | ICD-10-CM | POA: Diagnosis not present

## 2022-09-08 DIAGNOSIS — R600 Localized edema: Secondary | ICD-10-CM

## 2022-09-08 DIAGNOSIS — R262 Difficulty in walking, not elsewhere classified: Secondary | ICD-10-CM

## 2022-09-08 DIAGNOSIS — M6281 Muscle weakness (generalized): Secondary | ICD-10-CM

## 2022-09-08 NOTE — Therapy (Signed)
OUTPATIENT PHYSICAL THERAPY LOWER EXTREMITY TREATMENT   Patient Name: Krystal Kane MRN: [id number] DOB:Aug 04, 1960, 62 y.o., female Today's Date: 09/08/2022  END OF SESSION:  PT End of Session - 09/08/22 0917     Visit Number 7    Number of Visits 12    Date for PT Re-Evaluation 09/29/22    Authorization Type BCBS    PT Start Time 0917    PT Stop Time 1003    PT Time Calculation (min) 46 min    Activity Tolerance Patient tolerated treatment well    Behavior During Therapy WFL for tasks assessed/performed              Past Medical History:  Diagnosis Date   Allergy    seasonal   Arthritis    osteoarthritis   Cancer ([place])    ductal ca in situ rt breast   Depression    Family history of adverse reaction to anesthesia    Father died from reaction to Anesthesia 1971   Fluid retention    Heart murmur    History of kidney stones    Hypertension    Personal history of radiation therapy 2003   Sweet's syndrome    Past Surgical History:  Procedure Laterality Date   ABDOMINAL HYSTERECTOMY     APPENDECTOMY     BREAST LUMPECTOMY Right 2003   BREAST SURGERY     rt lumpectomy   KNEE ARTHROSCOPY  02/2010   right   LAPAROSCOPIC APPENDECTOMY  10/2010   TOTAL KNEE ARTHROPLASTY Right 05/08/2016   Procedure: RIGHT TOTAL KNEE ARTHROPLASTY;  Surgeon: Mcarthur Rossetti, MD;  Location: [facility];  Service: Orthopedics;  Laterality: Right;   TOTAL KNEE ARTHROPLASTY Left 07/24/2022   Procedure: LEFT TOTAL KNEE ARTHROPLASTY;  Surgeon: Mcarthur Rossetti, MD;  Location: [facility];  Service: Orthopedics;  Laterality: Left;   Patient Active Problem List   Diagnosis Date Noted   Status post total left knee replacement 07/24/2022   Status post total right knee replacement 05/08/2016   Sweet's syndrome 09/15/2014   Anxiety 09/19/2011   Unilateral primary osteoarthritis, right knee 09/19/2011   History of kidney stones 09/19/2011   Hypertension 12/15/2010   Obesity 12/15/2010    Depression 12/15/2010   History of breast cancer 12/15/2010    PCP: Elenor Quinones  REFERRING PROVIDER: Jean Rosenthal  REFERRING DIAG: Lt TKA  THERAPY DIAG:  Acute pain of left knee  Difficulty in walking, not elsewhere classified  Muscle weakness (generalized)  Localized edema  Rationale for Evaluation and Treatment: Rehabilitation  ONSET DATE: 07/24/2022  SUBJECTIVE:   SUBJECTIVE STATEMENT: Patient states her pain is much better today, reports 2/10 pain. Patient states the swelling is greater but thinks it is related to her being more active throughout her day. Patient states her calves feel more sore, thinks it is related to her being more conscious of her gait pattern. Patient states she is sleeping much better, states she has averaged 8 hours the last few nights.  EVAL:  Pt is s/p Lt TKA 07/24/22. She was initially having HHPT and states her last visit was 1 week ago but she has been performing HEP. She states she still has pain and wakes up in the night due to pain. Once she takes meds she feels better. She is walking without a device. Pt is able to negotiate stairs to enter home and is able to live on 1 level. She states she feels her muscles are "tight" and she gets "knots"  in her muscles which cause pain. Pt continues to manage pain with ice and pain meds.  PERTINENT HISTORY: Rt TKA 2017 PAIN:  Are you having pain? Yes: NPRS scale: 4/10 currently worse at night/10 Pain location: Lt knee Pain description: sore, achey Aggravating factors: standing and walking after prolonged sitting Relieving factors: ice,meds  PRECAUTIONS: None  WEIGHT BEARING RESTRICTIONS: No  FALLS:  Has patient fallen in last 6 months? No  PATIENT GOALS: decrease pain, return to "normal" walking  NEXT MD VISIT: 09/15/22  OBJECTIVE:  (Measures in this section from initial evaluation unless otherwise noted) PATIENT SURVEYS:  FOTO 59  (eval) FOTO 63 (09/01/22) FOTO 63  (09/08/22)  EDEMA:  Mild edema Lt knee  PALPATION: Steri strips in place over incision Patella mobility WFL  LOWER EXTREMITY ROM:  Active ROM Right eval Left eval Left 09/01/22  Knee flexion  90 97  Knee extension  -6 -4   LOWER EXTREMITY MMT:  MMT Right eval Left eval  Knee flexion 4+ 4  Knee extension 4+ 4-   FUNCTIONAL TESTS:  5 times sit to stand: 10.9 seconds  GAIT: Distance walked: 100' Assistive device utilized: None Level of assistance: Complete Independence Comments: mild antalgic gait with decreased knee flexion on Lt   [facility] Adult PT Treatment:                                                DATE: 09/08/2022 Therapeutic Exercise: Recumbent bike partial revolutions x 5 min Gastroc stretch on slant board 2x30" AAROM heel slides w/strap 10x10" L HS/ITB stretches w/strap 3x30" each SAQ 10x5"  LAQ 4#AW 10x5" S/L reverse clamshell (black FR b/w knees) x10 S/L clamshells yellow loop band STS x5 (11 sec) --> staggered stance STS x10 (L foot back) Manual Therapy: Patella mobs all directions STM distal ITB/lateral quad    [facility] Adult PT Treatment:                                                DATE: 09/03/2022 Therapeutic Exercise: Recumbent bike partial revolutions x 5 min Supine AAROM heel slides w/strap 6x5" --> AROM heel slides after patella mobs Dynamic knee flexion stretch on top 6" step 10x5" A/P step overs" pink yoga block --> stacked black yoga blocks Standing hip abd & ext (blue loop band) x10 each Standing HS curls (L) 2x10 Standing HS curl + swing through high knee x10 Manual Therapy: Prone STM medial/lateral distal  hamstring Patella mobs all directions   PATIENT EDUCATION:  Education details: Updated HEP Person educated: Patient Education method: Explanation, Demonstration, and Handouts Education comprehension: verbalized understanding and returned demonstration  HOME EXERCISE PROGRAM: Access Code: [email or web address] URL:  [email or web address] Date: 09/08/2022 Prepared by: Helane Gunther  Exercises - Lateral Step Down  - 1 x daily - 7 x weekly - 3 sets - 10 reps - Forward Step Down  - 1 x daily - 7 x weekly - 3 sets - 10 reps - Standing Knee Flexion Stretch on Step  - 1 x daily - 7 x weekly - 1 sets - 3 reps - 20-30 seconds hold - Standing Hamstring Curl with Resistance  - 1 x daily - 7 x weekly - 3 sets - 10 reps -  Hip Abduction with Resistance Loop  - 1 x daily - 7 x weekly - 3 sets - 10 reps - Hip Extension with Resistance Loop  - 1 x daily - 7 x weekly - 3 sets - 10 reps - Supine ITB Stretch with Strap  - 2 x daily - 7 x weekly - 1 sets - 3-5 reps - 30 sec hold - Prone Quadriceps Set  - 1 x daily - 7 x weekly - 3 sets - 10 reps - 5 sec hold - Prone Terminal Knee Extension  - 1 x daily - 7 x weekly - 3 sets - 10 reps - 5 sec hold - Clam with Resistance  - 1 x daily - 7 x weekly - 3 sets - 10 reps - Sidelying Reverse Clamshell with Resistance  - 1 x daily - 7 x weekly - 3 sets - 10 reps - Staggered Sit-to-Stand  - 1 x daily - 7 x weekly - 3 sets - 10 reps  ASSESSMENT:  CLINICAL IMPRESSION: Patient continues to have increased pain with initiation of knee extension during supine heel slides; patella mobilizations improved mobility and decreased pain symptoms with knee AROM mobility. Sit to stand activity performed in staggered stance to increase weight bearing in L LE.   OBJECTIVE IMPAIRMENTS: decreased activity tolerance, decreased balance, decreased endurance, difficulty walking, decreased ROM, decreased strength, increased edema, increased muscle spasms, and pain.   GOALS: Goals reviewed with patient? Yes  SHORT TERM GOALS: Target date: 09/01/2022  Pt will be independent with initial HEP Baseline: Goal status: MET  LONG TERM GOALS: Target date: 09/29/2022  Pt will be independent with advanced HEP Baseline:  Goal status: INITIAL  2.  Pt will improve FOTO to >= 71 to demo improved  functional mobility Baseline:  Goal status: IN PROGRESS  3.  Pt will improve Lt knee ROM to 0-115 to improve ability to perform stairs Baseline:  Goal status: IN PROGRESS   4.  Pt will improve 5x STS to <= 9 seconds to demo improved functional strength Baseline:  Goal status: IN PROGRESS (10.5 sec) on 09/08/22  5.  Pt will sleep x 6 hours without waking due to pain Baseline:  Goal status: MET on 09/08/22  PLAN:  PT FREQUENCY: 2x/week  PT DURATION: 6 weeks  PLANNED INTERVENTIONS: Therapeutic exercises, Therapeutic activity, Neuromuscular re-education, Balance training, Gait training, Patient/Family education, Self Care, Joint mobilization, Stair training, Aquatic Therapy, Dry Needling, Electrical stimulation, Cryotherapy, Moist heat, Taping, Vasopneumatic device, Ultrasound, Ionotophoresis 4mg /ml Dexamethasone, Manual therapy, and Re-evaluation  PLAN FOR NEXT SESSION: Dynamic knee flexion, improve swing phase. Progress L knee ROM and strength, balance, manual/patella mobs as needed   Hardin Negus, PTA 09/08/2022, 10:05 AM

## 2022-09-10 ENCOUNTER — Ambulatory Visit: Payer: BC Managed Care – PPO

## 2022-09-10 DIAGNOSIS — R262 Difficulty in walking, not elsewhere classified: Secondary | ICD-10-CM

## 2022-09-10 DIAGNOSIS — M6281 Muscle weakness (generalized): Secondary | ICD-10-CM

## 2022-09-10 DIAGNOSIS — M25562 Pain in left knee: Secondary | ICD-10-CM

## 2022-09-10 DIAGNOSIS — R600 Localized edema: Secondary | ICD-10-CM

## 2022-09-10 NOTE — Therapy (Signed)
OUTPATIENT PHYSICAL THERAPY LOWER EXTREMITY TREATMENT   Patient Name: Krystal Kane MRN: [id number] DOB:[birth year], 62 y.o., female Today's Date: 09/10/2022  END OF SESSION:  PT End of Session - 09/10/22 1536     Visit Number 8    Number of Visits 12    Date for PT Re-Evaluation 09/29/22    Authorization Type BC[employer]    PT Start Time 1535    PT Stop Time 1625    PT Time Calculation (min) 50 min    Activity Tolerance Patient tolerated treatment well    Behavior During Therapy WFL for tasks assessed/performed              Past Medical History:  Diagnosis Date   Allergy    seasonal   Arthritis    osteoarthritis   Cancer ([place])    ductal ca in situ rt breast   Depression    Family history of adverse reaction to anesthesia    Father died from reaction to Anesthesia 1971   Fluid retention    Heart murmur    History of kidney stones    Hypertension    Personal history of radiation therapy 2003   Sweet's syndrome    Past Surgical History:  Procedure Laterality Date   ABDOMINAL HYSTERECTOMY     APPENDECTOMY     BREAST LUMPECTOMY Right 2003   BREAST SURGERY     rt lumpectomy   KNEE ARTHROSCOPY  02/2010   right   LAPAROSCOPIC APPENDECTOMY  10/2010   TOTAL KNEE ARTHROPLASTY Right 05/08/2016   Procedure: RIGHT TOTAL KNEE ARTHROPLASTY;  Surgeon: Mcarthur Rossetti, MD;  Location: [facility];  Service: Orthopedics;  Laterality: Right;   TOTAL KNEE ARTHROPLASTY Left 07/24/2022   Procedure: LEFT TOTAL KNEE ARTHROPLASTY;  Surgeon: Mcarthur Rossetti, MD;  Location: [facility];  Service: Orthopedics;  Laterality: Left;   Patient Active Problem List   Diagnosis Date Noted   Status post total left knee replacement 07/24/2022   Status post total right knee replacement 05/08/2016   Sweet's syndrome 09/15/2014   Anxiety 09/19/2011   Unilateral primary osteoarthritis, right knee 09/19/2011   History of kidney stones 09/19/2011   Hypertension 12/15/2010   Obesity 12/15/2010    Depression 12/15/2010   History of breast cancer 12/15/2010    PCP: Elenor Quinones  REFERRING PROVIDER: Jean Rosenthal  REFERRING DIAG: Lt TKA  THERAPY DIAG:  Acute pain of left knee  Difficulty in walking, not elsewhere classified  Muscle weakness (generalized)  Localized edema  Rationale for Evaluation and Treatment: Rehabilitation  ONSET DATE: 07/24/2022  SUBJECTIVE:   SUBJECTIVE STATEMENT: Patient reports 2/10 pain in knee today, states she is very sore in shin. Patient states she slept really well Tuesday and then okay sleep last night; states overall her sleep has greatly improved.   EVAL:  Pt is s/p Lt TKA 07/24/22. She was initially having HHPT and states her last visit was 1 week ago but she has been performing HEP. She states she still has pain and wakes up in the night due to pain. Once she takes meds she feels better. She is walking without a device. Pt is able to negotiate stairs to enter home and is able to live on 1 level. She states she feels her muscles are "tight" and she gets "knots" in her muscles which cause pain. Pt continues to manage pain with ice and pain meds.  PERTINENT HISTORY: Rt TKA 2017 PAIN:  Are you having pain? Yes: NPRS scale: 4/10  currently worse at night/10 Pain location: Lt knee Pain description: sore, achey Aggravating factors: standing and walking after prolonged sitting Relieving factors: ice,meds  PRECAUTIONS: None  WEIGHT BEARING RESTRICTIONS: No  FALLS:  Has patient fallen in last 6 months? No  PATIENT GOALS: decrease pain, return to "normal" walking  NEXT MD VISIT: 09/15/22  OBJECTIVE:  (Measures in this section from initial evaluation unless otherwise noted) PATIENT SURVEYS:  FOTO 59  (eval) FOTO 63 (09/01/22) FOTO 63 (09/08/22)  EDEMA:  Mild edema Lt knee  PALPATION: Steri strips in place over incision Patella mobility WFL  LOWER EXTREMITY ROM:  Active ROM Right eval Left eval Left 09/01/22  Knee  flexion  90 97  Knee extension  -6 -4   LOWER EXTREMITY MMT:  MMT Right eval Left eval  Knee flexion 4+ 4  Knee extension 4+ 4-   FUNCTIONAL TESTS:  5 times sit to stand: 10.9 seconds  GAIT: Distance walked: 100' Assistive device utilized: None Level of assistance: Complete Independence Comments: mild antalgic gait with decreased knee flexion on Lt   [facility] Adult PT Treatment:                                                DATE: 09/10/2022 Therapeutic Exercise: Recumbent bike partial revolutions x 5 min Runner's lunge gastroc stretch Peroneal nerve glide LAQ 5#AW 2x10  S/L straight leg hip abd (foot on block) S/L straight leg rainbows (feet on blocks) High knee step over yoga blocks  STS (R foot propped on yoga block) Manual Therapy: Patella mobs all directions --> STM ant tib Modalities: Game ready, 34 degrees, light compression x 10 min   [facility] Adult PT Treatment:                                                DATE: 09/08/2022 Therapeutic Exercise: Recumbent bike partial revolutions x 5 min Gastroc stretch on slant board 2x30" AAROM heel slides w/strap 10x10" L HS/ITB stretches w/strap 3x30" each SAQ 10x5"  LAQ 4#AW 10x5" S/L reverse clamshell (black FR b/w knees) x10 S/L clamshells yellow loop band STS x5 (11 sec) --> staggered stance STS x10 (L foot back) Manual Therapy: Patella mobs all directions STM distal ITB/lateral quad   PATIENT EDUCATION:  Education details: Updated HEP Person educated: Patient Education method: Explanation, Demonstration, and Handouts Education comprehension: verbalized understanding and returned demonstration  HOME EXERCISE PROGRAM: Access Code: [email or web address] URL: [email or web address] Date: 09/08/2022 Prepared by: Helane Gunther  Exercises - Lateral Step Down  - 1 x daily - 7 x weekly - 3 sets - 10 reps - Forward Step Down  - 1 x daily - 7 x weekly - 3 sets - 10 reps - Standing Knee Flexion Stretch on Step  - 1 x  daily - 7 x weekly - 1 sets - 3 reps - 20-30 seconds hold - Standing Hamstring Curl with Resistance  - 1 x daily - 7 x weekly - 3 sets - 10 reps - Hip Abduction with Resistance Loop  - 1 x daily - 7 x weekly - 3 sets - 10 reps - Hip Extension with Resistance Loop  - 1 x daily - 7 x weekly - 3 sets - 10 reps -  Supine ITB Stretch with Strap  - 2 x daily - 7 x weekly - 1 sets - 3-5 reps - 30 sec hold - Prone Quadriceps Set  - 1 x daily - 7 x weekly - 3 sets - 10 reps - 5 sec hold - Prone Terminal Knee Extension  - 1 x daily - 7 x weekly - 3 sets - 10 reps - 5 sec hold - Clam with Resistance  - 1 x daily - 7 x weekly - 3 sets - 10 reps - Sidelying Reverse Clamshell with Resistance  - 1 x daily - 7 x weekly - 3 sets - 10 reps - Staggered Sit-to-Stand  - 1 x daily - 7 x weekly - 3 sets - 10 reps  ASSESSMENT:  CLINICAL IMPRESSION: Decreased knee pain reported after patella mobilizations, exhibiting increased knee AROM. Peroneal nerve glide performed to address tingling/tightness along anterior tib/great toe. Hip strengthening continued with focus on hip abd and extension. Sit to stand performed with increased weight bearing on R LE; patient able to complete with no exacerbation of symptoms.  OBJECTIVE IMPAIRMENTS: decreased activity tolerance, decreased balance, decreased endurance, difficulty walking, decreased ROM, decreased strength, increased edema, increased muscle spasms, and pain.   GOALS: Goals reviewed with patient? Yes  SHORT TERM GOALS: Target date: 09/01/2022  Pt will be independent with initial HEP Baseline: Goal status: MET  LONG TERM GOALS: Target date: 09/29/2022  Pt will be independent with advanced HEP Baseline:  Goal status: INITIAL  2.  Pt will improve FOTO to >= 71 to demo improved functional mobility Baseline:  Goal status: IN PROGRESS  3.  Pt will improve Lt knee ROM to 0-115 to improve ability to perform stairs Baseline:  Goal status: IN PROGRESS   4.  Pt will  improve 5x STS to <= 9 seconds to demo improved functional strength Baseline:  Goal status: IN PROGRESS (10.5 sec) on 09/08/22  5.  Pt will sleep x 6 hours without waking due to pain Baseline:  Goal status: MET on 09/08/22  PLAN:  PT FREQUENCY: 2x/week  PT DURATION: 6 weeks  PLANNED INTERVENTIONS: Therapeutic exercises, Therapeutic activity, Neuromuscular re-education, Balance training, Gait training, Patient/Family education, Self Care, Joint mobilization, Stair training, Aquatic Therapy, Dry Needling, Electrical stimulation, Cryotherapy, Moist heat, Taping, Vasopneumatic device, Ultrasound, Ionotophoresis 4mg /ml Dexamethasone, Manual therapy, and Re-evaluation  PLAN FOR NEXT SESSION: Dynamic knee flexion, improve swing phase. Progress L knee ROM and strength, balance, manual/patella mobs as needed   Hardin Negus, PTA 09/10/2022, 4:47 PM

## 2022-09-14 ENCOUNTER — Ambulatory Visit: Payer: BC Managed Care – PPO

## 2022-09-14 DIAGNOSIS — R600 Localized edema: Secondary | ICD-10-CM

## 2022-09-14 DIAGNOSIS — R262 Difficulty in walking, not elsewhere classified: Secondary | ICD-10-CM

## 2022-09-14 DIAGNOSIS — M6281 Muscle weakness (generalized): Secondary | ICD-10-CM

## 2022-09-14 DIAGNOSIS — M25562 Pain in left knee: Secondary | ICD-10-CM | POA: Diagnosis not present

## 2022-09-14 NOTE — Therapy (Signed)
OUTPATIENT PHYSICAL THERAPY LOWER EXTREMITY TREATMENT   Patient Name: Krystal Kane MRN: [id number] DOB:[birth year], 62 y.o., female Today's Date: 09/14/2022  END OF SESSION:  PT End of Session - 09/14/22 1017     Visit Number 9    Number of Visits 12    Date for PT Re-Evaluation 09/29/22    Authorization Type BCBS    PT Start Time 1018    PT Stop Time 1110    PT Time Calculation (min) 52 min    Activity Tolerance Patient tolerated treatment well    Behavior During Therapy WFL for tasks assessed/performed              Past Medical History:  Diagnosis Date   Allergy    seasonal   Arthritis    osteoarthritis   Cancer ([place])    ductal ca in situ rt breast   Depression    Family history of adverse reaction to anesthesia    Father died from reaction to Anesthesia 1971   Fluid retention    Heart murmur    History of kidney stones    Hypertension    Personal history of radiation therapy 2003   Sweet's syndrome    Past Surgical History:  Procedure Laterality Date   ABDOMINAL HYSTERECTOMY     APPENDECTOMY     BREAST LUMPECTOMY Right 2003   BREAST SURGERY     rt lumpectomy   KNEE ARTHROSCOPY  02/2010   right   LAPAROSCOPIC APPENDECTOMY  10/2010   TOTAL KNEE ARTHROPLASTY Right 05/08/2016   Procedure: RIGHT TOTAL KNEE ARTHROPLASTY;  Surgeon: Mcarthur Rossetti, MD;  Location: [facility];  Service: Orthopedics;  Laterality: Right;   TOTAL KNEE ARTHROPLASTY Left 07/24/2022   Procedure: LEFT TOTAL KNEE ARTHROPLASTY;  Surgeon: Mcarthur Rossetti, MD;  Location: [facility];  Service: Orthopedics;  Laterality: Left;   Patient Active Problem List   Diagnosis Date Noted   Status post total left knee replacement 07/24/2022   Status post total right knee replacement 05/08/2016   Sweet's syndrome 09/15/2014   Anxiety 09/19/2011   Unilateral primary osteoarthritis, right knee 09/19/2011   History of kidney stones 09/19/2011   Hypertension 12/15/2010   Obesity 12/15/2010    Depression 12/15/2010   History of breast cancer 12/15/2010    PCP: Elenor Quinones  REFERRING PROVIDER: Jean Rosenthal  REFERRING DIAG: Lt TKA  THERAPY DIAG:  Acute pain of left knee  Difficulty in walking, not elsewhere classified  Muscle weakness (generalized)  Localized edema  Rationale for Evaluation and Treatment: Rehabilitation  ONSET DATE: 07/24/2022  SUBJECTIVE:   SUBJECTIVE STATEMENT: Patient reports 2/10 pain today, states she did a lot more physical activity over the weekend and longer time standing. Patient states she has been icing and elevating her leg due to increased swelling with the prolonged standing.   EVAL:  Pt is s/p Lt TKA 07/24/22. She was initially having HHPT and states her last visit was 1 week ago but she has been performing HEP. She states she still has pain and wakes up in the night due to pain. Once she takes meds she feels better. She is walking without a device. Pt is able to negotiate stairs to enter home and is able to live on 1 level. She states she feels her muscles are "tight" and she gets "knots" in her muscles which cause pain. Pt continues to manage pain with ice and pain meds.  PERTINENT HISTORY: Rt TKA 2017 PAIN:  Are you having pain?  Yes: NPRS scale: 4/10 currently worse at night/10 Pain location: Lt knee Pain description: sore, achey Aggravating factors: standing and walking after prolonged sitting Relieving factors: ice,meds  PRECAUTIONS: None  WEIGHT BEARING RESTRICTIONS: No  FALLS:  Has patient fallen in last 6 months? No  PATIENT GOALS: decrease pain, return to "normal" walking  NEXT MD VISIT: 09/15/22  OBJECTIVE:  (Measures in this section from initial evaluation unless otherwise noted) PATIENT SURVEYS:  FOTO 59  (eval) FOTO 63 (09/01/22) FOTO 63 (09/08/22)  EDEMA:  Mild edema Lt knee  PALPATION: Steri strips in place over incision Patella mobility WFL  LOWER EXTREMITY ROM:  Active ROM Right eval  Left eval Left 09/01/22  Knee flexion  90 97  Knee extension  -6 -4   LOWER EXTREMITY MMT:  MMT Right eval Left eval  Knee flexion 4+ 4  Knee extension 4+ 4-   FUNCTIONAL TESTS:  5 times sit to stand: 10.9 seconds  GAIT: Distance walked: 100' Assistive device utilized: None Level of assistance: Complete Independence Comments: mild antalgic gait with decreased knee flexion on Lt   [facility] Adult PT Treatment:                                                DATE: 09/14/2022 Therapeutic Exercise: Recumbent bike partial revolutions x 5 min + subjective intake AAROM heel slides w/strap  Resisted side stepping GTB crossed at ankles Resisted diagonal stepping fwd/bkwd GTB crossed at ankles Bent over counter hip extension Standing HS curls (toe tap position) TKE press against ball in standing Gastroc stretch x30" LAQ (L) 4#AW parallel & ER 2x5x5" each  Manual Therapy: Patella mobs all directions --> STM ant tib Modalities: Game ready, 34 degrees, light compression x 10 min    [facility] Adult PT Treatment:                                                DATE: 09/10/2022 Therapeutic Exercise: Recumbent bike partial revolutions x 5 min Runner's lunge gastroc stretch Peroneal nerve glide LAQ 5#AW 2x10  S/L straight leg hip abd (foot on block) S/L straight leg rainbows (feet on blocks) High knee step over yoga blocks  STS (R foot propped on yoga block) Manual Therapy: Patella mobs all directions --> STM ant tib Modalities: Game ready, 34 degrees, light compression x 10 min   PATIENT EDUCATION:  Education details: Updated HEP Person educated: Patient Education method: Explanation, Demonstration, and Handouts Education comprehension: verbalized understanding and returned demonstration  HOME EXERCISE PROGRAM: Access Code: [email or web address] URL: [email or web address] Date: 09/08/2022 Prepared by: Helane Gunther  Exercises - Lateral Step Down  - 1 x daily - 7 x weekly - 3  sets - 10 reps - Forward Step Down  - 1 x daily - 7 x weekly - 3 sets - 10 reps - Standing Knee Flexion Stretch on Step  - 1 x daily - 7 x weekly - 1 sets - 3 reps - 20-30 seconds hold - Standing Hamstring Curl with Resistance  - 1 x daily - 7 x weekly - 3 sets - 10 reps - Hip Abduction with Resistance Loop  - 1 x daily - 7 x weekly - 3 sets - 10 reps - Hip Extension  with Resistance Loop  - 1 x daily - 7 x weekly - 3 sets - 10 reps - Supine ITB Stretch with Strap  - 2 x daily - 7 x weekly - 1 sets - 3-5 reps - 30 sec hold - Prone Quadriceps Set  - 1 x daily - 7 x weekly - 3 sets - 10 reps - 5 sec hold - Prone Terminal Knee Extension  - 1 x daily - 7 x weekly - 3 sets - 10 reps - 5 sec hold - Clam with Resistance  - 1 x daily - 7 x weekly - 3 sets - 10 reps - Sidelying Reverse Clamshell with Resistance  - 1 x daily - 7 x weekly - 3 sets - 10 reps - Staggered Sit-to-Stand  - 1 x daily - 7 x weekly - 3 sets - 10 reps  ASSESSMENT:  CLINICAL IMPRESSION: Patient continues to have increased pain at medial lower knee during active assist heel slides in supine; pain alleviated with patella mobilizations. Patient reported increase in medial knee pain during standing TKE exercise. External rotation added during LAQ to progress medial quad strength.   OBJECTIVE IMPAIRMENTS: decreased activity tolerance, decreased balance, decreased endurance, difficulty walking, decreased ROM, decreased strength, increased edema, increased muscle spasms, and pain.   GOALS: Goals reviewed with patient? Yes  SHORT TERM GOALS: Target date: 09/01/2022  Pt will be independent with initial HEP Baseline: Goal status: MET  LONG TERM GOALS: Target date: 09/29/2022  Pt will be independent with advanced HEP Baseline:  Goal status: INITIAL  2.  Pt will improve FOTO to >= 71 to demo improved functional mobility Baseline:  Goal status: IN PROGRESS  3.  Pt will improve Lt knee ROM to 0-115 to improve ability to perform  stairs Baseline:  Goal status: IN PROGRESS   4.  Pt will improve 5x STS to <= 9 seconds to demo improved functional strength Baseline:  Goal status: IN PROGRESS (10.5 sec) on 09/08/22  5.  Pt will sleep x 6 hours without waking due to pain Baseline:  Goal status: MET on 09/08/22  PLAN:  PT FREQUENCY: 2x/week  PT DURATION: 6 weeks  PLANNED INTERVENTIONS: Therapeutic exercises, Therapeutic activity, Neuromuscular re-education, Balance training, Gait training, Patient/Family education, Self Care, Joint mobilization, Stair training, Aquatic Therapy, Dry Needling, Electrical stimulation, Cryotherapy, Moist heat, Taping, Vasopneumatic device, Ultrasound, Ionotophoresis 4mg /ml Dexamethasone, Manual therapy, and Re-evaluation  PLAN FOR NEXT SESSION: Dynamic knee flexion, progress knee mobility and tolerance to increased weight bearing in SLS. Progress L knee ROM and strength, balance, manual/patella mobs as needed   Hardin Negus, PTA 09/14/2022, 12:03 PM

## 2022-09-15 ENCOUNTER — Encounter: Payer: Self-pay | Admitting: Orthopaedic Surgery

## 2022-09-15 ENCOUNTER — Ambulatory Visit (INDEPENDENT_AMBULATORY_CARE_PROVIDER_SITE_OTHER): Payer: BC Managed Care – PPO | Admitting: Orthopaedic Surgery

## 2022-09-15 ENCOUNTER — Other Ambulatory Visit: Payer: Self-pay | Admitting: Family Medicine

## 2022-09-15 DIAGNOSIS — Z1231 Encounter for screening mammogram for malignant neoplasm of breast: Secondary | ICD-10-CM

## 2022-09-15 DIAGNOSIS — Z96652 Presence of left artificial knee joint: Secondary | ICD-10-CM

## 2022-09-15 MED ORDER — TIZANIDINE HCL 4 MG PO TABS: 4.0000 mg | ORAL_TABLET | Freq: Three times a day (TID) | ORAL | 1 refills | Status: DC | PRN

## 2022-09-15 MED ORDER — TRAMADOL HCL 50 MG PO TABS: 50.0000 mg | ORAL_TABLET | Freq: Four times a day (QID) | ORAL | 0 refills | Status: DC | PRN

## 2022-09-15 NOTE — Progress Notes (Signed)
The patient is now 7 weeks status post a left total knee arthroplasty.  We have replaced her right knee in the past.  She reports that she is making progress and is improving her range of motion and strength.  I first examined her older right operative knee.  That total knee has full range of motion.  Her left knee today has almost full extension at this point and I can flex her to just past 90 degrees.  She said she has been able to get to about 105 or so in therapy and does not feel like she needs a manipulation.  She does need a refill of tramadol and Zanaflex.  She will continue to push herself through therapy and a home program as well.  From my standpoint, we will see her back in 3 months and will have a standing AP and lateral of her more recent left operative knee at that visit.  If she needs medications between now and then she knows to contact us.

## 2022-09-17 ENCOUNTER — Ambulatory Visit: Payer: BC Managed Care – PPO

## 2022-09-17 DIAGNOSIS — R600 Localized edema: Secondary | ICD-10-CM

## 2022-09-17 DIAGNOSIS — M6281 Muscle weakness (generalized): Secondary | ICD-10-CM

## 2022-09-17 DIAGNOSIS — M25562 Pain in left knee: Secondary | ICD-10-CM | POA: Diagnosis not present

## 2022-09-17 DIAGNOSIS — R262 Difficulty in walking, not elsewhere classified: Secondary | ICD-10-CM

## 2022-09-17 NOTE — Therapy (Signed)
OUTPATIENT PHYSICAL THERAPY LOWER EXTREMITY TREATMENT   Patient Name: Krystal Kane MRN: [id number] DOB:January 17, 1961, 62 y.o., female Today's Date: 09/17/2022  END OF SESSION:  PT End of Session - 09/17/22 1530     Visit Number 10    Number of Visits 12    Date for PT Re-Evaluation 09/29/22    Authorization Type [employer]    PT Start Time 1530    PT Stop Time 1615    PT Time Calculation (min) 45 min    Activity Tolerance Patient tolerated treatment well    Behavior During Therapy WFL for tasks assessed/performed              Past Medical History:  Diagnosis Date   Allergy    seasonal   Arthritis    osteoarthritis   Cancer ([place])    ductal ca in situ rt breast   Depression    Family history of adverse reaction to anesthesia    Father died from reaction to Anesthesia 1971   Fluid retention    Heart murmur    History of kidney stones    Hypertension    Personal history of radiation therapy 2003   Sweet's syndrome    Past Surgical History:  Procedure Laterality Date   ABDOMINAL HYSTERECTOMY     APPENDECTOMY     BREAST LUMPECTOMY Right 2003   BREAST SURGERY     rt lumpectomy   KNEE ARTHROSCOPY  02/2010   right   LAPAROSCOPIC APPENDECTOMY  10/2010   TOTAL KNEE ARTHROPLASTY Right 05/08/2016   Procedure: RIGHT TOTAL KNEE ARTHROPLASTY;  Surgeon: Mcarthur Rossetti, MD;  Location: [facility];  Service: Orthopedics;  Laterality: Right;   TOTAL KNEE ARTHROPLASTY Left 07/24/2022   Procedure: LEFT TOTAL KNEE ARTHROPLASTY;  Surgeon: Mcarthur Rossetti, MD;  Location: [facility];  Service: Orthopedics;  Laterality: Left;   Patient Active Problem List   Diagnosis Date Noted   Status post total left knee replacement 07/24/2022   Status post total right knee replacement 05/08/2016   Sweet's syndrome 09/15/2014   Anxiety 09/19/2011   Unilateral primary osteoarthritis, right knee 09/19/2011   History of kidney stones 09/19/2011   Hypertension 12/15/2010   Obesity 12/15/2010    Depression 12/15/2010   History of breast cancer 12/15/2010    PCP: Elenor Quinones  REFERRING PROVIDER: Jean Rosenthal  REFERRING DIAG: Lt TKA  THERAPY DIAG:  Acute pain of left knee  Difficulty in walking, not elsewhere classified  Muscle weakness (generalized)  Localized edema  Rationale for Evaluation and Treatment: Rehabilitation  ONSET DATE: 07/24/2022  SUBJECTIVE:   SUBJECTIVE STATEMENT: Patient reports she continued to have difficulty with bending knee easily, states she rode a stationary bike at gym for 20 minutes prior to therapy today.   EVAL:  Pt is s/p Lt TKA 07/24/22. She was initially having HHPT and states her last visit was 1 week ago but she has been performing HEP. She states she still has pain and wakes up in the night due to pain. Once she takes meds she feels better. She is walking without a device. Pt is able to negotiate stairs to enter home and is able to live on 1 level. She states she feels her muscles are "tight" and she gets "knots" in her muscles which cause pain. Pt continues to manage pain with ice and pain meds.  PERTINENT HISTORY: Rt TKA 2017 PAIN:  Are you having pain? Yes: NPRS scale: 4/10 currently worse at night/10 Pain location: Lt knee  Pain description: sore, achey Aggravating factors: standing and walking after prolonged sitting Relieving factors: ice,meds  PRECAUTIONS: None  WEIGHT BEARING RESTRICTIONS: No  FALLS:  Has patient fallen in last 6 months? No  PATIENT GOALS: decrease pain, return to "normal" walking  NEXT MD VISIT: 09/15/22  OBJECTIVE:  (Measures in this section from initial evaluation unless otherwise noted) PATIENT SURVEYS:  FOTO 59  (eval) FOTO 63 (09/01/22) FOTO 63 (09/08/22)  EDEMA:  Mild edema Lt knee  PALPATION: Steri strips in place over incision Patella mobility WFL  LOWER EXTREMITY ROM:  Active ROM Right eval Left eval Left 09/01/22 Left 09/17/22   Knee flexion  90 97 104   Knee  extension  -6 -4    LOWER EXTREMITY MMT:  MMT Right eval Left eval  Knee flexion 4+ 4  Knee extension 4+ 4-   FUNCTIONAL TESTS:  5 times sit to stand: 10.9 seconds  GAIT: Distance walked: 100' Assistive device utilized: None Level of assistance: Complete Independence Comments: mild antalgic gait with decreased knee flexion on Lt   [facility] Adult PT Treatment:                                                DATE: 09/17/2022 Therapeutic Exercise: Standing high knee step over stacked yoga blocks Supine AAROM heel slides with strap 10x10" Seated LAQ 5#AW 3x10x5" Seated passive foot hang with 5#AW (knee traction) SLR in ER 10x5" Supine HS/ITB stretches w/strap 3x30" each Standing squats x10 Staggered stance squat (R forward) x10 L knee taps on airex/stepper (mat able support) Lateral heel tap down 4" step --> 6" step Gastroc/soleus stretch  SLB 2x30" Thomas stretch (R) 2x30" Manual Therapy: Patella mobs all directions --> STM ant tib Scar tissue massage along incision     [facility] Adult PT Treatment:                                                DATE: 09/14/2022 Therapeutic Exercise: Recumbent bike partial revolutions x 5 min + subjective intake AAROM heel slides w/strap  Resisted side stepping GTB crossed at ankles Resisted diagonal stepping fwd/bkwd GTB crossed at ankles Bent over counter hip extension Standing HS curls (toe tap position) TKE press against ball in standing Gastroc stretch x30" LAQ (L) 4#AW parallel & ER 2x5x5" each  Manual Therapy: Patella mobs all directions --> STM ant tib Modalities: Game ready, 34 degrees, light compression x 10 min    [facility] Adult PT Treatment:                                                DATE: 09/10/2022 Therapeutic Exercise: Recumbent bike partial revolutions x 5 min Runner's lunge gastroc stretch Peroneal nerve glide LAQ 5#AW 2x10  S/L straight leg hip abd (foot on block) S/L straight leg rainbows (feet on blocks) High  knee step over yoga blocks  STS (R foot propped on yoga block) Manual Therapy: Patella mobs all directions --> STM ant tib Modalities: Game ready, 34 degrees, light compression x 10 min   PATIENT EDUCATION:  Education details: Updated HEP Person educated:  Patient Education method: Explanation, Demonstration, and Handouts Education comprehension: verbalized understanding and returned demonstration  HOME EXERCISE PROGRAM: Access Code: [email or web address] URL: [email or web address] Date: 09/08/2022 Prepared by: Helane Gunther  Exercises - Lateral Step Down  - 1 x daily - 7 x weekly - 3 sets - 10 reps - Forward Step Down  - 1 x daily - 7 x weekly - 3 sets - 10 reps - Standing Knee Flexion Stretch on Step  - 1 x daily - 7 x weekly - 1 sets - 3 reps - 20-30 seconds hold - Standing Hamstring Curl with Resistance  - 1 x daily - 7 x weekly - 3 sets - 10 reps - Hip Abduction with Resistance Loop  - 1 x daily - 7 x weekly - 3 sets - 10 reps - Hip Extension with Resistance Loop  - 1 x daily - 7 x weekly - 3 sets - 10 reps - Supine ITB Stretch with Strap  - 2 x daily - 7 x weekly - 1 sets - 3-5 reps - 30 sec hold - Prone Quadriceps Set  - 1 x daily - 7 x weekly - 3 sets - 10 reps - 5 sec hold - Prone Terminal Knee Extension  - 1 x daily - 7 x weekly - 3 sets - 10 reps - 5 sec hold - Clam with Resistance  - 1 x daily - 7 x weekly - 3 sets - 10 reps - Sidelying Reverse Clamshell with Resistance  - 1 x daily - 7 x weekly - 3 sets - 10 reps - Staggered Sit-to-Stand  - 1 x daily - 7 x weekly - 3 sets - 10 reps  ASSESSMENT:  CLINICAL IMPRESSION: Improved weight bearing on L LE demonstrated during squat variations. Patient demonstrated improved tolerance with progression of functional strengthening and single leg exercises.   OBJECTIVE IMPAIRMENTS: decreased activity tolerance, decreased balance, decreased endurance, difficulty walking, decreased ROM, decreased strength, increased edema,  increased muscle spasms, and pain.   GOALS: Goals reviewed with patient? Yes  SHORT TERM GOALS: Target date: 09/01/2022  Pt will be independent with initial HEP Baseline: Goal status: MET  LONG TERM GOALS: Target date: 09/29/2022  Pt will be independent with advanced HEP Baseline:  Goal status: INITIAL  2.  Pt will improve FOTO to >= 71 to demo improved functional mobility Baseline:  Goal status: IN PROGRESS  3.  Pt will improve Lt knee ROM to 0-115 to improve ability to perform stairs Baseline:  Goal status: IN PROGRESS   4.  Pt will improve 5x STS to <= 9 seconds to demo improved functional strength Baseline:  Goal status: IN PROGRESS (10.5 sec) on 09/08/22  5.  Pt will sleep x 6 hours without waking due to pain Baseline:  Goal status: MET on 09/08/22  PLAN:  PT FREQUENCY: 2x/week  PT DURATION: 6 weeks  PLANNED INTERVENTIONS: Therapeutic exercises, Therapeutic activity, Neuromuscular re-education, Balance training, Gait training, Patient/Family education, Self Care, Joint mobilization, Stair training, Aquatic Therapy, Dry Needling, Electrical stimulation, Cryotherapy, Moist heat, Taping, Vasopneumatic device, Ultrasound, Ionotophoresis 4mg /ml Dexamethasone, Manual therapy, and Re-evaluation  PLAN FOR NEXT SESSION: Progress dynamic balance, functional knee flexion, SLB/SLS exercises   Hardin Negus, PTA 09/17/2022, 4:17 PM

## 2022-09-22 ENCOUNTER — Other Ambulatory Visit: Payer: Self-pay | Admitting: Orthopaedic Surgery

## 2022-09-22 ENCOUNTER — Ambulatory Visit: Payer: BC Managed Care – PPO | Attending: Orthopaedic Surgery | Admitting: Physical Therapy

## 2022-09-22 ENCOUNTER — Encounter: Payer: Self-pay | Admitting: Physical Therapy

## 2022-09-22 DIAGNOSIS — M6281 Muscle weakness (generalized): Secondary | ICD-10-CM

## 2022-09-22 DIAGNOSIS — M25562 Pain in left knee: Secondary | ICD-10-CM

## 2022-09-22 DIAGNOSIS — R600 Localized edema: Secondary | ICD-10-CM

## 2022-09-22 DIAGNOSIS — R262 Difficulty in walking, not elsewhere classified: Secondary | ICD-10-CM

## 2022-09-22 MED ORDER — TRAMADOL HCL 50 MG PO TABS: 50.0000 mg | ORAL_TABLET | Freq: Four times a day (QID) | ORAL | 0 refills | Status: DC | PRN

## 2022-09-22 NOTE — Therapy (Signed)
OUTPATIENT PHYSICAL THERAPY LOWER EXTREMITY TREATMENT   Patient Name: BARI MOZIE MRN: [id number] DOB:January 22, 1961, 62 y.o., female Today's Date: 09/22/2022  END OF SESSION:  PT End of Session - 09/22/22 1558     Visit Number 11    Number of Visits 12    Date for PT Re-Evaluation 09/29/22    PT Start Time [id number]    PT Stop Time 1610    PT Time Calculation (min) 40 min    Activity Tolerance Patient tolerated treatment well    Behavior During Therapy [facility] for tasks assessed/performed               Past Medical History:  Diagnosis Date   Allergy    seasonal   Arthritis    osteoarthritis   Cancer    ductal ca in situ rt breast   Depression    Family history of adverse reaction to anesthesia    Father died from reaction to Anesthesia 1971   Fluid retention    Heart murmur    History of kidney stones    Hypertension    Personal history of radiation therapy 2003   Sweet's syndrome    Past Surgical History:  Procedure Laterality Date   ABDOMINAL HYSTERECTOMY     APPENDECTOMY     BREAST LUMPECTOMY Right 2003   BREAST SURGERY     rt lumpectomy   KNEE ARTHROSCOPY  02/2010   right   LAPAROSCOPIC APPENDECTOMY  10/2010   TOTAL KNEE ARTHROPLASTY Right 05/08/2016   Procedure: RIGHT TOTAL KNEE ARTHROPLASTY;  Surgeon: Mcarthur Rossetti, MD;  Location: [facility];  Service: Orthopedics;  Laterality: Right;   TOTAL KNEE ARTHROPLASTY Left 07/24/2022   Procedure: LEFT TOTAL KNEE ARTHROPLASTY;  Surgeon: Mcarthur Rossetti, MD;  Location: [facility];  Service: Orthopedics;  Laterality: Left;   Patient Active Problem List   Diagnosis Date Noted   Status post total left knee replacement 07/24/2022   Status post total right knee replacement 05/08/2016   Sweet's syndrome 09/15/2014   Anxiety 09/19/2011   Unilateral primary osteoarthritis, right knee 09/19/2011   History of kidney stones 09/19/2011   Hypertension 12/15/2010   Obesity 12/15/2010   Depression 12/15/2010   History of  breast cancer 12/15/2010    PCP: Elenor Quinones  REFERRING PROVIDER: Jean Rosenthal  REFERRING DIAG: Lt TKA  THERAPY DIAG:  Acute pain of left knee  Difficulty in walking, not elsewhere classified  Muscle weakness (generalized)  Localized edema  Rationale for Evaluation and Treatment: Rehabilitation  ONSET DATE: 07/24/2022  SUBJECTIVE:   SUBJECTIVE STATEMENT: Patient states she doesn't have much pain today. She has been riding the bike at the gym  EVAL:  Pt is s/p Lt TKA 07/24/22. She was initially having HHPT and states her last visit was 1 week ago but she has been performing HEP. She states she still has pain and wakes up in the night due to pain. Once she takes meds she feels better. She is walking without a device. Pt is able to negotiate stairs to enter home and is able to live on 1 level. She states she feels her muscles are "tight" and she gets "knots" in her muscles which cause pain. Pt continues to manage pain with ice and pain meds.  PERTINENT HISTORY: Rt TKA 2017 PAIN:  Are you having pain? Yes: NPRS scale: 2/10 currently worse at night/10 Pain location: Lt knee Pain description: sore, achey Aggravating factors: standing and walking after prolonged sitting Relieving factors: ice,meds  PRECAUTIONS: None  WEIGHT BEARING RESTRICTIONS: No  FALLS:  Has patient fallen in last 6 months? No  PATIENT GOALS: decrease pain, return to "normal" walking  NEXT MD VISIT: 09/15/22  OBJECTIVE:  (Measures in this section from initial evaluation unless otherwise noted) PATIENT SURVEYS:  FOTO 59  (eval) FOTO 63 (09/01/22) FOTO 63 (09/08/22)  EDEMA:  Mild edema Lt knee  PALPATION: Steri strips in place over incision Patella mobility WFL  LOWER EXTREMITY ROM:  Active ROM Right eval Left eval Left 09/01/22 Left 09/17/22   Knee flexion  90 97 104   Knee extension  -6 -4    LOWER EXTREMITY MMT:  MMT Right eval Left eval  Knee flexion 4+ 4  Knee  extension 4+ 4-   FUNCTIONAL TESTS:  5 times sit to stand: 10.9 seconds  GAIT: Distance walked: 100' Assistive device utilized: None Level of assistance: Complete Independence Comments: mild antalgic gait with decreased knee flexion on Lt  [facility] Adult PT Treatment:                                                DATE: 09/22/22 Therapeutic Exercise: Recumbent bike partial revolutions x 5 min Lateral tap down 6'' step 2 x 10 with light UE support Seated heel slides focus on knee flexion ROM Squat tap 2 x 10 Standing HS curl x 10, then 2 x 10 3# Leg press 140# 2 x 10 DL, then 2 x 10 65# SL SLS 2 x 30 sec Manual Therapy: PROM knee flexion as tolerated  Modalities: Vaso x 10 min, 34 degrees, light pressure   [facility] Adult PT Treatment:                                                DATE: 09/17/2022 Therapeutic Exercise: Standing high knee step over stacked yoga blocks Supine AAROM heel slides with strap 10x10" Seated LAQ 5#AW 3x10x5" Seated passive foot hang with 5#AW (knee traction) SLR in ER 10x5" Supine HS/ITB stretches w/strap 3x30" each Standing squats x10 Staggered stance squat (R forward) x10 L knee taps on airex/stepper (mat able support) Lateral heel tap down 4" step --> 6" step Gastroc/soleus stretch  SLB 2x30" Thomas stretch (R) 2x30" Manual Therapy: Patella mobs all directions --> STM ant tib Scar tissue massage along incision     [facility] Adult PT Treatment:                                                DATE: 09/14/2022 Therapeutic Exercise: Recumbent bike partial revolutions x 5 min + subjective intake AAROM heel slides w/strap  Resisted side stepping GTB crossed at ankles Resisted diagonal stepping fwd/bkwd GTB crossed at ankles Bent over counter hip extension Standing HS curls (toe tap position) TKE press against ball in standing Gastroc stretch x30" LAQ (L) 4#AW parallel & ER 2x5x5" each  Manual Therapy: Patella mobs all directions --> STM ant  tib Modalities: Game ready, 34 degrees, light compression x 10 min   PATIENT EDUCATION:  Education details: Updated HEP Person educated: Patient Education method: Consulting civil engineer, Media planner, and Handouts Education  comprehension: verbalized understanding and returned demonstration  HOME EXERCISE PROGRAM: Access Code: [email or web address] URL: [email or web address] Date: 09/08/2022 Prepared by: Helane Gunther  Exercises - Lateral Step Down  - 1 x daily - 7 x weekly - 3 sets - 10 reps - Forward Step Down  - 1 x daily - 7 x weekly - 3 sets - 10 reps - Standing Knee Flexion Stretch on Step  - 1 x daily - 7 x weekly - 1 sets - 3 reps - 20-30 seconds hold - Standing Hamstring Curl with Resistance  - 1 x daily - 7 x weekly - 3 sets - 10 reps - Hip Abduction with Resistance Loop  - 1 x daily - 7 x weekly - 3 sets - 10 reps - Hip Extension with Resistance Loop  - 1 x daily - 7 x weekly - 3 sets - 10 reps - Supine ITB Stretch with Strap  - 2 x daily - 7 x weekly - 1 sets - 3-5 reps - 30 sec hold - Prone Quadriceps Set  - 1 x daily - 7 x weekly - 3 sets - 10 reps - 5 sec hold - Prone Terminal Knee Extension  - 1 x daily - 7 x weekly - 3 sets - 10 reps - 5 sec hold - Clam with Resistance  - 1 x daily - 7 x weekly - 3 sets - 10 reps - Sidelying Reverse Clamshell with Resistance  - 1 x daily - 7 x weekly - 3 sets - 10 reps - Staggered Sit-to-Stand  - 1 x daily - 7 x weekly - 3 sets - 10 reps  ASSESSMENT:  CLINICAL IMPRESSION: Focused on knee flexion PROM and AROM today. Pt able to achieve 108 degrees passively at end of session.   OBJECTIVE IMPAIRMENTS: decreased activity tolerance, decreased balance, decreased endurance, difficulty walking, decreased ROM, decreased strength, increased edema, increased muscle spasms, and pain.   GOALS: Goals reviewed with patient? Yes  SHORT TERM GOALS: Target date: 09/01/2022  Pt will be independent with initial HEP Baseline: Goal status: MET  LONG  TERM GOALS: Target date: 09/29/2022  Pt will be independent with advanced HEP Baseline:  Goal status: INITIAL  2.  Pt will improve FOTO to >= 71 to demo improved functional mobility Baseline:  Goal status: IN PROGRESS  3.  Pt will improve Lt knee ROM to 0-115 to improve ability to perform stairs Baseline:  Goal status: IN PROGRESS   4.  Pt will improve 5x STS to <= 9 seconds to demo improved functional strength Baseline:  Goal status: IN PROGRESS (10.5 sec) on 09/08/22  5.  Pt will sleep x 6 hours without waking due to pain Baseline:  Goal status: MET on 09/08/22  PLAN:  PT FREQUENCY: 2x/week  PT DURATION: 6 weeks  PLANNED INTERVENTIONS: Therapeutic exercises, Therapeutic activity, Neuromuscular re-education, Balance training, Gait training, Patient/Family education, Self Care, Joint mobilization, Stair training, Aquatic Therapy, Dry Needling, Electrical stimulation, Cryotherapy, Moist heat, Taping, Vasopneumatic device, Ultrasound, Ionotophoresis 4mg /ml Dexamethasone, Manual therapy, and Re-evaluation  PLAN FOR NEXT SESSION: RECERT! Progress dynamic balance, functional knee flexion, SLB/SLS exercises   Alwyn Cordner, PT 09/22/2022, 3:59 PM

## 2022-09-24 ENCOUNTER — Ambulatory Visit: Payer: BC Managed Care – PPO

## 2022-09-24 DIAGNOSIS — R600 Localized edema: Secondary | ICD-10-CM

## 2022-09-24 DIAGNOSIS — M6281 Muscle weakness (generalized): Secondary | ICD-10-CM

## 2022-09-24 DIAGNOSIS — M25562 Pain in left knee: Secondary | ICD-10-CM | POA: Diagnosis not present

## 2022-09-24 DIAGNOSIS — R262 Difficulty in walking, not elsewhere classified: Secondary | ICD-10-CM

## 2022-09-24 NOTE — Therapy (Addendum)
OUTPATIENT PHYSICAL THERAPY LOWER EXTREMITY TREATMENT AND RECERTIFICATION   Patient Name: Krystal CarlsMary G Behnke MRN: [id number] DOB:1961-01-01, 62 y.o., female Today's Date: 09/25/2022  END OF SESSION:  PT End of Session - 09/24/22 1534     Visit Number 12    Number of Visits 12    Date for PT Re-Evaluation 11/05/22    Authorization Type BCBS    PT Start Time 1535    PT Stop Time 1625    PT Time Calculation (min) 50 min    Activity Tolerance Patient tolerated treatment well    Behavior During Therapy WFL for tasks assessed/performed               Past Medical History:  Diagnosis Date   Allergy    seasonal   Arthritis    osteoarthritis   Cancer    ductal ca in situ rt breast   Depression    Family history of adverse reaction to anesthesia    Father died from reaction to Anesthesia 1971   Fluid retention    Heart murmur    History of kidney stones    Hypertension    Personal history of radiation therapy 2003   Sweet's syndrome    Past Surgical History:  Procedure Laterality Date   ABDOMINAL HYSTERECTOMY     APPENDECTOMY     BREAST LUMPECTOMY Right 2003   BREAST SURGERY     rt lumpectomy   KNEE ARTHROSCOPY  02/2010   right   LAPAROSCOPIC APPENDECTOMY  10/2010   TOTAL KNEE ARTHROPLASTY Right 05/08/2016   Procedure: RIGHT TOTAL KNEE ARTHROPLASTY;  Surgeon: Kathryne Hitchhristopher Y Blackman, MD;  Location: [facility];  Service: Orthopedics;  Laterality: Right;   TOTAL KNEE ARTHROPLASTY Left 07/24/2022   Procedure: LEFT TOTAL KNEE ARTHROPLASTY;  Surgeon: Kathryne HitchBlackman, Christopher Y, MD;  Location: [facility];  Service: Orthopedics;  Laterality: Left;   Patient Active Problem List   Diagnosis Date Noted   Status post total left knee replacement 07/24/2022   Status post total right knee replacement 05/08/2016   Sweet's syndrome 09/15/2014   Anxiety 09/19/2011   Unilateral primary osteoarthritis, right knee 09/19/2011   History of kidney stones 09/19/2011   Hypertension 12/15/2010   Obesity  12/15/2010   Depression 12/15/2010   History of breast cancer 12/15/2010    PCP: Pricilla Holmyter-Brown, Sherry  REFERRING PROVIDER: Doneen PoissonBlackman, Christopher  REFERRING DIAG: Lt TKA  THERAPY DIAG:  Acute pain of left knee  Difficulty in walking, not elsewhere classified  Muscle weakness (generalized)  Localized edema  Rationale for Evaluation and Treatment: Rehabilitation  ONSET DATE: 07/24/2022  SUBJECTIVE:   SUBJECTIVE STATEMENT: Patient reports her knee feels very tight today, states she was sore after last visit.   EVAL:  Pt is s/p Lt TKA 07/24/22. She was initially having HHPT and states her last visit was 1 week ago but she has been performing HEP. She states she still has pain and wakes up in the night due to pain. Once she takes meds she feels better. She is walking without a device. Pt is able to negotiate stairs to enter home and is able to live on 1 level. She states she feels her muscles are "tight" and she gets "knots" in her muscles which cause pain. Pt continues to manage pain with ice and pain meds.  PERTINENT HISTORY: Rt TKA 2017 PAIN:  Are you having pain? Yes: NPRS scale: 2/10 currently worse at night/10 Pain location: Lt knee Pain description: sore, achey Aggravating factors: standing and walking  after prolonged sitting Relieving factors: ice,meds  PRECAUTIONS: None  WEIGHT BEARING RESTRICTIONS: No  FALLS:  Has patient fallen in last 6 months? No  PATIENT GOALS: decrease pain, return to "normal" walking  NEXT MD VISIT: 09/15/22  OBJECTIVE:  (Measures in this section from initial evaluation unless otherwise noted) PATIENT SURVEYS:  FOTO 59  (eval) FOTO 67 (09/24/22)  EDEMA:  Mild edema Lt knee  PALPATION: Steri strips in place over incision [facility]ella [facility]  LOWER EXTREMITY ROM:  Active ROM Right eval Left eval Left 09/01/22 Left 09/17/22  Left 09/24/22  Knee flexion  90 97 104  106  Knee extension  -6 -4  -2   LOWER EXTREMITY MMT:  MMT  Right eval Left eval Left 09/24/22  Knee flexion 4+ 4 5  Knee extension 4+ 4- 5   FUNCTIONAL TESTS:  5 times sit to stand: 10.9 seconds  GAIT: Distance walked: 100' Assistive device utilized: None Level of assistance: Complete Independence Comments: mild antalgic gait with decreased knee flexion on Lt   [facility] Adult PT Treatment:                                                DATE: 09/24/2022 Therapeutic Exercise: Recumbent bike partial revolutions x 5 min AAROM heel slides with strap  L HS stretch w/strap 3x30" AROM knee measurements Squats with seat tap on mat table  STS with R foot propped on yoga block --> 10#KB overhead press in standing Manual Therapy: STM anterior/medial/lateral knee complex Modalities: Game Ready L knee    [facility] Adult PT Treatment:                                                DATE: 09/22/22 Therapeutic Exercise: Recumbent bike partial revolutions x 5 min Lateral tap down 6'' step 2 x 10 with light UE support Seated heel slides focus on knee flexion ROM Squat tap 2 x 10 Standing HS curl x 10, then 2 x 10 3# Leg press 140# 2 x 10 DL, then 2 x 10 81#65# SL SLS 2 x 30 sec Manual Therapy: PROM knee flexion as tolerated  Modalities: Vaso x 10 min, 34 degrees, light pressure   [facility] Adult PT Treatment:                                                DATE: 09/17/2022 Therapeutic Exercise: Standing high knee step over stacked yoga blocks Supine AAROM heel slides with strap 10x10" Seated LAQ 5#AW 3x10x5" Seated passive foot hang with 5#AW (knee traction) SLR in ER 10x5" Supine HS/ITB stretches w/strap 3x30" each Standing squats x10 Staggered stance squat (R forward) x10 L knee taps on airex/stepper (mat able support) Lateral heel tap down 4" step --> 6" step Gastroc/soleus stretch  SLB 2x30" Thomas stretch (R) 2x30" Manual Therapy: Patella mobs all directions --> STM ant tib Scar tissue massage along incision    PATIENT EDUCATION:  Education  details: Updated HEP Person educated: Patient Education method: Explanation, Demonstration, and Handouts Education comprehension: verbalized understanding and returned demonstration  HOME EXERCISE PROGRAM: Access Code:  [email or web address] URL: [email or web address] Date: 09/08/2022 Prepared by: Carlynn Herald  Exercises - Lateral Step Down  - 1 x daily - 7 x weekly - 3 sets - 10 reps - Forward Step Down  - 1 x daily - 7 x weekly - 3 sets - 10 reps - Standing Knee Flexion Stretch on Step  - 1 x daily - 7 x weekly - 1 sets - 3 reps - 20-30 seconds hold - Standing Hamstring Curl with Resistance  - 1 x daily - 7 x weekly - 3 sets - 10 reps - Hip Abduction with Resistance Loop  - 1 x daily - 7 x weekly - 3 sets - 10 reps - Hip Extension with Resistance Loop  - 1 x daily - 7 x weekly - 3 sets - 10 reps - Supine ITB Stretch with Strap  - 2 x daily - 7 x weekly - 1 sets - 3-5 reps - 30 sec hold - Prone Quadriceps Set  - 1 x daily - 7 x weekly - 3 sets - 10 reps - 5 sec hold - Prone Terminal Knee Extension  - 1 x daily - 7 x weekly - 3 sets - 10 reps - 5 sec hold - Clam with Resistance  - 1 x daily - 7 x weekly - 3 sets - 10 reps - Sidelying Reverse Clamshell with Resistance  - 1 x daily - 7 x weekly - 3 sets - 10 reps - Staggered Sit-to-Stand  - 1 x daily - 7 x weekly - 3 sets - 10 reps  ASSESSMENT:  CLINICAL IMPRESSION: Patient demonstrates good knee strength on L as compared to initial evaluation. Patient is making good progress with knee AROM; patient continues to have increased pain at end-range of active knee flexion during heel slides. Increased weight bearing noted on L LE during squat variations. Patient will continue to benefit from skilled therapy to address knee strength and ROM deficits.   OBJECTIVE IMPAIRMENTS: decreased activity tolerance, decreased balance, decreased endurance, difficulty walking, decreased ROM, decreased strength, increased edema, increased muscle spasms, and  pain.   GOALS: Goals reviewed with patient? Yes  SHORT TERM GOALS: Target date: 09/01/2022  Pt will be independent with initial HEP Baseline: Goal status: MET  LONG TERM GOALS: Target date: 11/05/2022    Pt will be independent with advanced HEP Baseline:  Goal status: MET  2.  Pt will improve FOTO to >= 71 to demo improved functional mobility Baseline:  Goal status: IN PROGRESS (67%)  3.  Pt will improve Lt knee ROM to 0-115 to improve ability to perform stairs Baseline:  Goal status: IN PROGRESS (see above)  4.  Pt will improve 5x STS to <= 9 seconds to demo improved functional strength Baseline:  Goal status: MET (8 sec)  5.  Pt will sleep x 6 hours without waking due to pain Baseline:  Goal status: IN PROGRESS (5 hours sleep w/o pain)  PLAN:  PT FREQUENCY: 2x/week  PT DURATION: 6 weeks  PLANNED INTERVENTIONS: Therapeutic exercises, Therapeutic activity, Neuromuscular re-education, Balance training, Gait training, Patient/Family education, Self Care, Joint mobilization, Stair training, Aquatic Therapy, Dry Needling, Electrical stimulation, Cryotherapy, Moist heat, Taping, Vasopneumatic device, Ultrasound, Ionotophoresis 4mg /ml Dexamethasone, Manual therapy, and Re-evaluation  PLAN FOR NEXT SESSION: Progress dynamic balance, functional knee flexion, SLB/SLS exercises; game ready as needed for swelling   DONAWERTH,KAREN, PT 09/25/2022, 8:12 AM

## 2022-09-25 NOTE — Addendum Note (Signed)
Addended by: Leone Brand on: 09/25/2022 08:13 AM   Modules accepted: Orders

## 2022-09-28 ENCOUNTER — Ambulatory Visit: Payer: BC Managed Care – PPO

## 2022-09-28 ENCOUNTER — Other Ambulatory Visit: Payer: Self-pay | Admitting: Orthopaedic Surgery

## 2022-09-28 DIAGNOSIS — R600 Localized edema: Secondary | ICD-10-CM

## 2022-09-28 DIAGNOSIS — R262 Difficulty in walking, not elsewhere classified: Secondary | ICD-10-CM

## 2022-09-28 DIAGNOSIS — M25562 Pain in left knee: Secondary | ICD-10-CM | POA: Diagnosis not present

## 2022-09-28 DIAGNOSIS — M6281 Muscle weakness (generalized): Secondary | ICD-10-CM

## 2022-09-28 MED ORDER — TRAMADOL HCL 50 MG PO TABS: 50.0000 mg | ORAL_TABLET | Freq: Four times a day (QID) | ORAL | 0 refills | Status: DC | PRN

## 2022-09-28 NOTE — Therapy (Signed)
OUTPATIENT PHYSICAL THERAPY LOWER EXTREMITY TREATMENT   Patient Name: Macario CarlsMary G Sickles MRN: [id number] DOB:May 11, 1961, 62 y.o., female Today's Date: 09/28/2022  END OF SESSION:  PT End of Session - 09/28/22 1532     Visit Number 13    Number of Visits 13    Date for PT Re-Evaluation 11/05/22    Authorization Type BCBS    Authorization - Visit Number 13    Authorization - Number of Visits 23    PT Start Time 1530    PT Stop Time 1620    PT Time Calculation (min) 50 min    Activity Tolerance Patient tolerated treatment well    Behavior During Therapy WFL for tasks assessed/performed               Past Medical History:  Diagnosis Date   Allergy    seasonal   Arthritis    osteoarthritis   Cancer    ductal ca in situ rt breast   Depression    Family history of adverse reaction to anesthesia    Father died from reaction to Anesthesia 1971   Fluid retention    Heart murmur    History of kidney stones    Hypertension    Personal history of radiation therapy 2003   Sweet's syndrome    Past Surgical History:  Procedure Laterality Date   ABDOMINAL HYSTERECTOMY     APPENDECTOMY     BREAST LUMPECTOMY Right 2003   BREAST SURGERY     rt lumpectomy   KNEE ARTHROSCOPY  02/2010   right   LAPAROSCOPIC APPENDECTOMY  10/2010   TOTAL KNEE ARTHROPLASTY Right 05/08/2016   Procedure: RIGHT TOTAL KNEE ARTHROPLASTY;  Surgeon: Kathryne Hitchhristopher Y Blackman, MD;  Location: [facility];  Service: Orthopedics;  Laterality: Right;   TOTAL KNEE ARTHROPLASTY Left 07/24/2022   Procedure: LEFT TOTAL KNEE ARTHROPLASTY;  Surgeon: Kathryne HitchBlackman, Christopher Y, MD;  Location: [facility];  Service: Orthopedics;  Laterality: Left;   Patient Active Problem List   Diagnosis Date Noted   Status post total left knee replacement 07/24/2022   Status post total right knee replacement 05/08/2016   Sweet's syndrome 09/15/2014   Anxiety 09/19/2011   Unilateral primary osteoarthritis, right knee 09/19/2011   History of kidney  stones 09/19/2011   Hypertension 12/15/2010   Obesity 12/15/2010   Depression 12/15/2010   History of breast cancer 12/15/2010    PCP: Pricilla Holmyter-Brown, Sherry  REFERRING PROVIDER: Doneen PoissonBlackman, Christopher  REFERRING DIAG: Lt TKA  THERAPY DIAG:  Acute pain of left knee  Difficulty in walking, not elsewhere classified  Muscle weakness (generalized)  Localized edema  Rationale for Evaluation and Treatment: Rehabilitation  ONSET DATE: 07/24/2022  SUBJECTIVE:   SUBJECTIVE STATEMENT: Patient reports her knee is feeling sore today due to sitting for long period at desk. Patient states she wakes up and her knee feels "twisted".   EVAL:  Pt is s/p Lt TKA 07/24/22. She was initially having HHPT and states her last visit was 1 week ago but she has been performing HEP. She states she still has pain and wakes up in the night due to pain. Once she takes meds she feels better. She is walking without a device. Pt is able to negotiate stairs to enter home and is able to live on 1 level. She states she feels her muscles are "tight" and she gets "knots" in her muscles which cause pain. Pt continues to manage pain with ice and pain meds.  PERTINENT HISTORY: Rt TKA 2017 PAIN:  Are you having pain? Yes: NPRS scale: 2/10 currently worse at night/10 Pain location: Lt knee Pain description: sore, achey Aggravating factors: standing and walking after prolonged sitting Relieving factors: ice,meds  PRECAUTIONS: None  WEIGHT BEARING RESTRICTIONS: No  FALLS:  Has patient fallen in last 6 months? No  PATIENT GOALS: decrease pain, return to "normal" walking  NEXT MD VISIT: July 2024  OBJECTIVE:  (Measures in this section from initial evaluation unless otherwise noted) PATIENT SURVEYS:  FOTO 59  (eval) FOTO 67 (09/24/22)  EDEMA:  Mild edema Lt knee  PALPATION: Steri strips in place over incision Patella mobility [facility]  LOWER EXTREMITY ROM:  Active ROM Right eval Left eval Left 09/01/22  Left 09/17/22  Left 09/24/22  Knee flexion  90 97 104  106  Knee extension  -6 -4  -2   LOWER EXTREMITY MMT:  MMT Right eval Left eval Left 09/24/22  Knee flexion 4+ 4 5  Knee extension 4+ 4- 5   FUNCTIONAL TESTS:  5 times sit to stand: 10.9 seconds  GAIT: Distance walked: 100' Assistive device utilized: None Level of assistance: Complete Independence Comments: mild antalgic gait with decreased knee flexion on Lt   [facility] Adult PT Treatment:                                                DATE: 09/28/2022 Therapeutic Exercise: Recumbent bike partial --> full revolutions Seated knee flexion stretch Seated HS stretch (foot propped on stool) 3x30" Prone L quad stretch w/strap 3x30" SLB stable ground Airex: dynamic L SLS + R stepping side/back --> L SLB 3x30" Tandem walking Modalities: Game Ready L knee, 34 degrees, med compression x 10 min   [facility] Adult PT Treatment:                                                DATE: 09/24/2022 Therapeutic Exercise: Recumbent bike partial revolutions x 5 min AAROM heel slides with strap  L HS stretch w/strap 3x30" AROM knee measurements Squats with seat tap on mat table  STS with R foot propped on yoga block --> 10#KB overhead press in standing Manual Therapy: STM anterior/medial/lateral knee complex Modalities: Game Ready L knee   PATIENT EDUCATION:  Education details: Sleeping with pillow underneath feet/shins Person educated: Patient Education method: Explanation, Demonstration, and Handouts Education comprehension: verbalized understanding and returned demonstration  HOME EXERCISE PROGRAM: Access Code: [email or web address] URL: [email or web address] Date: 09/28/2022 Prepared by: Carlynn Herald  Exercises - Lateral Step Down  - 1 x daily - 7 x weekly - 3 sets - 10 reps - Forward Step Down  - 1 x daily - 7 x weekly - 3 sets - 10 reps - Standing Knee Flexion Stretch on Step  - 1 x daily - 7 x weekly - 1 sets - 3 reps - 20-30  seconds hold - Standing Hamstring Curl with Resistance  - 1 x daily - 7 x weekly - 3 sets - 10 reps - Hip Abduction with Resistance Loop  - 1 x daily - 7 x weekly - 3 sets - 10 reps - Hip Extension with Resistance Loop  - 1 x daily - 7 x weekly - 3 sets - 10 reps - Supine ITB  Stretch with Strap  - 2 x daily - 7 x weekly - 1 sets - 3-5 reps - 30 sec hold - Prone Quadriceps Set  - 1 x daily - 7 x weekly - 3 sets - 10 reps - 5 sec hold - Prone Terminal Knee Extension  - 1 x daily - 7 x weekly - 3 sets - 10 reps - 5 sec hold - Clam with Resistance  - 1 x daily - 7 x weekly - 3 sets - 10 reps - Sidelying Reverse Clamshell with Resistance  - 1 x daily - 7 x weekly - 3 sets - 10 reps - Staggered Sit-to-Stand  - 1 x daily - 7 x weekly - 3 sets - 10 reps - Seated Knee Flexion Stretch  - 1 x daily - 7 x weekly - 1 sets - 10 reps - 10 sec hold - Prone Quadriceps Stretch with Strap  - 1 x daily - 7 x weekly - 3 sets - 10 reps  ASSESSMENT:  CLINICAL IMPRESSION:  Discussion with patient on sleeping with pillow underneath feet/shins to support legs and decrease increased hip IR/ER while sleeping. Knee stability challenged with single leg balance on compliant surface; patient reported increased stiffness with prolonged knee extension in weight bearing.   OBJECTIVE IMPAIRMENTS: decreased activity tolerance, decreased balance, decreased endurance, difficulty walking, decreased ROM, decreased strength, increased edema, increased muscle spasms, and pain.   GOALS: Goals reviewed with patient? Yes  SHORT TERM GOALS: Target date: 09/01/2022  Pt will be independent with initial HEP Baseline: Goal status: MET  LONG TERM GOALS: Target date: 11/05/2022  Pt will be independent with advanced HEP Baseline:  Goal status: MET  2.  Pt will improve FOTO to >= 71 to demo improved functional mobility Baseline:  Goal status: IN PROGRESS (67%)  3.  Pt will improve Lt knee ROM to 0-115 to improve ability to perform  stairs Baseline:  Goal status: IN PROGRESS (see above)  4.  Pt will improve 5x STS to <= 9 seconds to demo improved functional strength Baseline:  Goal status: MET (8 sec)  5.  Pt will sleep x 6 hours without waking due to pain Baseline:  Goal status: IN PROGRESS (5 hours sleep w/o pain)  PLAN:  PT FREQUENCY: 2x/week  PT DURATION: 6 weeks  PLANNED INTERVENTIONS: Therapeutic exercises, Therapeutic activity, Neuromuscular re-education, Balance training, Gait training, Patient/Family education, Self Care, Joint mobilization, Stair training, Aquatic Therapy, Dry Needling, Electrical stimulation, Cryotherapy, Moist heat, Taping, Vasopneumatic device, Ultrasound, Ionotophoresis 4mg /ml Dexamethasone, Manual therapy, and Re-evaluation  PLAN FOR NEXT SESSION: Follow up on slepeing with pillow underneath feet/shins. Check knee AROM measurements. Progress dynamic balance, functional knee flexion, SLB/SLS exercises; game ready as needed for swelling   Sanjuana Mae, PTA 09/28/2022, 4:20 PM

## 2022-09-30 ENCOUNTER — Ambulatory Visit: Payer: BC Managed Care – PPO

## 2022-10-02 ENCOUNTER — Encounter: Payer: Self-pay | Admitting: Orthopaedic Surgery

## 2022-10-02 ENCOUNTER — Other Ambulatory Visit: Payer: Self-pay

## 2022-10-02 MED ORDER — TIZANIDINE HCL 4 MG PO TABS: 4.0000 mg | ORAL_TABLET | Freq: Three times a day (TID) | ORAL | 1 refills | Status: DC | PRN

## 2022-10-05 ENCOUNTER — Other Ambulatory Visit: Payer: Self-pay | Admitting: Orthopaedic Surgery

## 2022-10-05 MED ORDER — TRAMADOL HCL 50 MG PO TABS: 50.0000 mg | ORAL_TABLET | Freq: Four times a day (QID) | ORAL | 0 refills | Status: DC | PRN

## 2022-10-07 ENCOUNTER — Ambulatory Visit: Payer: BC Managed Care – PPO

## 2022-10-07 DIAGNOSIS — M25562 Pain in left knee: Secondary | ICD-10-CM | POA: Diagnosis not present

## 2022-10-07 DIAGNOSIS — R262 Difficulty in walking, not elsewhere classified: Secondary | ICD-10-CM

## 2022-10-07 DIAGNOSIS — R600 Localized edema: Secondary | ICD-10-CM

## 2022-10-07 DIAGNOSIS — M6281 Muscle weakness (generalized): Secondary | ICD-10-CM

## 2022-10-07 NOTE — Therapy (Signed)
OUTPATIENT PHYSICAL THERAPY LOWER EXTREMITY TREATMENT   Patient Name: Krystal Kane MRN: [id number] DOB:11/12/1960, 62 y.o., female Today's Date: 10/07/2022  END OF SESSION:  PT End of Session - 10/07/22 0935     Visit Number 14    Date for PT Re-Evaluation 11/05/22    Authorization Type BCBS    Authorization Time Period 30 VISITS PER YEAR, 7 HAVE BEEN USED  08/18/2022    Authorization - Visit Number 14    Authorization - Number of Visits 23    PT Start Time 0933    PT Stop Time 1018    PT Time Calculation (min) 45 min    Activity Tolerance Patient tolerated treatment well    Behavior During Therapy WFL for tasks assessed/performed               Past Medical History:  Diagnosis Date   Allergy    seasonal   Arthritis    osteoarthritis   Cancer    ductal ca in situ rt breast   Depression    Family history of adverse reaction to anesthesia    Father died from reaction to Anesthesia 1971   Fluid retention    Heart murmur    History of kidney stones    Hypertension    Personal history of radiation therapy 2003   Sweet's syndrome    Past Surgical History:  Procedure Laterality Date   ABDOMINAL HYSTERECTOMY     APPENDECTOMY     BREAST LUMPECTOMY Right 2003   BREAST SURGERY     rt lumpectomy   KNEE ARTHROSCOPY  02/2010   right   LAPAROSCOPIC APPENDECTOMY  10/2010   TOTAL KNEE ARTHROPLASTY Right 05/08/2016   Procedure: RIGHT TOTAL KNEE ARTHROPLASTY;  Surgeon: Kathryne Hitch, MD;  Location: [facility];  Service: Orthopedics;  Laterality: Right;   TOTAL KNEE ARTHROPLASTY Left 07/24/2022   Procedure: LEFT TOTAL KNEE ARTHROPLASTY;  Surgeon: Kathryne Hitch, MD;  Location: [facility];  Service: Orthopedics;  Laterality: Left;   Patient Active Problem List   Diagnosis Date Noted   Status post total left knee replacement 07/24/2022   Status post total right knee replacement 05/08/2016   Sweet's syndrome 09/15/2014   Anxiety 09/19/2011   Unilateral primary  osteoarthritis, right knee 09/19/2011   History of kidney stones 09/19/2011   Hypertension 12/15/2010   Obesity 12/15/2010   Depression 12/15/2010   History of breast cancer 12/15/2010    PCP: Pricilla Holm  REFERRING PROVIDER: Doneen Poisson  REFERRING DIAG: Lt TKA  THERAPY DIAG:  Acute pain of left knee  Difficulty in walking, not elsewhere classified  Muscle weakness (generalized)  Localized edema  Rationale for Evaluation and Treatment: Rehabilitation  ONSET DATE: 07/24/2022  SUBJECTIVE:   SUBJECTIVE STATEMENT: Patient reports she made it to the gym once but has been busy with work. Patient states she is able to sleep 6-7 hours before she wakes up with 7-8/10 pain; pain increases to 5/10 as day progresses - pain is alleviated with elevating leg and taking pain medication.    EVAL:  Pt is s/p Lt TKA 07/24/22. She was initially having HHPT and states her last visit was 1 week ago but she has been performing HEP. She states she still has pain and wakes up in the night due to pain. Once she takes meds she feels better. She is walking without a device. Pt is able to negotiate stairs to enter home and is able to live on 1 level. She states  she feels her muscles are "tight" and she gets "knots" in her muscles which cause pain. Pt continues to manage pain with ice and pain meds.  PERTINENT HISTORY: Rt TKA 2017 PAIN:  Are you having pain? Yes: NPRS scale: 2/10 currently worse at night/10 Pain location: Lt knee Pain description: sore, achey Aggravating factors: standing and walking after prolonged sitting Relieving factors: ice,meds  PRECAUTIONS: None  WEIGHT BEARING RESTRICTIONS: No  FALLS:  Has patient fallen in last 6 months? No  PATIENT GOALS: decrease pain, return to "normal" walking  NEXT MD VISIT: July 2024  OBJECTIVE:  (Measures in this section from initial evaluation unless otherwise noted) PATIENT SURVEYS:  FOTO 59  (eval) FOTO 67  (09/24/22)  EDEMA:  Mild edema Lt knee  PALPATION: Steri strips in place over incision Patella mobility [facility]  LOWER EXTREMITY ROM:  Active ROM Right eval Left eval Left 09/01/22 Left 09/17/22  Left 09/24/22 Left 10/07/22  Knee flexion  90 97 104  106 108  Knee extension  -6 -4  -2    LOWER EXTREMITY MMT:  MMT Right eval Left eval Left 09/24/22  Knee flexion 4+ 4 5  Knee extension 4+ 4- 5   FUNCTIONAL TESTS:  5 times sit to stand: 10.9 seconds  GAIT: Distance walked: 100' Assistive device utilized: None Level of assistance: Complete Independence Comments: mild antalgic gait with decreased knee flexion on Lt   [facility] Adult PT Treatment:                                                DATE: 10/07/2022 Therapeutic Exercise: Recumbent bike L3 x 5 min AAROM heel slides 15x10" Supine HS stretch 2x30" Seated knee flexion stretch 10x5" Knee flexion stretch lunge on stairs Lateral step ups (step down R, step up L) Step down 4"-6" step Standing HS curls 3#AW 2x10 Standing hip ext 3#AW 2x10 B Slow front & side high knee marching --> added opposite knee taps Supine glute stretch   [facility] Adult PT Treatment:                                                DATE: 09/28/2022 Therapeutic Exercise: Recumbent bike partial --> full revolutions Seated knee flexion stretch Seated HS stretch (foot propped on stool) 3x30" Prone L quad stretch w/strap 3x30" SLB stable ground Airex: dynamic L SLS + R stepping side/back --> L SLB 3x30" Tandem walking Modalities: Game Ready L knee, 34 degrees, med compression x 10 min   PATIENT EDUCATION:  Education details: Sleeping with pillow underneath feet/shins Person educated: Patient Education method: Explanation, Demonstration, and Handouts Education comprehension: verbalized understanding and returned demonstration  HOME EXERCISE PROGRAM: Access Code: [email or web address] URL: [email or web address] Date: 10/07/2022 Prepared by: Carlynn Herald  Exercises - Lateral Step Down  - 1 x daily - 7 x weekly - 3 sets - 10 reps - Forward Step Down  - 1 x daily - 7 x weekly - 3 sets - 10 reps - Standing Knee Flexion Stretch on Step  - 1 x daily - 7 x weekly - 1 sets - 3 reps - 20-30 seconds hold - Standing Hamstring Curl with Resistance  - 1 x daily - 7 x weekly - 3 sets -  10 reps - Hip Abduction with Resistance Loop  - 1 x daily - 7 x weekly - 3 sets - 10 reps - Hip Extension with Resistance Loop  - 1 x daily - 7 x weekly - 3 sets - 10 reps - Supine ITB Stretch with Strap  - 2 x daily - 7 x weekly - 1 sets - 3-5 reps - 30 sec hold - Prone Quadriceps Set  - 1 x daily - 7 x weekly - 3 sets - 10 reps - 5 sec hold - Prone Terminal Knee Extension  - 1 x daily - 7 x weekly - 3 sets - 10 reps - 5 sec hold - Clam with Resistance  - 1 x daily - 7 x weekly - 3 sets - 10 reps - Sidelying Reverse Clamshell with Resistance  - 1 x daily - 7 x weekly - 3 sets - 10 reps - Staggered Sit-to-Stand  - 1 x daily - 7 x weekly - 3 sets - 10 reps - Seated Knee Flexion Stretch  - 1 x daily - 7 x weekly - 1 sets - 10 reps - 10 sec hold - Prone Quadriceps Stretch with Strap  - 1 x daily - 7 x weekly - 3 sets - 10 reps - Wall Squat with Ball between Knees  - 1 x daily - 7 x weekly - 3 sets - 10 reps  ASSESSMENT:  CLINICAL IMPRESSION:  Mild improvement measured with knee flexion AROM (see above). Step downs progressed to higher step with no increase in pain. Dynamic balance and single leg stability challenged with slow marching in frontal and sagittal planes; mild postural sway noted.   OBJECTIVE IMPAIRMENTS: decreased activity tolerance, decreased balance, decreased endurance, difficulty walking, decreased ROM, decreased strength, increased edema, increased muscle spasms, and pain.   GOALS: Goals reviewed with patient? Yes  SHORT TERM GOALS: Target date: 09/01/2022  Pt will be independent with initial HEP Baseline: Goal status: MET  LONG TERM GOALS:  Target date: 11/05/2022  Pt will be independent with advanced HEP Baseline:  Goal status: MET  2.  Pt will improve FOTO to >= 71 to demo improved functional mobility Baseline:  Goal status: IN PROGRESS (67%)  3.  Pt will improve Lt knee ROM to 0-115 to improve ability to perform stairs Baseline:  Goal status: IN PROGRESS (see above)  4.  Pt will improve 5x STS to <= 9 seconds to demo improved functional strength Baseline:  Goal status: MET (8 sec)  5.  Pt will sleep x 6 hours without waking due to pain Baseline:  Goal status: MET   PLAN:  PT FREQUENCY: 2x/week  PT DURATION: 6 weeks  PLANNED INTERVENTIONS: Therapeutic exercises, Therapeutic activity, Neuromuscular re-education, Balance training, Gait training, Patient/Family education, Self Care, Joint mobilization, Stair training, Aquatic Therapy, Dry Needling, Electrical stimulation, Cryotherapy, Moist heat, Taping, Vasopneumatic device, Ultrasound, Ionotophoresis /ml Dexamethasone, Manual therapy, and Re-evaluation  PLAN FOR NEXT SESSION: Focus on knee flexion. Progress dynamic balance, functional knee flexion, SLB/SLS exercises; game ready as needed for swelling   Sanjuana Mae, PTA 10/07/2022, 10:24 AM

## 2022-10-09 ENCOUNTER — Other Ambulatory Visit: Payer: Self-pay | Admitting: Orthopaedic Surgery

## 2022-10-09 MED ORDER — TRAMADOL HCL 50 MG PO TABS: 50.0000 mg | ORAL_TABLET | Freq: Four times a day (QID) | ORAL | 0 refills | Status: DC | PRN

## 2022-10-14 ENCOUNTER — Ambulatory Visit: Payer: BC Managed Care – PPO

## 2022-10-14 ENCOUNTER — Other Ambulatory Visit: Payer: Self-pay | Admitting: Orthopaedic Surgery

## 2022-10-14 DIAGNOSIS — M6281 Muscle weakness (generalized): Secondary | ICD-10-CM

## 2022-10-14 DIAGNOSIS — R600 Localized edema: Secondary | ICD-10-CM

## 2022-10-14 DIAGNOSIS — R262 Difficulty in walking, not elsewhere classified: Secondary | ICD-10-CM

## 2022-10-14 DIAGNOSIS — M25562 Pain in left knee: Secondary | ICD-10-CM

## 2022-10-14 MED ORDER — TRAMADOL HCL 50 MG PO TABS: 50.0000 mg | ORAL_TABLET | Freq: Four times a day (QID) | ORAL | 0 refills | Status: DC | PRN

## 2022-10-14 NOTE — Therapy (Signed)
OUTPATIENT PHYSICAL THERAPY LOWER EXTREMITY TREATMENT   Patient Name: Krystal Kane MRN: [id number] DOB:09-Dec-1960, 62 y.o., female Today's Date: 10/14/2022  END OF SESSION:  PT End of Session - 10/14/22 0925     Visit Number 15    Date for PT Re-Evaluation 11/05/22    Authorization Type BCBS    Authorization Time Period 30 VISITS PER YEAR, 7 HAVE BEEN USED  08/18/2022    Authorization - Visit Number 15    Authorization - Number of Visits 23    PT Start Time [phone]    PT Stop Time 1018    PT Time Calculation (min) 55 min    Activity Tolerance Patient tolerated treatment well    Behavior During Therapy WFL for tasks assessed/performed               Past Medical History:  Diagnosis Date   Allergy    seasonal   Arthritis    osteoarthritis   Cancer    ductal ca in situ rt breast   Depression    Family history of adverse reaction to anesthesia    Father died from reaction to Anesthesia 1971   Fluid retention    Heart murmur    History of kidney stones    Hypertension    Personal history of radiation therapy 2003   Sweet's syndrome    Past Surgical History:  Procedure Laterality Date   ABDOMINAL HYSTERECTOMY     APPENDECTOMY     BREAST LUMPECTOMY Right 2003   BREAST SURGERY     rt lumpectomy   KNEE ARTHROSCOPY  02/2010   right   LAPAROSCOPIC APPENDECTOMY  10/2010   TOTAL KNEE ARTHROPLASTY Right 05/08/2016   Procedure: RIGHT TOTAL KNEE ARTHROPLASTY;  Surgeon: Kathryne Hitch, MD;  Location: [facility];  Service: Orthopedics;  Laterality: Right;   TOTAL KNEE ARTHROPLASTY Left 07/24/2022   Procedure: LEFT TOTAL KNEE ARTHROPLASTY;  Surgeon: Kathryne Hitch, MD;  Location: [facility];  Service: Orthopedics;  Laterality: Left;   Patient Active Problem List   Diagnosis Date Noted   Status post total left knee replacement 07/24/2022   Status post total right knee replacement 05/08/2016   Sweet's syndrome 09/15/2014   Anxiety 09/19/2011   Unilateral primary  osteoarthritis, right knee 09/19/2011   History of kidney stones 09/19/2011   Hypertension 12/15/2010   Obesity 12/15/2010   Depression 12/15/2010   History of breast cancer 12/15/2010    PCP: Pricilla Holm  REFERRING PROVIDER: Doneen Poisson  REFERRING DIAG: Lt TKA  THERAPY DIAG:  Acute pain of left knee  Difficulty in walking, not elsewhere classified  Muscle weakness (generalized)  Localized edema  Rationale for Evaluation and Treatment: Rehabilitation  ONSET DATE: 07/24/2022  SUBJECTIVE:   SUBJECTIVE STATEMENT: Patient reports she is sleeping much better and has not been woken up by pain for the last few days, states she is averaging 8 hours of sleep. Patient reports minimal pain this morning, states the swelling in her knee comes and goes depending on her activity level.    EVAL:  Pt is s/p Lt TKA 07/24/22. She was initially having HHPT and states her last visit was 1 week ago but she has been performing HEP. She states she still has pain and wakes up in the night due to pain. Once she takes meds she feels better. She is walking without a device. Pt is able to negotiate stairs to enter home and is able to live on 1 level. She states she  feels her muscles are "tight" and she gets "knots" in her muscles which cause pain. Pt continues to manage pain with ice and pain meds.  PERTINENT HISTORY: Rt TKA 2017 PAIN:  Are you having pain? Yes: NPRS scale: 2/10 currently worse at night/10 Pain location: Lt knee Pain description: sore, achey Aggravating factors: standing and walking after prolonged sitting Relieving factors: ice,meds  PRECAUTIONS: None  WEIGHT BEARING RESTRICTIONS: No  FALLS:  Has patient fallen in last 6 months? No  PATIENT GOALS: decrease pain, return to "normal" walking  NEXT MD VISIT: July 2024  OBJECTIVE:  (Measures in this section from initial evaluation unless otherwise noted) PATIENT SURVEYS:  FOTO 59  (eval) FOTO 67 (09/24/22) FOTO  70 (10/14/22)  EDEMA:  Mild edema Lt knee  PALPATION: Steri strips in place over incision Patella mobility [facility]  LOWER EXTREMITY ROM:  Active ROM Right eval Left eval Left 09/01/22 Left 09/17/22  Left 09/24/22 Left 10/07/22  Knee flexion  90 97 104  106 108  Knee extension  -6 -4  -2    LOWER EXTREMITY MMT:  MMT Right eval Left eval Left 09/24/22  Knee flexion 4+ 4 5  Knee extension 4+ 4- 5   FUNCTIONAL TESTS:  5 times sit to stand: 10.9 seconds  GAIT: Distance walked: 100' Assistive device utilized: None Level of assistance: Complete Independence Comments: mild antalgic gait with decreased knee flexion on Lt   [facility] Adult PT Treatment:                                                DATE: 10/14/2022 Therapeutic Exercise: Recumbent bike L5 x 6 min AAROM heel slides 10x10" Seated knee flexion stretch 8x10" Stairs: reciprocal stepping pattern during descend (long staircase at hall) Gym stairs (6"): heel taps --> step down with R leg Manual Therapy: STM/IASTM peroneals/ant tib Modalities: Vaso x10 min - med compression, 34 deg    [facility] Adult PT Treatment:                                                DATE: 10/07/2022 Therapeutic Exercise: Recumbent bike L3 x 5 min AAROM heel slides 15x10" Supine HS stretch 2x30" Seated knee flexion stretch 10x5" Knee flexion stretch lunge on stairs Lateral step ups (step down R, step up L) Step down 4"-6" step Standing HS curls 3#AW 2x10 Standing hip ext 3#AW 2x10 B Slow front & side high knee marching --> added opposite knee taps Supine glute stretch   PATIENT EDUCATION:  Education details: Step down on stairs Person educated: Patient Education method: Explanation, Demonstration, and Handouts Education comprehension: verbalized understanding and returned demonstration  HOME EXERCISE PROGRAM: Access Code: [email or web address] URL: [email or web address] Date: 10/07/2022 Prepared by: Carlynn Herald  Exercises - Lateral  Step Down  - 1 x daily - 7 x weekly - 3 sets - 10 reps - Forward Step Down  - 1 x daily - 7 x weekly - 3 sets - 10 reps - Standing Knee Flexion Stretch on Step  - 1 x daily - 7 x weekly - 1 sets - 3 reps - 20-30 seconds hold - Standing Hamstring Curl with Resistance  - 1 x daily - 7 x weekly - 3 sets -  10 reps - Hip Abduction with Resistance Loop  - 1 x daily - 7 x weekly - 3 sets - 10 reps - Hip Extension with Resistance Loop  - 1 x daily - 7 x weekly - 3 sets - 10 reps - Supine ITB Stretch with Strap  - 2 x daily - 7 x weekly - 1 sets - 3-5 reps - 30 sec hold - Prone Quadriceps Set  - 1 x daily - 7 x weekly - 3 sets - 10 reps - 5 sec hold - Prone Terminal Knee Extension  - 1 x daily - 7 x weekly - 3 sets - 10 reps - 5 sec hold - Clam with Resistance  - 1 x daily - 7 x weekly - 3 sets - 10 reps - Sidelying Reverse Clamshell with Resistance  - 1 x daily - 7 x weekly - 3 sets - 10 reps - Staggered Sit-to-Stand  - 1 x daily - 7 x weekly - 3 sets - 10 reps - Seated Knee Flexion Stretch  - 1 x daily - 7 x weekly - 1 sets - 10 reps - 10 sec hold - Prone Quadriceps Stretch with Strap  - 1 x daily - 7 x weekly - 3 sets - 10 reps - Wall Squat with Ball between Knees  - 1 x daily - 7 x weekly - 3 sets - 10 reps  ASSESSMENT:  CLINICAL IMPRESSION:  Slight delay in knee flexion noted on L LE when descending stairs; mechanics improved with repetitive step down/heel taps focusing on L stabilizing leg and knee flexion. Greater knee flexion noted during seated stretch versus supine AAROM heel slides.     OBJECTIVE IMPAIRMENTS: decreased activity tolerance, decreased balance, decreased endurance, difficulty walking, decreased ROM, decreased strength, increased edema, increased muscle spasms, and pain.   GOALS: Goals reviewed with patient? Yes  SHORT TERM GOALS: Target date: 09/01/2022  Pt will be independent with initial HEP Baseline: Goal status: MET  LONG TERM GOALS: Target date: 11/05/2022  Pt  will be independent with advanced HEP Baseline:  Goal status: MET  2.  Pt will improve FOTO to >= 71 to demo improved functional mobility Baseline:  Goal status: IN PROGRESS (70%)  3.  Pt will improve Lt knee ROM to 0-115 to improve ability to perform stairs Baseline:  Goal status: IN PROGRESS (see above)  4.  Pt will improve 5x STS to <= 9 seconds to demo improved functional strength Baseline:  Goal status: MET (8 sec)  5.  Pt will sleep x 6 hours without waking due to pain Baseline:  Goal status: MET   PLAN:  PT FREQUENCY: 2x/week  PT DURATION: 6 weeks  PLANNED INTERVENTIONS: Therapeutic exercises, Therapeutic activity, Neuromuscular re-education, Balance training, Gait training, Patient/Family education, Self Care, Joint mobilization, Stair training, Aquatic Therapy, Dry Needling, Electrical stimulation, Cryotherapy, Moist heat, Taping, Vasopneumatic device, Ultrasound, Ionotophoresis /ml Dexamethasone, Manual therapy, and Re-evaluation  PLAN FOR NEXT SESSION: Focus on knee flexion. Progress dynamic balance, functional knee flexion, SLB/SLS exercises; game ready as needed for swelling   Sanjuana Mae, PTA 10/14/2022, 10:19 AM

## 2022-10-20 ENCOUNTER — Other Ambulatory Visit: Payer: Self-pay | Admitting: Physician Assistant

## 2022-10-20 MED ORDER — TRAMADOL HCL 50 MG PO TABS: 50.0000 mg | ORAL_TABLET | Freq: Four times a day (QID) | ORAL | 0 refills | Status: DC | PRN

## 2022-10-21 ENCOUNTER — Ambulatory Visit: Payer: BC Managed Care – PPO | Attending: Orthopaedic Surgery

## 2022-10-21 DIAGNOSIS — M6281 Muscle weakness (generalized): Secondary | ICD-10-CM | POA: Diagnosis present

## 2022-10-21 DIAGNOSIS — R600 Localized edema: Secondary | ICD-10-CM | POA: Diagnosis present

## 2022-10-21 DIAGNOSIS — M25562 Pain in left knee: Secondary | ICD-10-CM | POA: Insufficient documentation

## 2022-10-21 DIAGNOSIS — R262 Difficulty in walking, not elsewhere classified: Secondary | ICD-10-CM | POA: Insufficient documentation

## 2022-10-21 NOTE — Therapy (Signed)
OUTPATIENT PHYSICAL THERAPY LOWER EXTREMITY TREATMENT   Patient Name: Krystal Kane MRN: [id number] DOB:04-25-1961, 62 y.o., female Today's Date: 10/21/2022  END OF SESSION:  PT End of Session - 10/21/22 0928     Visit Number 16    Date for PT Re-Evaluation 11/05/22    Authorization Type BCBS    Authorization Time Period 30 VISITS PER YEAR, 7 HAVE BEEN USED  08/18/2022    Authorization - Visit Number 16    Authorization - Number of Visits 23    PT Start Time 0926    PT Stop Time 1022    PT Time Calculation (min) 56 min    Activity Tolerance Patient tolerated treatment well    Behavior During Therapy WFL for tasks assessed/performed               Past Medical History:  Diagnosis Date   Allergy    seasonal   Arthritis    osteoarthritis   Cancer (HCC)    ductal ca in situ rt breast   Depression    Family history of adverse reaction to anesthesia    Father died from reaction to Anesthesia 1971   Fluid retention    Heart murmur    History of kidney stones    Hypertension    Personal history of radiation therapy 2003   Sweet's syndrome    Past Surgical History:  Procedure Laterality Date   ABDOMINAL HYSTERECTOMY     APPENDECTOMY     BREAST LUMPECTOMY Right 2003   BREAST SURGERY     rt lumpectomy   KNEE ARTHROSCOPY  02/2010   right   LAPAROSCOPIC APPENDECTOMY  10/2010   TOTAL KNEE ARTHROPLASTY Right 05/08/2016   Procedure: RIGHT TOTAL KNEE ARTHROPLASTY;  Surgeon: Kathryne Hitch, MD;  Location: [facility];  Service: Orthopedics;  Laterality: Right;   TOTAL KNEE ARTHROPLASTY Left 07/24/2022   Procedure: LEFT TOTAL KNEE ARTHROPLASTY;  Surgeon: Kathryne Hitch, MD;  Location: [facility];  Service: Orthopedics;  Laterality: Left;   Patient Active Problem List   Diagnosis Date Noted   Status post total left knee replacement 07/24/2022   Status post total right knee replacement 05/08/2016   Sweet's syndrome 09/15/2014   Anxiety 09/19/2011   Unilateral primary  osteoarthritis, right knee 09/19/2011   History of kidney stones 09/19/2011   Hypertension 12/15/2010   Obesity 12/15/2010   Depression 12/15/2010   History of breast cancer 12/15/2010    PCP: Pricilla Holm  REFERRING PROVIDER: Doneen Poisson  REFERRING DIAG: Lt TKA  THERAPY DIAG:  Acute pain of left knee  Difficulty in walking, not elsewhere classified  Muscle weakness (generalized)  Localized edema  Rationale for Evaluation and Treatment: Rehabilitation  ONSET DATE: 07/24/2022  SUBJECTIVE:   SUBJECTIVE STATEMENT: Patient reports she feels she is moving better in her day to day activities. Patient states her sleep fluctuate with waking up to pain. Patient reports 1/10 pain today, states swelling is down some but is more swollen by end of day due to being more active in her day.    EVAL:  Pt is s/p Lt TKA 07/24/22. She was initially having HHPT and states her last visit was 1 week ago but she has been performing HEP. She states she still has pain and wakes up in the night due to pain. Once she takes meds she feels better. She is walking without a device. Pt is able to negotiate stairs to enter home and is able to live on 1 level.  She states she feels her muscles are "tight" and she gets "knots" in her muscles which cause pain. Pt continues to manage pain with ice and pain meds.  PERTINENT HISTORY: Rt TKA 2017 PAIN:  Are you having pain? Yes: NPRS scale: 2/10 currently worse at night/10 Pain location: Lt knee Pain description: sore, achey Aggravating factors: standing and walking after prolonged sitting Relieving factors: ice,meds  PRECAUTIONS: None  WEIGHT BEARING RESTRICTIONS: No  FALLS:  Has patient fallen in last 6 months? No  PATIENT GOALS: decrease pain, return to "normal" walking  NEXT MD VISIT: July 2024  OBJECTIVE:  (Measures in this section from initial evaluation unless otherwise noted) PATIENT SURVEYS:  FOTO 59  (eval) FOTO 67  (09/24/22) FOTO 70 (10/14/22)  EDEMA:  Mild edema Lt knee  PALPATION: Steri strips in place over incision Patella mobility [facility]  LOWER EXTREMITY ROM:  Active ROM Left eval Left  09/01/22 Left 09/17/22  Left 09/24/22 Left 10/07/22 Left 10/21/22  Knee flexion 90 97 104  106 108 113  Knee extension -6 -4  -2  0   LOWER EXTREMITY MMT:  MMT Right eval Left eval Left 09/24/22  Knee flexion 4+ 4 5  Knee extension 4+ 4- 5   FUNCTIONAL TESTS:  5 times sit to stand: 10.9 seconds  GAIT: Distance walked: 100' Assistive device utilized: None Level of assistance: Complete Independence Comments: mild antalgic gait with decreased knee flexion on Lt  [facility] Adult PT Treatment:                                                DATE: 10/21/2022 Therapeutic Exercise: Recumbent bike L5 x 5 min Seated knee flexion stretch  Supine heel slides with strap (see above measurement) Squat + standing heel raise  L SL step up on stepper x15 Lateral step up/down straddling stepper 3x68min Standing TKE 15# pulleys (L) 2x10x5" Knee extension measurement Leg press: DL 96#, L SL 04# Modalities: Vaso x10 min - med compression, 34 deg    [facility] Adult PT Treatment:                                                DATE: 10/14/2022 Therapeutic Exercise: Recumbent bike L5 x 6 min AAROM heel slides 10x10" Seated knee flexion stretch 8x10" Stairs: reciprocal stepping pattern during descend (long staircase at hall) Gym stairs (6"): heel taps --> step down with R leg Manual Therapy: STM/IASTM peroneals/ant tib Modalities: Vaso x10 min - med compression, 34 deg   PATIENT EDUCATION:  Education details: Step down on stairs Person educated: Patient Education method: Explanation, Demonstration, and Handouts Education comprehension: verbalized understanding and returned demonstration  HOME EXERCISE PROGRAM: Access Code: [email or web address] URL: [email or web address] Date: 10/07/2022 Prepared by: Carlynn Herald  Exercises - Lateral Step Down  - 1 x daily - 7 x weekly - 3 sets - 10 reps - Forward Step Down  - 1 x daily - 7 x weekly - 3 sets - 10 reps - Standing Knee Flexion Stretch on Step  - 1 x daily - 7 x weekly - 1 sets - 3 reps - 20-30 seconds hold - Standing Hamstring Curl with Resistance  - 1 x daily - 7 x weekly - 3  sets - 10 reps - Hip Abduction with Resistance Loop  - 1 x daily - 7 x weekly - 3 sets - 10 reps - Hip Extension with Resistance Loop  - 1 x daily - 7 x weekly - 3 sets - 10 reps - Supine ITB Stretch with Strap  - 2 x daily - 7 x weekly - 1 sets - 3-5 reps - 30 sec hold - Prone Quadriceps Set  - 1 x daily - 7 x weekly - 3 sets - 10 reps - 5 sec hold - Prone Terminal Knee Extension  - 1 x daily - 7 x weekly - 3 sets - 10 reps - 5 sec hold - Clam with Resistance  - 1 x daily - 7 x weekly - 3 sets - 10 reps - Sidelying Reverse Clamshell with Resistance  - 1 x daily - 7 x weekly - 3 sets - 10 reps - Staggered Sit-to-Stand  - 1 x daily - 7 x weekly - 3 sets - 10 reps - Seated Knee Flexion Stretch  - 1 x daily - 7 x weekly - 1 sets - 10 reps - 10 sec hold - Prone Quadriceps Stretch with Strap  - 1 x daily - 7 x weekly - 3 sets - 10 reps - Wall Squat with Ball between Knees  - 1 x daily - 7 x weekly - 3 sets - 10 reps  ASSESSMENT:  CLINICAL IMPRESSION:  Patient continues to make good progress with knee flexion ROM (see above). Continued discomfort in anterior/medial knee with transition from active knee flexion to extension.    OBJECTIVE IMPAIRMENTS: decreased activity tolerance, decreased balance, decreased endurance, difficulty walking, decreased ROM, decreased strength, increased edema, increased muscle spasms, and pain.   GOALS: Goals reviewed with patient? Yes  SHORT TERM GOALS: Target date: 09/01/2022  Pt will be independent with initial HEP Baseline: Goal status: MET  LONG TERM GOALS: Target date: 11/05/2022  Pt will be independent with advanced HEP Baseline:   Goal status: MET  2.  Pt will improve FOTO to >= 71 to demo improved functional mobility Baseline:  Goal status: IN PROGRESS (70%)  3.  Pt will improve Lt knee ROM to 0-115 to improve ability to perform stairs Baseline:  Goal status: IN PROGRESS (see above)  4.  Pt will improve 5x STS to <= 9 seconds to demo improved functional strength Baseline:  Goal status: MET (8 sec)  5.  Pt will sleep x 6 hours without waking due to pain Baseline:  Goal status: MET   PLAN:  PT FREQUENCY: 2x/week  PT DURATION: 6 weeks  PLANNED INTERVENTIONS: Therapeutic exercises, Therapeutic activity, Neuromuscular re-education, Balance training, Gait training, Patient/Family education, Self Care, Joint mobilization, Stair training, Aquatic Therapy, Dry Needling, Electrical stimulation, Cryotherapy, Moist heat, Taping, Vasopneumatic device, Ultrasound, Ionotophoresis 4mg /ml Dexamethasone, Manual therapy, and Re-evaluation  PLAN FOR NEXT SESSION: Focus on knee flexion. Progress dynamic balance, functional knee flexion, SLB/SLS exercises; game ready as needed for swelling   Sanjuana Mae, PTA 10/21/2022, 10:12 AM

## 2022-10-26 ENCOUNTER — Other Ambulatory Visit: Payer: Self-pay | Admitting: Orthopaedic Surgery

## 2022-10-26 ENCOUNTER — Encounter: Payer: Self-pay | Admitting: Orthopaedic Surgery

## 2022-10-26 ENCOUNTER — Other Ambulatory Visit: Payer: Self-pay | Admitting: Physician Assistant

## 2022-10-26 MED ORDER — TIZANIDINE HCL 4 MG PO TABS: 4.0000 mg | ORAL_TABLET | Freq: Three times a day (TID) | ORAL | 1 refills | Status: DC | PRN

## 2022-10-26 MED ORDER — TRAMADOL HCL 50 MG PO TABS: 50.0000 mg | ORAL_TABLET | Freq: Four times a day (QID) | ORAL | 0 refills | Status: DC | PRN

## 2022-10-28 ENCOUNTER — Ambulatory Visit: Payer: BC Managed Care – PPO

## 2022-10-28 DIAGNOSIS — R262 Difficulty in walking, not elsewhere classified: Secondary | ICD-10-CM

## 2022-10-28 DIAGNOSIS — M6281 Muscle weakness (generalized): Secondary | ICD-10-CM

## 2022-10-28 DIAGNOSIS — R600 Localized edema: Secondary | ICD-10-CM

## 2022-10-28 DIAGNOSIS — M25562 Pain in left knee: Secondary | ICD-10-CM

## 2022-10-28 NOTE — Therapy (Addendum)
OUTPATIENT PHYSICAL THERAPY LOWER EXTREMITY TREATMENT AND DISCHARGE  Patient Name: Krystal Kane MRN: [id number] DOB:[birth year], 61 y.o., female Today's Date: 10/28/2022  END OF SESSION:  PT End of Session - 10/28/22 1002     Visit Number 17    Date for PT Re-Evaluation 11/05/22    Authorization Type BCBS    Authorization Time Period 30 VISITS PER YEAR, 7 HAVE BEEN USED  08/18/2022    PT Start Time 0930    PT Stop Time 1005    PT Time Calculation (min) 35 min               Past Medical History:  Diagnosis Date   Allergy    seasonal   Arthritis    osteoarthritis   Cancer (HCC)    ductal ca in situ rt breast   Depression    Family history of adverse reaction to anesthesia    Father died from reaction to Anesthesia 1971   Fluid retention    Heart murmur    History of kidney stones    Hypertension    Personal history of radiation therapy 2003   Sweet's syndrome    Past Surgical History:  Procedure Laterality Date   ABDOMINAL HYSTERECTOMY     APPENDECTOMY     BREAST LUMPECTOMY Right 2003   BREAST SURGERY     rt lumpectomy   KNEE ARTHROSCOPY  02/2010   right   LAPAROSCOPIC APPENDECTOMY  10/2010   TOTAL KNEE ARTHROPLASTY Right 05/08/2016   Procedure: RIGHT TOTAL KNEE ARTHROPLASTY;  Surgeon: Kathryne Hitch, MD;  Location: [facility];  Service: Orthopedics;  Laterality: Right;   TOTAL KNEE ARTHROPLASTY Left 07/24/2022   Procedure: LEFT TOTAL KNEE ARTHROPLASTY;  Surgeon: Kathryne Hitch, MD;  Location: [facility];  Service: Orthopedics;  Laterality: Left;   Patient Active Problem List   Diagnosis Date Noted   Status post total left knee replacement 07/24/2022   Status post total right knee replacement 05/08/2016   Sweet's syndrome 09/15/2014   Anxiety 09/19/2011   Unilateral primary osteoarthritis, right knee 09/19/2011   History of kidney stones 09/19/2011   Hypertension 12/15/2010   Obesity 12/15/2010   Depression 12/15/2010   History of breast cancer  12/15/2010    PCP: Pricilla Holm  REFERRING PROVIDER: Doneen Poisson  REFERRING DIAG: Lt TKA  THERAPY DIAG:  Acute pain of left knee  Difficulty in walking, not elsewhere classified  Muscle weakness (generalized)  Localized edema  Rationale for Evaluation and Treatment: Rehabilitation  ONSET DATE: 07/24/2022  SUBJECTIVE:   SUBJECTIVE STATEMENT: Patient reports her grand daughter has to go info hernia surgery on Friday - requests if she has met all her goals to discharge after this visit due to upcoming care taking duties. Patient states she has been able to sleep the last 4 nights all the way through with no pain. Patient states she has gone to the gym 2-3 times over the past week.   EVAL:  Pt is s/p Lt TKA 07/24/22. She was initially having HHPT and states her last visit was 1 week ago but she has been performing HEP. She states she still has pain and wakes up in the night due to pain. Once she takes meds she feels better. She is walking without a device. Pt is able to negotiate stairs to enter home and is able to live on 1 level. She states she feels her muscles are "tight" and she gets "knots" in her muscles which cause pain. Pt continues  to manage pain with ice and pain meds.  PERTINENT HISTORY: Rt TKA 2017 PAIN:  Are you having pain? Yes: NPRS scale: 2/10 currently worse at night/10 Pain location: Lt knee Pain description: sore, achey Aggravating factors: standing and walking after prolonged sitting Relieving factors: ice,meds  PRECAUTIONS: None  WEIGHT BEARING RESTRICTIONS: No  FALLS:  Has patient fallen in last 6 months? No  PATIENT GOALS: decrease pain, return to "normal" walking  NEXT MD VISIT: July 2024  OBJECTIVE:  (Measures in this section from initial evaluation unless otherwise noted) PATIENT SURVEYS:  FOTO 59  (eval) FOTO 67 (09/24/22) FOTO 70 (10/14/22) FOTO  (10/28/22)  EDEMA:  Mild edema Lt knee  PALPATION: Steri strips in place over  incision Patella mobility [facility]   LOWER EXTREMITY ROM:  Active ROM Left eval Left  09/01/22 Left 09/17/22  Left 09/24/22 Left 10/07/22 Left 10/21/22 Left 10/28/22  Knee flexion 90 97 104  106 108 113 115  Knee extension -6 -4  -2  0 0   LOWER EXTREMITY MMT:  MMT Right eval Left eval Left 09/24/22  Knee flexion 4+ 4 5  Knee extension 4+ 4- 5   FUNCTIONAL TESTS:  5 times sit to stand: 10.9 seconds  GAIT: Distance walked: 100' Assistive device utilized: None Level of assistance: Complete Independence Comments: mild antalgic gait with decreased knee flexion on Lt   [facility] Adult PT Treatment:                                                DATE: 10/28/2022 Therapeutic Exercise: Recumbent bike L7 x + subjective intake Supine heel slides --> knee flexion & extension measurements (see above) Leg press: DL 161# W96 --> L SL 04# [id number] Standing TKE 15# pulleys (L) 15x5" Resisted walking: bkwd 15# pulley --> fwd 10#     [facility] Adult PT Treatment:                                                DATE: 10/21/2022 Therapeutic Exercise: Recumbent bike L5 x 5 min Seated knee flexion stretch  Supine heel slides with strap (see above measurement) Squat + standing heel raise  L SL step up on stepper x15 Lateral step up/down straddling stepper 3x48min Standing TKE 15# pulleys (L) 2x10x5" Knee extension measurement Leg press: DL 81#, L SL 19# Modalities: Vaso x10 min - med compression, 34 deg    PATIENT EDUCATION:  Education details: Walking program & gym routine Person educated: Patient Education method: Explanation, Facilities manager, and Handouts Education comprehension: verbalized understanding and returned demonstration  HOME EXERCISE PROGRAM: Access Code: [email or web address] URL: [email or web address] Date: 10/07/2022 Prepared by: Carlynn Herald  Exercises - Lateral Step Down  - 1 x daily - 7 x weekly - 3 sets - 10 reps - Forward Step Down  - 1 x daily - 7 x weekly - 3 sets - 10  reps - Standing Knee Flexion Stretch on Step  - 1 x daily - 7 x weekly - 1 sets - 3 reps - 20-30 seconds hold - Standing Hamstring Curl with Resistance  - 1 x daily - 7 x weekly - 3 sets - 10 reps - Hip Abduction with Resistance Loop  - 1 x daily -  7 x weekly - 3 sets - 10 reps - Hip Extension with Resistance Loop  - 1 x daily - 7 x weekly - 3 sets - 10 reps - Supine ITB Stretch with Strap  - 2 x daily - 7 x weekly - 1 sets - 3-5 reps - 30 sec hold - Prone Quadriceps Set  - 1 x daily - 7 x weekly - 3 sets - 10 reps - 5 sec hold - Prone Terminal Knee Extension  - 1 x daily - 7 x weekly - 3 sets - 10 reps - 5 sec hold - Clam with Resistance  - 1 x daily - 7 x weekly - 3 sets - 10 reps - Sidelying Reverse Clamshell with Resistance  - 1 x daily - 7 x weekly - 3 sets - 10 reps - Staggered Sit-to-Stand  - 1 x daily - 7 x weekly - 3 sets - 10 reps - Seated Knee Flexion Stretch  - 1 x daily - 7 x weekly - 1 sets - 10 reps - 10 sec hold - Prone Quadriceps Stretch with Strap  - 1 x daily - 7 x weekly - 3 sets - 10 reps - Wall Squat with Ball between Knees  - 1 x daily - 7 x weekly - 3 sets - 10 reps  ASSESSMENT:  CLINICAL IMPRESSION:  Patient has met all long-term goals as outlined in POC and demonstrates consistent improved L knee stability and strength. Discussion with patient on importance of implementing walking program and gym routine for long-term maintenance.  OBJECTIVE IMPAIRMENTS: decreased activity tolerance, decreased balance, decreased endurance, difficulty walking, decreased ROM, decreased strength, increased edema, increased muscle spasms, and pain.   GOALS: Goals reviewed with patient? Yes  SHORT TERM GOALS: Target date: 09/01/2022  Pt will be independent with initial HEP Baseline: Goal status: MET  LONG TERM GOALS: Target date: 11/05/2022  Pt will be independent with advanced HEP Baseline:  Goal status: MET  2.  Pt will improve FOTO to >= 71 to demo improved functional  mobility Baseline:  Goal status: IN PROGRESS (70%)  3.  Pt will improve Lt knee ROM to 0-115 to improve ability to perform stairs Baseline:  Goal status: IN PROGRESS (see above)  4.  Pt will improve 5x STS to <= 9 seconds to demo improved functional strength Baseline:  Goal status: MET (8 sec)  5.  Pt will sleep x 6 hours without waking due to pain Baseline:  Goal status: MET   PLAN:  PT FREQUENCY: 2x/week  PT DURATION: 6 weeks  PLANNED INTERVENTIONS: Therapeutic exercises, Therapeutic activity, Neuromuscular re-education, Balance training, Gait training, Patient/Family education, Self Care, Joint mobilization, Stair training, Aquatic Therapy, Dry Needling, Electrical stimulation, Cryotherapy, Moist heat, Taping, Vasopneumatic device, Ultrasound, Ionotophoresis 4mg /ml Dexamethasone, Manual therapy, and Re-evaluation  PLAN FOR NEXT SESSION: Discharge  PHYSICAL THERAPY DISCHARGE SUMMARY  Visits from Start of Care: 15  Current functional level related to goals / functional outcomes: Improved strength and ROM, decreased pain   Remaining deficits: See above   Education / Equipment: HEP   Patient agrees to discharge. Patient goals were partially met. Patient is being discharged due to being pleased with the current functional level.  Reggy Eye, PT,DPT05/01/2409:12 AM   Sanjuana Mae, PTA 10/28/2022, 10:05 AM

## 2022-10-29 ENCOUNTER — Ambulatory Visit
Admission: RE | Admit: 2022-10-29 | Discharge: 2022-10-29 | Disposition: A | Discharge status: Home / Self Care | Payer: BC Managed Care – PPO | Source: Ambulatory Visit | Attending: Family Medicine | Admitting: Family Medicine

## 2022-10-29 DIAGNOSIS — Z1231 Encounter for screening mammogram for malignant neoplasm of breast: Secondary | ICD-10-CM

## 2022-11-04 ENCOUNTER — Encounter: Payer: BC Managed Care – PPO | Admitting: Physical Therapy

## 2022-11-05 ENCOUNTER — Other Ambulatory Visit: Payer: Self-pay | Admitting: Orthopaedic Surgery

## 2022-11-05 MED ORDER — TRAMADOL HCL 50 MG PO TABS: 50.0000 mg | ORAL_TABLET | Freq: Four times a day (QID) | ORAL | 0 refills | Status: DC | PRN

## 2022-11-13 ENCOUNTER — Other Ambulatory Visit: Payer: Self-pay | Admitting: Orthopaedic Surgery

## 2022-11-13 ENCOUNTER — Encounter: Payer: Self-pay | Admitting: Orthopaedic Surgery

## 2022-11-13 MED ORDER — TIZANIDINE HCL 4 MG PO TABS: 4.0000 mg | ORAL_TABLET | Freq: Three times a day (TID) | ORAL | 1 refills | Status: DC | PRN

## 2022-11-13 MED ORDER — TRAMADOL HCL 50 MG PO TABS: 50.0000 mg | ORAL_TABLET | Freq: Four times a day (QID) | ORAL | 0 refills | Status: DC | PRN

## 2022-12-05 ENCOUNTER — Encounter: Payer: Self-pay | Admitting: Orthopaedic Surgery

## 2022-12-07 MED ORDER — TIZANIDINE HCL 4 MG PO TABS: 4.0000 mg | ORAL_TABLET | Freq: Three times a day (TID) | ORAL | 1 refills | Status: AC | PRN

## 2022-12-08 ENCOUNTER — Other Ambulatory Visit: Payer: Self-pay | Admitting: Orthopaedic Surgery

## 2022-12-28 ENCOUNTER — Other Ambulatory Visit (INDEPENDENT_AMBULATORY_CARE_PROVIDER_SITE_OTHER): Payer: BC Managed Care – PPO

## 2022-12-28 ENCOUNTER — Encounter: Payer: Self-pay | Admitting: Orthopaedic Surgery

## 2022-12-28 ENCOUNTER — Ambulatory Visit (INDEPENDENT_AMBULATORY_CARE_PROVIDER_SITE_OTHER): Payer: BC Managed Care – PPO | Admitting: Orthopaedic Surgery

## 2022-12-28 DIAGNOSIS — Z96652 Presence of left artificial knee joint: Secondary | ICD-10-CM

## 2022-12-28 NOTE — Progress Notes (Signed)
The patient comes in today 5 months out from a left total knee arthroplasty.  We replaced Krystal Kane right knee back in 2017.  She says that the left knee did much better in terms of Krystal Kane recovery.  She says both knees are doing well and she has no issues at all.  She reports good range of motion and strength.  Both knees are assessed today and are examined with excellent range of motion.  Both knees are ligamentously stable.  Both knees have full range of motion.  An AP and lateral of the left knee shows a well-seated left total knee arthroplasty with no complicating features.  Since she is doing so well follow-up for Krystal Kane knees can be as needed.  She understands if that she does develop any issues at all she should not hesitate to reach out to [place].

## 2023-10-26 ENCOUNTER — Other Ambulatory Visit: Payer: Self-pay | Admitting: Family Medicine

## 2023-10-26 DIAGNOSIS — Z1231 Encounter for screening mammogram for malignant neoplasm of breast: Secondary | ICD-10-CM

## 2023-11-01 ENCOUNTER — Ambulatory Visit
Admission: RE | Admit: 2023-11-01 | Discharge: 2023-11-01 | Disposition: A | Discharge status: Home / Self Care | Source: Ambulatory Visit | Attending: Family Medicine | Admitting: Family Medicine

## 2023-11-01 DIAGNOSIS — Z1231 Encounter for screening mammogram for malignant neoplasm of breast: Secondary | ICD-10-CM
